# Patient Record
Sex: Female | Born: 1950 | Race: White | Hispanic: No | Marital: Single | Smoking: Never smoker
Health system: Southern US, Community
[De-identification: ages and names within clinical notes are randomized; demographics above are authoritative.]

## PROBLEM LIST (undated history)

## (undated) DIAGNOSIS — I10 Essential (primary) hypertension: Secondary | ICD-10-CM

## (undated) DIAGNOSIS — N80109 Endometriosis of ovary, unspecified side, unspecified depth: Secondary | ICD-10-CM

## (undated) DIAGNOSIS — T7840XA Allergy, unspecified, initial encounter: Secondary | ICD-10-CM

## (undated) DIAGNOSIS — E785 Hyperlipidemia, unspecified: Secondary | ICD-10-CM

## (undated) DIAGNOSIS — C50911 Malignant neoplasm of unspecified site of right female breast: Secondary | ICD-10-CM

## (undated) DIAGNOSIS — C801 Malignant (primary) neoplasm, unspecified: Secondary | ICD-10-CM

## (undated) DIAGNOSIS — N801 Endometriosis of ovary: Secondary | ICD-10-CM

## (undated) HISTORY — DX: Malignant (primary) neoplasm, unspecified: C80.1

## (undated) HISTORY — PX: CHOLECYSTECTOMY: SHX55

## (undated) HISTORY — DX: Endometriosis of ovary, unspecified side, unspecified depth: N80.109

## (undated) HISTORY — DX: Essential (primary) hypertension: I10

## (undated) HISTORY — DX: Malignant neoplasm of unspecified site of right female breast: C50.911

## (undated) HISTORY — PX: BREAST SURGERY: SHX581

## (undated) HISTORY — DX: Endometriosis of ovary: N80.1

## (undated) HISTORY — DX: Allergy, unspecified, initial encounter: T78.40XA

## (undated) HISTORY — PX: OOPHORECTOMY: SHX86

## (undated) HISTORY — DX: Hyperlipidemia, unspecified: E78.5

## (undated) HISTORY — PX: COLON SURGERY: SHX602

---

## 1999-11-18 ENCOUNTER — Other Ambulatory Visit: Admission: RE | Admit: 1999-11-18 | Discharge: 1999-11-18 | Payer: Self-pay | Admitting: Obstetrics and Gynecology

## 1999-11-25 ENCOUNTER — Other Ambulatory Visit: Admission: RE | Admit: 1999-11-25 | Discharge: 1999-11-25 | Payer: Self-pay | Admitting: General Surgery

## 1999-12-06 DIAGNOSIS — C50911 Malignant neoplasm of unspecified site of right female breast: Secondary | ICD-10-CM

## 1999-12-06 HISTORY — DX: Malignant neoplasm of unspecified site of right female breast: C50.911

## 1999-12-09 ENCOUNTER — Encounter (HOSPITAL_BASED_OUTPATIENT_CLINIC_OR_DEPARTMENT_OTHER): Payer: Self-pay | Admitting: General Surgery

## 1999-12-10 ENCOUNTER — Ambulatory Visit (HOSPITAL_COMMUNITY): Admission: RE | Admit: 1999-12-10 | Discharge: 1999-12-10 | Payer: Self-pay | Admitting: General Surgery

## 1999-12-10 ENCOUNTER — Encounter (INDEPENDENT_AMBULATORY_CARE_PROVIDER_SITE_OTHER): Payer: Self-pay | Admitting: *Deleted

## 1999-12-29 ENCOUNTER — Encounter (HOSPITAL_BASED_OUTPATIENT_CLINIC_OR_DEPARTMENT_OTHER): Payer: Self-pay | Admitting: General Surgery

## 1999-12-29 ENCOUNTER — Inpatient Hospital Stay (HOSPITAL_COMMUNITY): Admission: RE | Admit: 1999-12-29 | Discharge: 1999-12-30 | Payer: Self-pay | Admitting: General Surgery

## 1999-12-29 ENCOUNTER — Encounter (INDEPENDENT_AMBULATORY_CARE_PROVIDER_SITE_OTHER): Payer: Self-pay | Admitting: *Deleted

## 2000-01-14 ENCOUNTER — Encounter: Admission: RE | Admit: 2000-01-14 | Discharge: 2000-01-14 | Payer: Self-pay | Admitting: General Surgery

## 2000-02-01 ENCOUNTER — Ambulatory Visit (HOSPITAL_COMMUNITY): Admission: RE | Admit: 2000-02-01 | Discharge: 2000-02-01 | Payer: Self-pay | Admitting: Oncology

## 2000-02-01 ENCOUNTER — Encounter: Payer: Self-pay | Admitting: Oncology

## 2000-04-09 ENCOUNTER — Ambulatory Visit (HOSPITAL_COMMUNITY): Admission: RE | Admit: 2000-04-09 | Discharge: 2000-04-09 | Payer: Self-pay | Admitting: Oncology

## 2000-05-10 ENCOUNTER — Encounter: Admission: RE | Admit: 2000-05-10 | Discharge: 2000-08-08 | Payer: Self-pay | Admitting: Radiation Oncology

## 2000-06-09 ENCOUNTER — Ambulatory Visit (HOSPITAL_COMMUNITY): Admission: RE | Admit: 2000-06-09 | Discharge: 2000-06-09 | Payer: Self-pay | Admitting: General Surgery

## 2001-01-05 ENCOUNTER — Other Ambulatory Visit: Admission: RE | Admit: 2001-01-05 | Discharge: 2001-01-05 | Payer: Self-pay | Admitting: Obstetrics and Gynecology

## 2002-01-02 ENCOUNTER — Encounter: Payer: Self-pay | Admitting: Plastic Surgery

## 2002-01-04 ENCOUNTER — Inpatient Hospital Stay (HOSPITAL_COMMUNITY): Admission: RE | Admit: 2002-01-04 | Discharge: 2002-01-06 | Payer: Self-pay | Admitting: Plastic Surgery

## 2002-01-04 ENCOUNTER — Encounter (INDEPENDENT_AMBULATORY_CARE_PROVIDER_SITE_OTHER): Payer: Self-pay | Admitting: Specialist

## 2002-03-11 ENCOUNTER — Other Ambulatory Visit: Admission: RE | Admit: 2002-03-11 | Discharge: 2002-03-11 | Payer: Self-pay | Admitting: Obstetrics and Gynecology

## 2003-06-16 ENCOUNTER — Other Ambulatory Visit: Admission: RE | Admit: 2003-06-16 | Discharge: 2003-06-16 | Payer: Self-pay | Admitting: Obstetrics and Gynecology

## 2004-08-06 ENCOUNTER — Other Ambulatory Visit: Admission: RE | Admit: 2004-08-06 | Discharge: 2004-08-06 | Payer: Self-pay | Admitting: Obstetrics and Gynecology

## 2005-02-15 ENCOUNTER — Ambulatory Visit: Payer: Self-pay | Admitting: Oncology

## 2005-08-22 ENCOUNTER — Other Ambulatory Visit: Admission: RE | Admit: 2005-08-22 | Discharge: 2005-08-22 | Payer: Self-pay | Admitting: Obstetrics and Gynecology

## 2006-02-13 ENCOUNTER — Ambulatory Visit: Payer: Self-pay | Admitting: Oncology

## 2006-04-13 ENCOUNTER — Ambulatory Visit: Payer: Self-pay | Admitting: Oncology

## 2006-04-17 LAB — LIPID PANEL
HDL: 53 mg/dL (ref >39)
LDL Cholesterol: 130 mg/dL — ABNORMAL HIGH (ref 0–99)

## 2006-11-04 ENCOUNTER — Encounter: Payer: Self-pay | Admitting: Internal Medicine

## 2006-11-04 LAB — CONVERTED CEMR LAB

## 2007-02-07 ENCOUNTER — Ambulatory Visit: Payer: Self-pay | Admitting: Oncology

## 2007-02-12 LAB — CBC WITH DIFFERENTIAL/PLATELET
BASO%: 0.4 % (ref 0.0–2.0)
Eosinophils Absolute: 0.1 10*3/uL (ref 0.0–0.5)
HCT: 38 % (ref 34.8–46.6)
MCHC: 35.7 g/dL (ref 32.0–36.0)
MONO#: 0.4 10*3/uL (ref 0.1–0.9)
NEUT#: 2.7 10*3/uL (ref 1.5–6.5)
NEUT%: 55.8 % (ref 39.6–76.8)
RBC: 4.14 10*6/uL (ref 3.70–5.32)
WBC: 4.9 10*3/uL (ref 3.9–10.0)
lymph#: 1.6 10*3/uL (ref 0.9–3.3)

## 2007-02-12 LAB — COMPREHENSIVE METABOLIC PANEL
ALT: 24 U/L (ref 0–35)
CO2: 26 mEq/L (ref 19–32)
Calcium: 9.7 mg/dL (ref 8.4–10.5)
Chloride: 104 mEq/L (ref 96–112)
Sodium: 141 mEq/L (ref 135–145)
Total Protein: 7.4 g/dL (ref 6.0–8.3)

## 2007-02-12 LAB — LACTATE DEHYDROGENASE: LDH: 153 U/L (ref 94–250)

## 2007-04-17 ENCOUNTER — Ambulatory Visit: Payer: Self-pay | Admitting: Internal Medicine

## 2007-05-03 ENCOUNTER — Encounter: Payer: Self-pay | Admitting: Internal Medicine

## 2007-05-03 DIAGNOSIS — F4320 Adjustment disorder, unspecified: Secondary | ICD-10-CM | POA: Insufficient documentation

## 2007-05-03 DIAGNOSIS — I10 Essential (primary) hypertension: Secondary | ICD-10-CM

## 2007-05-03 DIAGNOSIS — E785 Hyperlipidemia, unspecified: Secondary | ICD-10-CM

## 2007-05-03 DIAGNOSIS — R739 Hyperglycemia, unspecified: Secondary | ICD-10-CM

## 2007-05-03 DIAGNOSIS — Z853 Personal history of malignant neoplasm of breast: Secondary | ICD-10-CM

## 2007-06-12 ENCOUNTER — Ambulatory Visit: Payer: Self-pay | Admitting: Internal Medicine

## 2007-06-12 LAB — CONVERTED CEMR LAB
ALT: 29 units/L (ref 0–35)
AST: 28 units/L (ref 0–37)
Albumin: 3.9 g/dL (ref 3.5–5.2)
Alkaline Phosphatase: 56 units/L (ref 39–117)
BUN: 14 mg/dL (ref 6–23)
Basophils Absolute: 0 10*3/uL (ref 0.0–0.1)
Basophils Relative: 0.8 % (ref 0.0–1.0)
Bilirubin, Direct: 0.1 mg/dL (ref 0.0–0.3)
CO2: 30 meq/L (ref 19–32)
Calcium: 9.4 mg/dL (ref 8.4–10.5)
Chloride: 106 meq/L (ref 96–112)
Cholesterol: 233 mg/dL (ref 0–200)
Creatinine, Ser: 0.8 mg/dL (ref 0.4–1.2)
Direct LDL: 157.3 mg/dL
Eosinophils Absolute: 0.3 10*3/uL (ref 0.0–0.6)
Eosinophils Relative: 5.6 % — ABNORMAL HIGH (ref 0.0–5.0)
GFR calc Af Amer: 95 mL/min
GFR calc non Af Amer: 79 mL/min
Glucose, Bld: 98 mg/dL (ref 70–99)
HCT: 36.8 % (ref 36.0–46.0)
HDL: 45.1 mg/dL (ref >39.0)
Hemoglobin: 13.2 g/dL (ref 12.0–15.0)
Hgb A1c MFr Bld: 5.6 % (ref 4.6–6.0)
Lymphocytes Relative: 29.2 % (ref 12.0–46.0)
MCHC: 35.9 g/dL (ref 30.0–36.0)
MCV: 93.6 fL (ref 78.0–100.0)
Monocytes Absolute: 0.4 10*3/uL (ref 0.2–0.7)
Monocytes Relative: 9.1 % (ref 3.0–11.0)
Neutro Abs: 2.7 10*3/uL (ref 1.4–7.7)
Neutrophils Relative %: 55.3 % (ref 43.0–77.0)
Platelets: 245 10*3/uL (ref 150–400)
Potassium: 3.7 meq/L (ref 3.5–5.1)
RBC: 3.93 M/uL (ref 3.87–5.11)
RDW: 12.9 % (ref 11.5–14.6)
Sodium: 141 meq/L (ref 135–145)
TSH: 1.9 microintl units/mL (ref 0.35–5.50)
Total Bilirubin: 0.9 mg/dL (ref 0.3–1.2)
Total CHOL/HDL Ratio: 5.2
Total Protein: 7.1 g/dL (ref 6.0–8.3)
Triglycerides: 159 mg/dL — ABNORMAL HIGH (ref 0–149)
VLDL: 32 mg/dL (ref 0–40)
Vit D, 1,25-Dihydroxy: 14 — ABNORMAL LOW (ref 20–57)
WBC: 4.8 10*3/uL (ref 4.5–10.5)

## 2007-06-25 ENCOUNTER — Ambulatory Visit: Payer: Self-pay | Admitting: Internal Medicine

## 2007-06-25 DIAGNOSIS — E559 Vitamin D deficiency, unspecified: Secondary | ICD-10-CM | POA: Insufficient documentation

## 2007-09-10 ENCOUNTER — Encounter: Payer: Self-pay | Admitting: Internal Medicine

## 2007-09-21 ENCOUNTER — Ambulatory Visit: Payer: Self-pay | Admitting: Internal Medicine

## 2007-09-21 LAB — CONVERTED CEMR LAB
Cholesterol: 235 mg/dL (ref 0–200)
Direct LDL: 167.6 mg/dL
HDL: 39 mg/dL (ref >39.0)
Total CHOL/HDL Ratio: 6
Triglycerides: 208 mg/dL (ref 0–149)
VLDL: 42 mg/dL — ABNORMAL HIGH (ref 0–40)

## 2007-09-27 ENCOUNTER — Ambulatory Visit: Payer: Self-pay | Admitting: Internal Medicine

## 2008-01-02 ENCOUNTER — Ambulatory Visit: Payer: Self-pay | Admitting: Internal Medicine

## 2008-01-06 LAB — CONVERTED CEMR LAB
ALT: 23 units/L (ref 0–35)
Alkaline Phosphatase: 58 units/L (ref 39–117)
BUN: 13 mg/dL (ref 6–23)
CO2: 30 meq/L (ref 19–32)
Calcium: 9.9 mg/dL (ref 8.4–10.5)
GFR calc Af Amer: 83 mL/min
GFR calc non Af Amer: 69 mL/min
HDL: 50 mg/dL (ref >39.0)
Potassium: 5.3 meq/L — ABNORMAL HIGH (ref 3.5–5.1)
Total CHOL/HDL Ratio: 5
Total Protein: 6.8 g/dL (ref 6.0–8.3)
Triglycerides: 127 mg/dL (ref 0–149)
VLDL: 25 mg/dL (ref 0–40)

## 2008-01-10 ENCOUNTER — Ambulatory Visit: Payer: Self-pay | Admitting: Internal Medicine

## 2008-01-10 LAB — CONVERTED CEMR LAB
Cholesterol, target level: 200 mg/dL
HDL goal, serum: 40 mg/dL
LDL Goal: 130 mg/dL

## 2008-02-15 ENCOUNTER — Ambulatory Visit: Payer: Self-pay | Admitting: Oncology

## 2008-02-19 ENCOUNTER — Encounter: Payer: Self-pay | Admitting: Internal Medicine

## 2008-02-19 LAB — COMPREHENSIVE METABOLIC PANEL
ALT: 20 U/L (ref 0–35)
AST: 20 U/L (ref 0–37)
CO2: 26 mEq/L (ref 19–32)
Calcium: 9.4 mg/dL (ref 8.4–10.5)
Chloride: 104 mEq/L (ref 96–112)
Creatinine, Ser: 0.85 mg/dL (ref 0.40–1.20)
Potassium: 4.1 mEq/L (ref 3.5–5.3)
Sodium: 137 mEq/L (ref 135–145)
Total Protein: 7.2 g/dL (ref 6.0–8.3)

## 2008-02-19 LAB — CBC WITH DIFFERENTIAL/PLATELET
BASO%: 0.1 % (ref 0.0–2.0)
EOS%: 3.1 % (ref 0.0–7.0)
MCH: 32.2 pg (ref 26.0–34.0)
MCHC: 34.6 g/dL (ref 32.0–36.0)
MONO#: 0.5 10*3/uL (ref 0.1–0.9)
RBC: 4.08 10*6/uL (ref 3.70–5.32)
RDW: 14.1 % (ref 11.3–14.5)
WBC: 4.9 10*3/uL (ref 3.9–10.0)
lymph#: 1.6 10*3/uL (ref 0.9–3.3)

## 2008-02-25 ENCOUNTER — Encounter: Payer: Self-pay | Admitting: Internal Medicine

## 2008-04-17 ENCOUNTER — Ambulatory Visit: Payer: Self-pay | Admitting: Internal Medicine

## 2008-04-17 LAB — CONVERTED CEMR LAB
BUN: 12 mg/dL (ref 6–23)
CO2: 30 meq/L (ref 19–32)
Calcium: 9.3 mg/dL (ref 8.4–10.5)
Direct LDL: 152.5 mg/dL
Glucose, Bld: 98 mg/dL (ref 70–99)
Potassium: 3.6 meq/L (ref 3.5–5.1)
Sodium: 141 meq/L (ref 135–145)
Triglycerides: 153 mg/dL — ABNORMAL HIGH (ref 0–149)

## 2008-04-21 LAB — CONVERTED CEMR LAB: Vit D, 1,25-Dihydroxy: 19 — ABNORMAL LOW (ref 30–89)

## 2008-04-29 ENCOUNTER — Ambulatory Visit: Payer: Self-pay | Admitting: Internal Medicine

## 2008-11-03 ENCOUNTER — Ambulatory Visit: Payer: Self-pay | Admitting: Internal Medicine

## 2008-11-03 LAB — CONVERTED CEMR LAB
ALT: 22 units/L (ref 0–35)
Direct LDL: 122.2 mg/dL
HDL: 65 mg/dL (ref >39.0)
Total Protein: 6.8 g/dL (ref 6.0–8.3)
Triglycerides: 78 mg/dL (ref 0–149)
VLDL: 16 mg/dL (ref 0–40)
Vit D, 1,25-Dihydroxy: 25 — ABNORMAL LOW (ref 30–89)

## 2008-11-10 ENCOUNTER — Ambulatory Visit: Payer: Self-pay | Admitting: Internal Medicine

## 2008-12-08 ENCOUNTER — Telehealth: Payer: Self-pay | Admitting: Internal Medicine

## 2008-12-08 ENCOUNTER — Ambulatory Visit: Payer: Self-pay | Admitting: Family Medicine

## 2008-12-08 DIAGNOSIS — J309 Allergic rhinitis, unspecified: Secondary | ICD-10-CM | POA: Insufficient documentation

## 2008-12-08 DIAGNOSIS — J45909 Unspecified asthma, uncomplicated: Secondary | ICD-10-CM | POA: Insufficient documentation

## 2008-12-16 ENCOUNTER — Ambulatory Visit: Payer: Self-pay | Admitting: Internal Medicine

## 2008-12-16 DIAGNOSIS — J019 Acute sinusitis, unspecified: Secondary | ICD-10-CM

## 2008-12-16 DIAGNOSIS — H66019 Acute suppurative otitis media with spontaneous rupture of ear drum, unspecified ear: Secondary | ICD-10-CM

## 2008-12-16 DIAGNOSIS — T700XXA Otitic barotrauma, initial encounter: Secondary | ICD-10-CM

## 2008-12-22 ENCOUNTER — Telehealth: Payer: Self-pay | Admitting: Internal Medicine

## 2009-01-19 ENCOUNTER — Encounter: Payer: Self-pay | Admitting: Internal Medicine

## 2009-02-12 ENCOUNTER — Ambulatory Visit: Payer: Self-pay | Admitting: Oncology

## 2009-02-16 ENCOUNTER — Encounter: Payer: Self-pay | Admitting: Internal Medicine

## 2009-02-16 ENCOUNTER — Encounter: Payer: Self-pay | Admitting: *Deleted

## 2009-02-16 LAB — CBC WITH DIFFERENTIAL/PLATELET
EOS%: 2.2 % (ref 0.0–7.0)
Eosinophils Absolute: 0.1 10*3/uL (ref 0.0–0.5)
LYMPH%: 38.4 % (ref 14.0–49.7)
MCH: 33.4 pg (ref 25.1–34.0)
MCHC: 34.7 g/dL (ref 31.5–36.0)
MCV: 96.4 fL (ref 79.5–101.0)
MONO%: 8 % (ref 0.0–14.0)
NEUT#: 2.5 10*3/uL (ref 1.5–6.5)
Platelets: 199 10*3/uL (ref 145–400)
RBC: 3.9 10*6/uL (ref 3.70–5.45)

## 2009-02-16 LAB — COMPREHENSIVE METABOLIC PANEL
AST: 24 U/L (ref 0–37)
Alkaline Phosphatase: 56 U/L (ref 39–117)
Glucose, Bld: 124 mg/dL — ABNORMAL HIGH (ref 70–99)
Sodium: 139 mEq/L (ref 135–145)
Total Bilirubin: 0.5 mg/dL (ref 0.3–1.2)
Total Protein: 6.7 g/dL (ref 6.0–8.3)

## 2009-02-16 LAB — CONVERTED CEMR LAB: HCT: 37.6 %

## 2009-02-23 ENCOUNTER — Encounter: Payer: Self-pay | Admitting: Internal Medicine

## 2009-03-13 ENCOUNTER — Encounter: Payer: Self-pay | Admitting: *Deleted

## 2009-05-27 ENCOUNTER — Telehealth: Payer: Self-pay | Admitting: Internal Medicine

## 2009-06-03 ENCOUNTER — Ambulatory Visit: Payer: Self-pay | Admitting: Internal Medicine

## 2009-06-03 LAB — CONVERTED CEMR LAB
AST: 32 units/L (ref 0–37)
Alkaline Phosphatase: 55 units/L (ref 39–117)
BUN: 13 mg/dL (ref 6–23)
Bilirubin, Direct: 0 mg/dL (ref 0.0–0.3)
CO2: 27 meq/L (ref 19–32)
Chloride: 107 meq/L (ref 96–112)
Creatinine, Ser: 0.9 mg/dL (ref 0.4–1.2)
Direct LDL: 143.3 mg/dL
Glucose, Bld: 118 mg/dL — ABNORMAL HIGH (ref 70–99)
TSH: 1.7 microintl units/mL (ref 0.35–5.50)
Total Bilirubin: 0.9 mg/dL (ref 0.3–1.2)
Total CHOL/HDL Ratio: 4
Triglycerides: 105 mg/dL (ref 0.0–149.0)

## 2009-06-25 ENCOUNTER — Ambulatory Visit: Payer: Self-pay | Admitting: Internal Medicine

## 2009-09-14 ENCOUNTER — Encounter: Payer: Self-pay | Admitting: Internal Medicine

## 2009-12-17 ENCOUNTER — Ambulatory Visit: Payer: Self-pay | Admitting: Internal Medicine

## 2009-12-17 LAB — CONVERTED CEMR LAB
Albumin: 4 g/dL (ref 3.5–5.2)
Basophils Relative: 0.5 % (ref 0.0–3.0)
Bilirubin Urine: NEGATIVE
CO2: 26 meq/L (ref 19–32)
CRP, High Sensitivity: 0.6 (ref 0.00–5.00)
Chloride: 103 meq/L (ref 96–112)
Cholesterol: 213 mg/dL — ABNORMAL HIGH (ref 0–200)
Creatinine, Ser: 0.9 mg/dL (ref 0.4–1.2)
Direct LDL: 132 mg/dL
Eosinophils Absolute: 0.2 10*3/uL (ref 0.0–0.7)
HCT: 37.3 % (ref 36.0–46.0)
Hemoglobin: 12.5 g/dL (ref 12.0–15.0)
Lymphs Abs: 1.9 10*3/uL (ref 0.7–4.0)
MCHC: 33.5 g/dL (ref 30.0–36.0)
MCV: 99.7 fL (ref 78.0–100.0)
Monocytes Absolute: 0.4 10*3/uL (ref 0.1–1.0)
Neutro Abs: 2.6 10*3/uL (ref 1.4–7.7)
RBC: 3.74 M/uL — ABNORMAL LOW (ref 3.87–5.11)
Sodium: 136 meq/L (ref 135–145)
Total CHOL/HDL Ratio: 3
Total Protein: 7 g/dL (ref 6.0–8.3)
Urobilinogen, UA: 0.2

## 2009-12-29 ENCOUNTER — Ambulatory Visit: Payer: Self-pay | Admitting: Internal Medicine

## 2009-12-29 ENCOUNTER — Other Ambulatory Visit: Admission: RE | Admit: 2009-12-29 | Discharge: 2009-12-29 | Payer: Self-pay | Admitting: Internal Medicine

## 2010-02-18 ENCOUNTER — Ambulatory Visit: Payer: Self-pay | Admitting: Oncology

## 2010-02-22 ENCOUNTER — Encounter: Payer: Self-pay | Admitting: Internal Medicine

## 2010-02-22 LAB — COMPREHENSIVE METABOLIC PANEL
ALT: 27 U/L (ref 0–35)
Albumin: 4.1 g/dL (ref 3.5–5.2)
Alkaline Phosphatase: 52 U/L (ref 39–117)
CO2: 25 mEq/L (ref 19–32)
Glucose, Bld: 101 mg/dL — ABNORMAL HIGH (ref 70–99)
Potassium: 4 mEq/L (ref 3.5–5.3)
Sodium: 137 mEq/L (ref 135–145)
Total Protein: 6.7 g/dL (ref 6.0–8.3)

## 2010-02-22 LAB — CBC WITH DIFFERENTIAL/PLATELET
BASO%: 0.4 % (ref 0.0–2.0)
Eosinophils Absolute: 0.1 10*3/uL (ref 0.0–0.5)
MCHC: 35.3 g/dL (ref 31.5–36.0)
MONO#: 0.4 10*3/uL (ref 0.1–0.9)
NEUT#: 2.3 10*3/uL (ref 1.5–6.5)
RBC: 3.71 10*6/uL (ref 3.70–5.45)
RDW: 13.7 % (ref 11.2–14.5)
WBC: 4.6 10*3/uL (ref 3.9–10.3)

## 2010-02-22 LAB — LACTATE DEHYDROGENASE: LDH: 169 U/L (ref 94–250)

## 2010-03-01 ENCOUNTER — Encounter: Payer: Self-pay | Admitting: Internal Medicine

## 2010-08-10 ENCOUNTER — Telehealth: Payer: Self-pay | Admitting: *Deleted

## 2010-09-15 ENCOUNTER — Encounter: Payer: Self-pay | Admitting: Internal Medicine

## 2010-11-09 ENCOUNTER — Telehealth: Payer: Self-pay | Admitting: *Deleted

## 2011-01-04 NOTE — Assessment & Plan Note (Signed)
Summary: cpx/mm   Vital Signs:  Patient profile:   60 year old female Menstrual status:  postmenopausal Height:      65.5 inches Weight:      145 pounds Pulse rate:   88 / minute BP sitting:   130 / 80  (left arm) Cuff size:   regular  Vitals Entered By: Romualdo Bolk, CMA (AAMA) (December 29, 2009 9:22 AM) CC: CPX- Discuss doing a pap   History of Present Illness: Ashley Hurley comes in today for   preventive visit .   Since last visit  here  there have been no major changes in health status    Mood Good  LIPIDS:  no se of niaspan. HT controlled without se of meds. Vit d  On high dose because low when not on this    no se .   Resp: no sig resp problem  Preventive Care Screening  Mammogram:    Date:  09/04/2009    Results:  normal    Preventive Screening-Counseling & Management  Alcohol-Tobacco     Alcohol drinks/day: <1     Alcohol type: wine     Smoking Status: never  Caffeine-Diet-Exercise     Caffeine use/day: 0     Does Patient Exercise: yes     Type of exercise: walk, wts  Hep-HIV-STD-Contraception     Sun Exposure-Excessive: no  Safety-Violence-Falls     Seat Belt Use: yes     Firearms in the Home: no firearms in the home     Smoke Detectors: yes  Contraindications/Deferment of Procedures/Staging:    Test/Procedure: Colonoscopy    Reason for deferment: patient declined   Current Medications (verified): 1)  Lexapro 20 Mg Tabs (Escitalopram Oxalate) .Marland Kitchen.. 1 By Mouth Once Daily Dr. Wallace Cullens 2)  Prinzide 10-12.5 Mg Tabs (Lisinopril-Hydrochlorothiazide) .Marland Kitchen.. 1 By Mouth Once Daily 3)  Co Q-10 120 Mg  Caps (Coenzyme Q10) .... Two Qam and One Hs 4)  Garlic 400 Mg  Tbec (Garlic) .... Once Daily 5)  Calcium 600 1500 Mg  Tabs (Calcium Carbonate) .... Once Daily 6)  Multivitamins   Tabs (Multiple Vitamin) .... Once Daily 7)  Niaspan 500 Mg  Tbcr (Niacin (Antihyperlipidemic)) .... 3  By Mouth Hs or As Directed 8)  Drisdol 09811 Unit  Caps (Ergocalciferol)  .Marland Kitchen.. 1 By Mouth Q Week  or As Directed  Allergies (verified): 1)  ! Asa  Past History:  Past medical, surgical, family and social histories (including risk factors) reviewed, and no changes noted (except as noted below).  Past Medical History: Reviewed history from 12/16/2008 and no changes required. Hyperlipidemia Hypertension Breast cancer, hx of r mastectomy radiation and chemo  IIBT3N0 Jan 2001 "Chocolate Cyst" g2p2  [facility] Allergic rhinitis  Consults Dr. Marlena Clipper   Past Surgical History: Reviewed history from 11/10/2008 and no changes required. Mastectomy r Oophorectomy Breast Biopsy   Past History:  Care Management: OB/Gyn: [facility]-Hasn't seen in a few years Hematology/Oncology: [facility]  Family History: Reviewed history from 12/16/2008 and no changes required. Family History Breast cancer 1st degree relative <50 sis Family History of CAD Female 1st degree relative <60 mom d 52 Family History Diabetes 1st degree relative mom Family History Hypertension Family History Other cancer pancreas father 82  father took a statin     Social History: Reviewed history from 12/16/2008 and no changes required. 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   Physical  Exam General Appearance: well developed, well nourished, no acute distress Eyes: conjunctiva and lids normal, PERRLA, EOMI,  WNL Ears, Nose, Mouth, Throat: TM clear, nares clear, oral exam WNL Neck: supple, no lymphadenopathy, no thyromegaly, no JVD Respiratory: clear to auscultation and percussion, respiratory effort normal Cardiovascular: regular rate and rhythm, S1-S2, no murmur, rub or gallop, no bruits, peripheral pulses normal and symmetric, no cyanosis, clubbing, edema or varicosities Chest: right breast  with old skin changes and reconstruction  no adenopathy , no masses tendderness nipple discharge   Gastrointestinal: soft, non-tender; no hepatosplenomegaly, masses; active bowel sounds all quadrants, well healead scars.  guaiac negative stool; no masses, tenderness.    tags  of  hemorrhoids  Genitourinary: no vaginal discharge, lesions; no masses or tenderness no masses  Lymphatic: no cervical, axillary or inguinal adenopathy Musculoskeletal: gait normal, muscle tone and strength WNL, no joint swelling, effusions, discoloration, crepitus  Skin: clear, good turgor, color WNL, no rashes, lesions, or ulcerations Neurologic: normal mental status, normal reflexes, normal strength, sensation, and motion Psychiatric: alert; oriented to person, place and time Other Exam:  EKG NSR     Impression & Recommendations:  Problem # 1:  HEALTH MAINTENANCE EXAM, ADULT (ICD-V70.0)  continue  healthy lifestyle   counseled about colonoscopy  declined for now  stool card given.  Orders: EKG w/ Interpretation (93000)  Problem # 2:  ROUTINE GYNECOLOGICAL EXAM (ICD-V72.31)  PAP done   Orders: Pap Smear, Thin Prep ( Collection of) (Z3086)  Problem # 3:  HYPERTENSION (ICD-401.9)  Her updated medication list for this problem includes:    Prinzide 10-12.5 Mg Tabs (Lisinopril-hydrochlorothiazide) .Marland Kitchen... 1 by mouth once daily  BP today: 130/80 Prior BP: 100/74 (06/25/2009)  Prior 10 Yr Risk Heart  Disease: Not enough information (01/10/2008)  Labs Reviewed: K+: 5.0 (12/17/2009) Creat: : 0.9 (12/17/2009)   Chol: 213 (12/17/2009)   HDL: 63.20 (12/17/2009)   LDL: DEL (11/03/2008)   TG: 83.0 (12/17/2009)  Orders: EKG w/ Interpretation (93000)  Problem # 4:  HYPERLIPIDEMIA (ICD-272.4)  Her updated medication list for this problem includes:    Niaspan 500 Mg Tbcr (Niacin (antihyperlipidemic)) .Marland KitchenMarland KitchenMarland KitchenMarland Kitchen 3  by mouth hs or as directed  Labs Reviewed: SGOT: 27 (12/17/2009)   SGPT: 29 (12/17/2009)  Lipid Goals: Chol Goal: 200 (01/10/2008)   HDL Goal: 40 (01/10/2008)   LDL Goal: 130 (01/10/2008)   TG Goal: 150 (01/10/2008)  Prior 10 Yr Risk Heart Disease: Not enough information (01/10/2008)   HDL:63.20 (12/17/2009), 56.50 (06/03/2009)  LDL:DEL (11/03/2008), DEL (04/17/2008)  Chol:213 (12/17/2009), 223 (06/03/2009)  Trig:83.0 (12/17/2009), 105.0 (06/03/2009)  Orders: EKG w/ Interpretation (93000)  Problem # 5:  BREAST CANCER, HX OF (ICD-V10.3) Assessment: Comment Only  Problem # 6:  borderline  bg  disc about niaspan  ok to continu.   Complete Medication List: 1)  Lexapro 20 Mg Tabs (Escitalopram oxalate) .Marland Kitchen.. 1 by mouth once daily dr. Wallace Cullens 2)  Prinzide 10-12.5 Mg Tabs (Lisinopril-hydrochlorothiazide) .Marland Kitchen.. 1 by mouth once daily 3)  Co Q-10 120 Mg  Caps (Coenzyme q10) .... Two qam and one hs 4)  Garlic 400 Mg Tbec (Garlic) .... Once daily 5)  Calcium 600 1500 Mg Tabs (Calcium carbonate) .... Once daily 6)  Multivitamins Tabs (Multiple vitamin) .... Once daily 7)  Niaspan 500 Mg Tbcr (Niacin (antihyperlipidemic)) .... 3  by mouth hs or as directed 8)  Drisdol 16109 Unit Caps (Ergocalciferol) .Marland Kitchen.. 1 by mouth q week  or as directed  Other Orders: Admin 1st Vaccine (60454) Flu Vaccine 20yrs + (09811) Tdap => 11yrs IM (91478) Admin of Any Addtl Vaccine (29562)  Patient Instructions: 1)  continue lifestyle intervention . 2)  EKG is normal  3)  Rec colonoscopy but at least do stool  cards for screeniing.  4)  rov in 1 year with labs or as needed.     Flu Vaccine Consent Questions     Do you have a history of severe allergic reactions to this vaccine? no    Any prior history of allergic reactions to egg and/or gelatin? no    Do you have a sensitivity to the preservative Thimersol? no    Do you have a past history of Guillan-Barre Syndrome? no    Do you currently have an acute febrile illness? no    Have you ever had a severe reaction to latex? no    Vaccine information given and explained to patient? yes    Are you currently pregnant? no    Lot Number:[id number]   Exp Date:06/03/2010   Site Given  Left Deltoid IMbflu Romualdo Bolk, CMA (AAMA)  December 29, 2009 9:27 AM   Immunizations Administered:  Tetanus Vaccine:    Vaccine Type: Tdap    Site: left deltoid    Mfr: GlaxoSmithKline    Dose: 0.5 ml    Route: IM    Given by: Romualdo Bolk, CMA (AAMA)    Exp. Date: 01/30/2012    Lot #: [id number]

## 2011-01-04 NOTE — Letter (Signed)
[facility]   Imported By: Maryln Gottron 03/18/2010 14:58:22  _____________________________________________________________________  External Attachment:    Type:   Image     Comment:   External Document

## 2011-01-04 NOTE — Progress Notes (Signed)
Summary: refill  Phone Note From Pharmacy   Caller: [facility]  [address] [phone]* Reason for Call: Needs renewal Details for Reason: lexapro Initial call taken by: Romualdo Bolk, CMA [facility]),  November 09, 2010 4:45 PM  Follow-up for Phone Call        rx sent to pharmacy Follow-up by: Romualdo Bolk, CMA [facility]),  November 09, 2010 4:45 PM    Prescriptions: LEXAPRO 20 MG TABS (ESCITALOPRAM OXALATE) 1 by mouth once daily Dr. Wallace Cullens  #30 x 0   Entered by:   Romualdo Bolk, CMA (AAMA)   Authorized by:   Madelin Headings MD   Signed by:   Romualdo Bolk, CMA (AAMA) on 11/09/2010   Method used:   [facility]ronically to        [facility]  [address] [phone]* (retail)       [address]       [place], [place]  [address]       Ph: [phone] or [phone]       Fax: [phone]   RxID:   [id number]

## 2011-01-04 NOTE — Progress Notes (Signed)
  Phone Note Call from Patient   Caller: Patient Call For: Madelin Headings MD Summary of Call: Pt is asking for Lexapro 20 mg [phone] Initial call taken by: Lynann Beaver CMA,  August 10, 2010 10:56 AM  Follow-up for Phone Call        ok x 6  Follow-up by: Madelin Headings MD,  August 10, 2010 11:03 AM  Additional Follow-up for Phone Call Additional follow up Details #1::        Rx sent to pharmacy Additional Follow-up by: Romualdo Bolk, CMA [facility]),  August 10, 2010 12:34 PM    Prescriptions: LEXAPRO 20 MG TABS (ESCITALOPRAM OXALATE) 1 by mouth once daily Dr. Wallace Cullens  #30 x 5   Entered and Authorized by:   Madelin Headings MD   Signed by:   Madelin Headings MD on 08/10/2010   Method used:   [facility]ically [facility]        [facility]  [address] [phone]* (retail)       [address]       [place], [place]  [address]       Ph: [phone] or [phone]       Fax: [phone]   RxID:   [id number]

## 2011-01-06 ENCOUNTER — Other Ambulatory Visit: Payer: Self-pay | Admitting: *Deleted

## 2011-01-06 DIAGNOSIS — E785 Hyperlipidemia, unspecified: Secondary | ICD-10-CM

## 2011-01-06 MED ORDER — NIACIN ER (ANTIHYPERLIPIDEMIC) 500 MG PO TBCR: EXTENDED_RELEASE_TABLET | ORAL | Status: DC

## 2011-01-06 NOTE — Telephone Encounter (Signed)
Rx called in 

## 2011-01-10 ENCOUNTER — Encounter: Payer: Self-pay | Admitting: Internal Medicine

## 2011-01-24 ENCOUNTER — Other Ambulatory Visit: Payer: Self-pay | Admitting: Internal Medicine

## 2011-01-24 ENCOUNTER — Encounter (INDEPENDENT_AMBULATORY_CARE_PROVIDER_SITE_OTHER): Payer: Self-pay | Admitting: *Deleted

## 2011-01-24 ENCOUNTER — Other Ambulatory Visit: Payer: BC Managed Care – PPO

## 2011-01-24 DIAGNOSIS — E785 Hyperlipidemia, unspecified: Secondary | ICD-10-CM

## 2011-01-24 DIAGNOSIS — Z0000 Encounter for general adult medical examination without abnormal findings: Secondary | ICD-10-CM

## 2011-01-24 LAB — BASIC METABOLIC PANEL
BUN: 14 mg/dL (ref 6–23)
Calcium: 9.6 mg/dL (ref 8.4–10.5)
Creatinine, Ser: 0.8 mg/dL (ref 0.4–1.2)
GFR: 78.71 mL/min (ref >60.00)
Glucose, Bld: 97 mg/dL (ref 70–99)
Potassium: 4.8 mEq/L (ref 3.5–5.1)

## 2011-01-24 LAB — URINALYSIS
Hgb urine dipstick: NEGATIVE
Ketones, ur: NEGATIVE
Urine Glucose: NEGATIVE
Urobilinogen, UA: 0.2 (ref 0.0–1.0)

## 2011-01-24 LAB — CBC WITH DIFFERENTIAL/PLATELET
Basophils Absolute: 0 10*3/uL (ref 0.0–0.1)
HCT: 37.9 % (ref 36.0–46.0)
Lymphs Abs: 2.1 10*3/uL (ref 0.7–4.0)
Monocytes Relative: 7.8 % (ref 3.0–12.0)
Neutrophils Relative %: 61.1 % (ref 43.0–77.0)
Platelets: 243 10*3/uL (ref 150.0–400.0)
RDW: 14.1 % (ref 11.5–14.6)

## 2011-01-24 LAB — LDL CHOLESTEROL, DIRECT: Direct LDL: 155.8 mg/dL

## 2011-01-24 LAB — LIPID PANEL
Cholesterol: 222 mg/dL — ABNORMAL HIGH (ref 0–200)
HDL: 52 mg/dL (ref >39.00)
VLDL: 20.8 mg/dL (ref 0.0–40.0)

## 2011-01-24 LAB — TSH: TSH: 2.25 u[IU]/mL (ref 0.35–5.50)

## 2011-01-24 LAB — HEPATIC FUNCTION PANEL
AST: 28 U/L (ref 0–37)
Total Bilirubin: 0.6 mg/dL (ref 0.3–1.2)

## 2011-02-02 ENCOUNTER — Encounter: Payer: Self-pay | Admitting: Internal Medicine

## 2011-02-02 ENCOUNTER — Ambulatory Visit (INDEPENDENT_AMBULATORY_CARE_PROVIDER_SITE_OTHER): Payer: BC Managed Care – PPO | Admitting: Internal Medicine

## 2011-02-02 VITALS — BP 120/80 | HR 60 | Ht 65.5 in | Wt 145.0 lb

## 2011-02-02 DIAGNOSIS — I10 Essential (primary) hypertension: Secondary | ICD-10-CM

## 2011-02-02 DIAGNOSIS — F4320 Adjustment disorder, unspecified: Secondary | ICD-10-CM

## 2011-02-02 DIAGNOSIS — Z853 Personal history of malignant neoplasm of breast: Secondary | ICD-10-CM

## 2011-02-02 DIAGNOSIS — Z0000 Encounter for general adult medical examination without abnormal findings: Secondary | ICD-10-CM | POA: Insufficient documentation

## 2011-02-02 DIAGNOSIS — R7309 Other abnormal glucose: Secondary | ICD-10-CM

## 2011-02-02 DIAGNOSIS — Z1211 Encounter for screening for malignant neoplasm of colon: Secondary | ICD-10-CM

## 2011-02-02 DIAGNOSIS — E785 Hyperlipidemia, unspecified: Secondary | ICD-10-CM

## 2011-02-02 MED ORDER — NIACIN ER (ANTIHYPERLIPIDEMIC) 500 MG PO TBCR: EXTENDED_RELEASE_TABLET | ORAL | Status: DC

## 2011-02-02 NOTE — Assessment & Plan Note (Signed)
Doing very well on meds.

## 2011-02-02 NOTE — Assessment & Plan Note (Signed)
2001 utd on mammo.

## 2011-02-02 NOTE — Progress Notes (Signed)
  Subjective:    Patient ID: Ashley Hurley, female    DOB: [birth year], 60 y.o.   MRN: [id number]  HPI Patient comes in today  For preventive visit  And med  Refill  No major change in health status since last visit .   WU:[id number] and no se of meds LIPIDS:No se of meds  MOOD: good on meds  No falls .  Has smoke detector and wears seat belts.  No firearms. No excess sun exposure. Sees dentist regularly . No depression Past Medical History  Diagnosis Date  . Cancer     breast  . Hypertension   . Hyperlipidemia   . Allergy   . Chocolate cyst of ovary   . Breast cancer, right breast Jan 2001    IIBT3N0 radiation and chemo [facility]   Past Surgical History  Procedure Date  . Breast surgery     rt mastectomy  . Oophorectomy     reports that she has never smoked. She does not have any smokeless tobacco history on file. She reports that she drinks alcohol. She reports that she does not use illicit drugs. family history includes Cancer in her sister; Cancer (age of onset:76) in her father; Coronary artery disease (age of onset:59) in her mother; Diabetes in her mother; Hypertension in her sister and unspecified family member; and Stroke in her sister.      Review of Systems 12 system review neg except as per hpi.      Objective:   Physical Exam Physical Exam: Vital signs reviewed NFA:OZHY is a well-developed well-nourished alert cooperative  white female who appears her stated age in no acute distress.  HEENT: normocephalic  traumatic , Eyes: PERRL EOM's full, conjunctiva clear, Nares: paten,t no deformity discharge or tenderness., Ears: no deformity EAC's clear TMs with normal landmarks. Mouth: clear OP, no lesions, edema.  Moist mucous membranes. Dentition in adequate repair. NECK: supple without masses, thyromegaly or bruits. CHEST/PULM:  Clear to auscultation and percussion breath sounds equal no wheeze , rales or rhonchi. No chest wall deformities or tenderness. Breast:   Right  Changes from  Breast cancer rx   Axilla clear no discharge and no nodules   CV: PMI is nondisplaced, S1 S2 no gallops, murmurs, rubs. Peripheral pulses are full without delay.No JVD .  ABDOMEN: Bowel sounds normal nontender  No guard or rebound, no hepato splenomegal no CVA tenderness.  No hernia.Well healed scar lower abdomen  . No bruits Extremtities:  No clubbing cyanosis or edema, no acute joint swelling or redness no focal atrophy NEURO:  Oriented x3, cranial nerves 3-12 appear to be intact, no obvious focal weakness,gait within normal limits no abnormal reflexes or asymmetrical SKIN: No acute rashes normal turgor, color, no bruising or petechiae. PSYCH: Oriented, good eye contact, no obvious depression anxiety, cognition and judgment appear normal. Labs reviewed  LN:   No axillary  cervical  inguinal ln.        Assessment & Plan:  Preventive Health  Care VIsit  Cue for colonoscopy    Disc zostavax    Can check yearly for now as doing well and call for refill of meds.  taking otc vit d    .   Continue lifestyle intervention healthy eating and exercise .  HT LIPIDS Reactive mood Hyperglycemia: Better Hx of breast cancer  Right 2001

## 2011-02-02 NOTE — Assessment & Plan Note (Signed)
Better and normal this year.

## 2011-02-02 NOTE — Assessment & Plan Note (Signed)
COnsider zostavax  Colon referral  Continue lifestyle intervention healthy eating and exercise .   No change in meds   Vit d  OTC

## 2011-03-17 ENCOUNTER — Other Ambulatory Visit: Payer: Self-pay | Admitting: Internal Medicine

## 2011-04-19 NOTE — Assessment & Plan Note (Signed)
[facility] OFFICE NOTE   SEPTEMBER, MORMILE                      MRN:          [id number]  DATE:04/17/2007                            DOB:          [birth year]    CHIEF COMPLAINT:  New patient to establish, a couple of medical  problems.   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  Her  regular care has been provided by Dr. Cyndie Chime, her oncologist, who  had treated her for breast cancer diagnosed 7 years ago, treated with  mastectomy, chemo, and radiation, and Dr. Lowell Guitar at [facility].  She has generally done well after her breast cancer diagnosis, however,  has had a diagnosis of hypertension treated with Prinizide 10/12.5 a day  for a number of years.  When she was tried on a generic, it was not as  affective, so she is on brand and doing well.  Blood pressures usually  run 120/80.  She is also on Lexapro for mood 20 mg and doing well.  She  has had a diagnosis of elevated cholesterol around 250 from what I can  tell, treated with red yeast rice and give Niaspan by Dr. Cyndie Chime at  one point without significant side effect; however, she ran out and  since January has basically been on the red yeast rice.  Would prefer to  treat any lipid abnormalities with supplements as opposed to  prescription medicines, but we will evaluate as needed.  Her last lipids  were probably a year ago.  She has also had a history of an elevated  fasting blood sugar in the 100-110 range but no diagnosis of diabetes.  She tries to eat healthy and exercise regularly 35-45 minutes 2 times a  week.  She denies symptomatology that is problematic at this time and  needs a primary care physician to manage these other issues.   PAST MEDICAL HISTORY:  1. See data base.  2. Breast biopsy in 2000.  3. Mastectomy right 2001 with  constructive surgery 2002.  4. Oophorectomy in 1981 for a chocolate cyst.  5. High blood pressure and elevated cholesterol as above in HPI.  6. She is gravid 2, para 2, last Pap smear December 2007.  LMP 2000.      Mammogram October 2007.  7. Her tetanus shot was more than 10 years ago, and she has never had      a colonoscopy.   MEDICATIONS:  1. Prinizide brand 10/12.5, 1 p.o. daily.  2. Lexapro 20 mg daily.   DRUG ALLERGIES:  ASPIRIN.   FAMILY HISTORY:  Father died of pancreatic cancer 42-35 years of age.  Mother died of an MI at age 84.  She did have type 2 diabetes.  A sister  had type 2 diabetes, had elevated blood pressure, died of a CVA at age  36.  A sister had breast cancer in her 82s.  Negative family history of  osteoporosis or thyroid disease or colon cancer.   SOCIAL  HISTORY:  Household of 2.  Two dogs and a cat.  Alcohol, 4-5  weekly at most.  Negative tobacco.  Exercises as above.  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   REVIEW OF SYSTEMS:  Negative for chest pain, shortness of breath,  significant GI or GU problems.  She does have some mild allergic  symptoms with postnasal drainage in the season but prefers not to take  medication at present.   OBJECTIVE:  VITAL SIGNS:  Height 5 feet 5.25, weight 137, pulse 80 and  regular, blood pressure 120/82.  GENERAL:  WD, WN, healthy-appearing, middle-aged lady in no acute  distress who feels well.  HEENT:  Normocephalic, TMs clear.  Eyes:  PERRLA.  Sclerae nonicteric.  OP clear.  Nares slight congestion.  NECK:  Supple without masses, thyromegaly, or bruit.  CHEST:  CTAB.  CARDIAC:  S1, S2, no murmurs.  Peripheral pulses present without delay,  negative CCE.  No bruits are noted.  ABDOMEN:  Soft without organomegaly, guarding, or rebound.  NEUROLOGIC:  Grossly intact.  SKIN:  Nonicteric.   IMPRESSION:  1. Hypertension appears to be  well-controlled.  2. Hyperlipidemia by history and using red yeast rice at present.      Need more information to advise her further.  3. Status post breast cancer without recurrence 7 years.  4. History of slightly elevated fasting blood sugar with a family      history of type 2 diabetes.  5. Allergic rhinitis, mild, symptomatic treatment if needed.  6. Family history of coronary artery disease, diabetes, and breast      cancer.   PLAN:  Discussed above and will maintain her medications, check for  fasting labs.  She will contact us about a screening colonoscopy when  she is ready.  In the future, we told her that she will need a TDAP,  perhaps at followup visit.  We will do fasting labs at followup visit  and plan as above.     Neta Mends. Panosh, MD  Electronically Signed    WKP/MedQ  DD: 04/17/2007  DT: 04/17/2007  Job #: [id number]

## 2011-04-22 NOTE — Op Note (Signed)
[facility]  Patient:    Ashley Hurley                       MRN: [id number] Proc. Date: 12/29/99 Adm. Date:  [id number] Attending:  Sonda Primes CC:         Mardene Celeste. Lurene Shadow, M.D. (2 copies)                           Operative Report  PREOPERATIVE DIAGNOSIS:  Poor venous access and right-sided breast cancer.  POSTOPERATIVE DIAGNOSIS:  Poor venous access and right-sided breast cancer.  PROCEDURE:  Implantation of Port-A-Cath.  SURGEON:  Mardene Celeste. Lurene Shadow, M.D.  ASSISTANT:  Damita Lack, P.A. student.  ANESTHESIA:  General.  INDICATIONS:  This patient is a 60 year old woman having just undergone a right-sided modified radical mastectomy for carcinoma of the right breast.  The  mastectomy operation was dictated in a separate note.  She will require placement of a Port-A-Cath for facilitation of chemotherapy.  DESCRIPTION OF PROCEDURE:  With the patient then positioned supinely and then placed in the Trendelenburg position, her head and neck were turned to the right. The left anterior chest and neck were prepped and draped to be included in a sterile operative field.  I made several attempts at a left-sided subclavian stick, but because of obstruction of what appeared to be a first rib, a clear subclavian stick could not be made into the left subclavian vein.  I then chose to make an  internal jugular stick on the left side, and the jugular vein was easily accessed. A guide wire was threaded in to the central venous system and positioned in the  right atrium.  This was confirmed under fluoroscopic guidance.  I then created  pocket on the left anterior chest wall, and from the pocket, I created a tunnel up to the neck incision.  A Silastic catheter was pulled through from the pocket up to the neck incision.  I then used a size #10 Cook introducer and dilator over the  guide wire and inserted it into the central venous  system, positioning it in the superior vena cava under fluoroscopic guidance.  The dilator was removed, and the Silastic catheter was threaded into the central venous system and positioned at the atrial vena caval border.  Inflow heparinized saline and blood return were noted to be excellent.  The exterior portion of the Silastic catheter was then trimmed and securely attached to the flush reservoir.  Again, injection of heparinized saline and blood return were noted to be good.  The reservoir was then positioned in the pocket and sewn in with interrupted 2-0 silk sutures.  Final evaluation under fluoroscopic guidance showed the reservoir to be well seated.  There were no unusual kinks, turns, or bends within the course of the Silastic catheter. Sponge, instrument, and sharp counts were then verified, and the wound was closed in layers as follows:  The pocket was closed in two layers with 3-0 Vicryl and 4-0 Monocryl. The neck wound was similarly closed in two layers with a 3-0 Vicryl and 4-0 Monocryl.  The Port-A-Cath was then injected with a concentrated heparinized saline solution.  Sterile dressings were then applied.  The anesthetic reversed, and the patient was removed from the operating room to the recovery room in stable condition having tolerated the procedure well. DD:  12/29/99 TD:  12/30/99 Job: [id number]

## 2011-04-22 NOTE — Op Note (Signed)
. [facility]  Patient:    Ashley Hurley                       MRN: [id number] Proc. Date: 12/29/99 Adm. Date:  [id number] Attending:  Sonda Primes CC:         Mardene Celeste. Lurene Shadow, M.D. (2)                           Operative Report  PREOPERATIVE DIAGNOSIS:  Carcinoma, right breast.  POSTOPERATIVE DIAGNOSIS:  Carcinoma, right breast.  OPERATION PERFORMED:  Right modified radical mastectomy.  SURGEON:  Mardene Celeste. Lurene Shadow, M.D.  ASSISTANT:  Marnee Spring. Wiliam Ke, M.D.  ANESTHESIA:  General.  INDICATIONS FOR PROCEDURE:   The patient is a 60 year old female having undergone recent biopsy for a large right-sided breast tumor.  She is returned to the operating room now for right-sided, modified radical mastectomy.  DESCRIPTION OF PROCEDURE:  Following the induction of satisfactory general anesthesia, the patient was positioned supinely.  The right breast and anterior  chest was prepped and draped to be included in the sterile operative field.  An  elliptical incision inclusive of the tumor and the nipple was carried down through the skin and subcutaneous tissues.  I raised a superomedial flap to the clavicle and sternal border and then an inferolateral flap to the rectus muscle at the anterior border of the rectus muscle.  This having been accomplished, the breast tissue was then dissected free from off of the pectoralis major muscle carrying the dissection from medial laterally over the edge of the pectoralis major muscle and over the edge of the pectoralis minor muscle.  There were no Rotters nodes noted. Dissection was carried down along the serratus anterior and then the dissection  carried up into the axilla.  Starting at [facility] ligament, the entire axillary lymphatic fat pad was dissected from medial to lateral maintaining hemostasis along the axillary vein with clips.  The lateral aspect the intercostal humeral nerve was taken.   The long thoracic and thoracodorsal nerves were both positively identified and spared.  The remaining attachments of the breast and axillary content to the lateral chest wall were then taken down, the entire specimen removed and forwarded for pathologic evaluation.  The wound was then inspected for hemostasis and sponge, instrument and sharp counts were verified.  All additional bleeding points were  treated with electrocautery.  Two 10 mm Jackson-Pratt drains were placed in the  wound for drainage.  The skin flaps were then reapproximated using interrupted -0 Vicryl sutures in the subcutaneous layer and skin staples to close the skin.  A  sterile dressing was then applied and attention was then turned to the placement of a Port-A-Cath which will be dictated in a separate note. DD:  12/29/99 TD:  12/29/99 Job: [id number]

## 2011-04-22 NOTE — Op Note (Signed)
[place]  Patient:    TANNA, LOEFFLER Visit Number: [id number] MRN: [id number]          Service Type: SUR Location: [id number] Attending Physician:  Chapman Moss Dictated by:   Teena Irani. Odis Luster, M.D. Proc. Date: 01/04/02 Admit Date:  01/04/2002                             Operative Report  PREOPERATIVE DIAGNOSES: 1. Right breast cancer with right mastectomy and radiation. 2. Left macromastia.  POSTOPERATIVE DIAGNOSES: 1. Right breast cancer with right mastectomy and radiation. 2. Left macromastia.  PROCEDURES: 1. Right breast reconstruction using a contralateral transverse rectus    abdominis myocutaneous flap. 2. Right breast reconstruction completed using a right breast implant. 3. Left breast reduction.  SURGEON:  Teena Irani. Odis Luster, M.D.  ASSISTANT:  Alfredia Ferguson, M.D.  ANESTHESIA:  General.  ESTIMATED BLOOD LOSS:  300 cc.  DRAINS:  Four Blakes; two for the abdomen, one for each side of the chest.  CLINICAL NOTE:  A 60 year old woman who had a right breast cancer.  She has undergone mastectomy as well as chemotherapy and radiation.  She is very interested in reconstruction.  She has a very large left breast and desired a reduction on the left with a right breast reconstruction.  This has been discussed in great detail.  Her panniculus is small, and she understood that she could not really have tissue expansion since she had had previous radiation.  Her choices were a latissimus flap with implant or a TRAM flap with implant and then a left reduction.  She preferred the TRAM flap with the implant and left reduction.  This was discussed with her in great detail, including the risks and the complications and the overall convalescence.  The separate consent forms were discussed in great detail for each of these procedures.  She understood and wished to proceed.  DESCRIPTION OF PROCEDURE:  The patient was taken to the  operating room after having been marked in a full upright position.  She was placed supine on the operating table and after successful administration of general anesthesia, she was prepped with Betadine and draped with sterile drapes.  The old mastectomy scar was incised and the pocket created, recreating the previous mastectomy defect.  Consideration was given to placing this implant submuscular, but on exploring a little bit beneath the pectoralis muscle it was felt to be too tight from the previous radiation to accommodate the implant.  Therefore, it was felt that this would be a placement of the implant below the TRAM flap on top of the pectoralis muscle.  The tunnel was started toward the abdomen as well using electrocautery.  Attention then directed to the abdomen.  The incision was made around the umbilicus and leaving a generous fatty stalk on the right-hand side, which would be the non-pedicle side of the TRAM flap.  The umbilicus was dissected free from surrounding tissue.  The upper limb of the planned elliptical incision was then made, beveling in a cephalad direction in order to capture the perforators on the left-hand side from the rectus muscle.  The dissection carried down to the overlying rectus fascia with great care being taken to avoid damage to the underlying rectus fascia.  Dissection continued cephalad, and the through-and-through tunnel was then connected to the right chest. Meticulous hemostasis with electrocautery.  The patient was placed squarely in the  sitting position in order to check for closure of the abdomen.  She was then returned to supine, and the lower limb of the elliptical incision was then made.  Again dissection carried down through the subcutaneous tissue using electrocautery.  The flap was then elevated from the right pedicle side over just across the midline and on the left side up to the lateral row of perforators.  Parallel incisions were then  made in the anterior rectus fascia, leaving a 2 cm wide strip of fascia intact on the anterior surface of the rectus muscle.  Great care was taken in dissecting the rectus muscle free from the surrounding fascial attachments using a bipolar as well as a scalpel, especially in the area of the tendinous inscriptions.  The dissection then carried distally and the rectus muscle was mobilized very carefully off the posterior sheath.  The rectus muscle was then divided as far distal as possible, again using the electrocautery.  Attention was directed back to the right chest, where a thorough irrigation with saline and a drain was positioned and brought out through a separate stab wound inferiorly and laterally and secured with a 3-0 Prolene suture.  Excellent hemostasis having been confirmed, attention was directed back to the abdomen, where the deep inferior epigastric vessels, which had already been identified, were now mobilized, triple-ligated proximally and double-ligated distally, and divided. The entire flap was now moved to the chest, passing it through the subcutaneous tunnel with ease, and out onto the right chest.  A significant portion of the zone 3 and all of zone 4 were amputated, and there was bright red bleeding consistent with viability.  This was just barely beyond the midline scar that was present.  A tiny bit of zone 2 was also trimmed away, and again there was nice bleeding there consistent with viability.  The flap was temporarily secured in place in the right chest and covered for warmth. Attention was directed back to the abdomen.  After irrigating with saline and meticulous hemostasis having been achieved using electrocautery, the rectus fascia was closed using 0 Prolene interrupted figure-of-eight sutures with great care being taken to avoid damage to the underlying intra-abdominal contents.  After this closure had been completed, she was again irrigated with saline.   Thorough irrigation and meticulous hemostasis again having been achieved, onlay mesh was then placed and secured  around the periphery using a 2-0 Prolene running simple suture.  The umbilicus was brought through the central aspect of this mesh.  Again thorough irrigation with saline, drains were positioned and brought out through a separate stab wound inferiorly, and secured with 3-0 Prolene sutures.  The abdominal closure was with 2-0 and 3-0 Vicryl interrupted inverted deep sutures, and the umbilicus was brought out through as close to the midline as possible as had been marked preoperatively and was secured with 3-0 Vicryl interrupted inverted deep sutures and 5-0 nylon simple interrupted suture. Abdominal skin and umbilicus all appeared viable with good color.  Attention then back to the right chest.  There was thorough irrigation with saline.  The lower inferior edge of the flap was then inset to the skin edge of the inferior mastectomy flap using 3-0 Vicryl interrupted inverted deep sutures and a running 4-0 Vicryl subcuticular suture.  After thoroughly cleaning gloves, the implant was then prepared.  This was a smooth, round saline implant from [employer], lot number [id number].  The prosthesis was covered with saline, the air was removed, and then it was placed  in position in the right chest and was filled using a closed system to a total of 225 cc of saline.  The remainder of the closure after de-epithelializing the superior border of the flap was then completed, again with 3-0 Vicryl interrupted inverted deep sutures and a running 3-0 Vicryl subcuticular stitch. Steri-Strips and dry sterile dressing were applied.  Attention then directed to the left breast for the reduction.  An 8 cm wide pedicle was designed.  The 45 mm nipple-areolar marker was used to mark the new nipple-areolar complex.  The inferior nipple-areolar pedicle was de-epithelialized.  All the incisions were then made  and the inferior nipple-areolar pedicle was then dissected free from the surrounding tissues, taking great care to bevel in an outward direction both medially and laterally in order to leave a broader attachment at the level of the chest wall that was present at the level of the skin.  Having completed this dissection, the nipple-areolar complex appeared very healthy with bright red bleeding around the periphery consistent with viability.  Resections were  performed from medial to central to lateral.  A total of 325 g of tissue was resected. Meticulous hemostasis with electrocautery and thorough irrigation with saline. Again meticulous hemostasis with electrocautery.  She was inspected, and it was felt that she was very close in size with the reconstructed breast.  The drain was positioned and brought through the lateral aspect of the wound.  The closure of 3-0 Vicryl interrupted inverted deep sutures and 2-0 Vicryl interrupted inverted deep sutures.  A measurement of the wound was then taken 5 cm up from the inframammary crease and a 38 mm nipple-areolar marker used to mark the new nipple-areolar site.  This was excised and meticulous hemostasis with electrocautery.  The nipple-areolar complex brought through this opening, and again it was found to be viable with bright red bleeding at the periphery and excellent color and capillary refill.  Nipple and areola were inset using 3-0 Vicryl interrupted inverted deep sutures and 4-0 Vicryl running subcuticular suture.  Steri-Strips and dry sterile dressings were applied. She was placed in a light circumferential Ace wrap in order to hold the implant in a good position.  She was then transported to the recovery room in stable condition, having tolerated the procedure well. Dictated by:   Teena Irani. Odis Luster, M.D. Attending Physician:  Chapman Moss DD:  01/04/02 TD:  01/05/02 Job: [id number]

## 2011-04-22 NOTE — Discharge Summary (Signed)
[place]  Patient:    Ashley Hurley, Ashley Hurley Visit Number: [id number] MRN: [id number]          Service Type: SUR Location: 5700 [id number] [id number] Attending Physician:  Chapman Moss Dictated by:   Teena Irani. Odis Luster, M.D. Admit Date:  01/04/2002 Discharge Date: 01/06/2002                             Discharge Summary  FINAL DIAGNOSES: 1. A right breast cancer with acquired absence of the breast due to    mastectomy. 2. A left mammary hyperplasia.  PROCEDURE PERFORMED:  A right breast reconstruction with a contralateral based transverse rectus abdominis myocutaneous flap, placement of implant for immediate breast reconstruction on the right and a left breast reduction.  SUMMARY OF HISTORY AND PHYSICAL:  A 60 year old woman who underwent a right mastectomy a year ago for breast cancer. She underwent chemotherapy as well as radiation treatments.  She was interested in reconstruction.  After extensive counseling, she elected to use the TRAM flap with a small implant placed underneath it. Due to the radiated chest wall, a tissue expansion was not an option.  She preferred the TRAM flap with the implant as opposed to latissimus flap with the implant.  In addition, she had a very large _________ left breast and desired a breast reduction for symmetry.  All of this was discussed with her in great detail.  Her only significant past medical history other than above was hypertension, well controlled medically.  For further details of history and physical, please see the old records.  HOSPITAL COURSE:  On admission, she was taken to surgery.  The TRAM flap with placement of implant was performed as well as the left breast reduction. She tolerated this very well.  Postoperatively, she did extremely well.  She had excellent pain control, minimal discomfort.  Hemoglobin was down to 9.4 on the first postoperative day, down from a preoperative of 13.4.  Hematocrit  26.6. Electrolytes stable.  She was tolerating a regular diet by the evening of the first postoperative day and was ambulating.  By the second postoperative day, flap continued to have excellent color with good color in the nipple complex on the left side and it was felt that she was ready to be discharged.  DISPOSITION: 1. She is discharged on a regular diet. 2. Discharge medications:    a. Keflex 250 mg p.o. q.i.d.    b. Mepergan Fortis one every three to four hours p.r.n. pain.    c. Colace 100 mg p.o. b.i.d. 3. No lifting, no exercise. 4. No shower. 5. Wear her bra for support which we put her in today and it is not tight    in the front where the muscle pedicle is located. 6. Empty drain three times a day and record the amounts. 7. Use spirometer at home six times a day. 8. She will call us Thursday morning of this week to discuss the drainage    with the nurse who will plan her follow-up based on drain removal. Dictated by:   Teena Irani. Odis Luster, M.D. Attending Physician:  Chapman Moss DD:  01/06/02 TD:  01/07/02 Job: [id number]

## 2011-04-26 ENCOUNTER — Telehealth: Payer: Self-pay | Admitting: *Deleted

## 2011-04-26 MED ORDER — ESCITALOPRAM OXALATE 20 MG PO TABS: 20.0000 mg | ORAL_TABLET | Freq: Every day | ORAL | Status: DC

## 2011-04-26 NOTE — Telephone Encounter (Signed)
Pt called saying that [facility] called yesterday wanting a refill on lexapro. I explained to pt that we never got a refill request for this. Pt thinks that they called Dr. Wallace Cullens for a refill but they told her that they called [place]. I told pt that I would go ahead and send it to [facility] for her and that I'm sorry about the mixup but to call me if she does have a problem with this.

## 2011-10-22 ENCOUNTER — Other Ambulatory Visit: Payer: Self-pay | Admitting: Internal Medicine

## 2011-10-24 ENCOUNTER — Telehealth: Payer: Self-pay | Admitting: Internal Medicine

## 2011-10-24 NOTE — Telephone Encounter (Signed)
Refill generic lexapro. Needs today. Going out of town tomorrow. She will run out. [facility]. Thanks.

## 2011-10-24 NOTE — Telephone Encounter (Signed)
rx sent in on 10/22/11

## 2011-10-28 ENCOUNTER — Encounter: Payer: Self-pay | Admitting: Internal Medicine

## 2012-03-04 ENCOUNTER — Other Ambulatory Visit: Payer: Self-pay | Admitting: Internal Medicine

## 2012-04-21 ENCOUNTER — Other Ambulatory Visit: Payer: Self-pay | Admitting: Internal Medicine

## 2012-04-26 NOTE — Telephone Encounter (Signed)
Pt last seen 02/02/11.  Rx last filled 10/22/11 #30x 5 rf. Pls advise.

## 2012-04-26 NOTE — Telephone Encounter (Signed)
She was due for a yearly visit in February and is not on the schedule for  An OV/  COntact her and arrange of yearly OV om in the next 30 days. Then   Refill med for 30 days.

## 2012-04-27 NOTE — Telephone Encounter (Signed)
Spoke with pt and pt is aware and states she will schedule an appt. Rx sent to pharmacy.

## 2012-05-08 ENCOUNTER — Telehealth: Payer: Self-pay | Admitting: Family Medicine

## 2012-05-08 ENCOUNTER — Other Ambulatory Visit: Payer: Self-pay | Admitting: Internal Medicine

## 2012-05-08 ENCOUNTER — Other Ambulatory Visit: Payer: BC Managed Care – PPO

## 2012-05-08 DIAGNOSIS — E785 Hyperlipidemia, unspecified: Secondary | ICD-10-CM

## 2012-05-08 DIAGNOSIS — R7309 Other abnormal glucose: Secondary | ICD-10-CM

## 2012-05-08 DIAGNOSIS — I10 Essential (primary) hypertension: Secondary | ICD-10-CM

## 2012-05-08 DIAGNOSIS — Z0000 Encounter for general adult medical examination without abnormal findings: Secondary | ICD-10-CM

## 2012-05-08 NOTE — Telephone Encounter (Signed)
Labs ordered.

## 2012-05-08 NOTE — Telephone Encounter (Signed)
This pt is going to [facility] lab at the end of this wk or beginning of next. Can you please put in orders for [facility] labs? Thanks.

## 2012-05-10 ENCOUNTER — Other Ambulatory Visit (INDEPENDENT_AMBULATORY_CARE_PROVIDER_SITE_OTHER): Payer: BC Managed Care – PPO

## 2012-05-10 DIAGNOSIS — Z0000 Encounter for general adult medical examination without abnormal findings: Secondary | ICD-10-CM

## 2012-05-10 LAB — CBC WITH DIFFERENTIAL/PLATELET
Basophils Relative: 0.5 % (ref 0.0–3.0)
Eosinophils Absolute: 0.1 10*3/uL (ref 0.0–0.7)
Eosinophils Relative: 1.5 % (ref 0.0–5.0)
Lymphocytes Relative: 40.5 % (ref 12.0–46.0)
MCV: 98.8 fl (ref 78.0–100.0)
Monocytes Absolute: 0.5 10*3/uL (ref 0.1–1.0)
Neutrophils Relative %: 49.2 % (ref 43.0–77.0)
Platelets: 220 10*3/uL (ref 150.0–400.0)
RBC: 3.8 Mil/uL — ABNORMAL LOW (ref 3.87–5.11)
WBC: 5.6 10*3/uL (ref 4.5–10.5)

## 2012-05-10 LAB — LIPID PANEL
HDL: 61.4 mg/dL (ref >39.00)
LDL Cholesterol: 109 mg/dL — ABNORMAL HIGH (ref 0–99)
Total CHOL/HDL Ratio: 3
VLDL: 15.4 mg/dL (ref 0.0–40.0)

## 2012-05-10 LAB — HEPATIC FUNCTION PANEL
AST: 35 U/L (ref 0–37)
Albumin: 3.6 g/dL (ref 3.5–5.2)
Alkaline Phosphatase: 50 U/L (ref 39–117)
Bilirubin, Direct: 0.1 mg/dL (ref 0.0–0.3)
Total Bilirubin: 0.6 mg/dL (ref 0.3–1.2)

## 2012-05-10 LAB — BASIC METABOLIC PANEL
Chloride: 105 mEq/L (ref 96–112)
GFR: 80.73 mL/min (ref >60.00)
Potassium: 4 mEq/L (ref 3.5–5.1)
Sodium: 139 mEq/L (ref 135–145)

## 2012-05-10 LAB — LDL CHOLESTEROL, DIRECT: Direct LDL: 112.7 mg/dL

## 2012-05-10 LAB — TSH: TSH: 1.63 u[IU]/mL (ref 0.35–5.50)

## 2012-05-23 ENCOUNTER — Ambulatory Visit (INDEPENDENT_AMBULATORY_CARE_PROVIDER_SITE_OTHER): Payer: BC Managed Care – PPO | Admitting: Internal Medicine

## 2012-05-23 ENCOUNTER — Encounter: Payer: Self-pay | Admitting: Internal Medicine

## 2012-05-23 ENCOUNTER — Other Ambulatory Visit (INDEPENDENT_AMBULATORY_CARE_PROVIDER_SITE_OTHER): Payer: BC Managed Care – PPO | Admitting: Family Medicine

## 2012-05-23 VITALS — BP 126/80 | HR 118 | Temp 98.2°F | Ht 65.0 in | Wt 145.0 lb

## 2012-05-23 DIAGNOSIS — Z853 Personal history of malignant neoplasm of breast: Secondary | ICD-10-CM

## 2012-05-23 DIAGNOSIS — Z0000 Encounter for general adult medical examination without abnormal findings: Secondary | ICD-10-CM | POA: Insufficient documentation

## 2012-05-23 DIAGNOSIS — I10 Essential (primary) hypertension: Secondary | ICD-10-CM

## 2012-05-23 DIAGNOSIS — E785 Hyperlipidemia, unspecified: Secondary | ICD-10-CM

## 2012-05-23 DIAGNOSIS — Z2911 Encounter for prophylactic immunotherapy for respiratory syncytial virus (RSV): Secondary | ICD-10-CM

## 2012-05-23 DIAGNOSIS — R7309 Other abnormal glucose: Secondary | ICD-10-CM

## 2012-05-23 DIAGNOSIS — Z1211 Encounter for screening for malignant neoplasm of colon: Secondary | ICD-10-CM | POA: Insufficient documentation

## 2012-05-23 MED ORDER — ESCITALOPRAM OXALATE 20 MG PO TABS: 20.0000 mg | ORAL_TABLET | Freq: Every day | ORAL | Status: DC

## 2012-05-23 NOTE — Progress Notes (Signed)
Subjective:    Patient ID: Ashley Hurley, female    DOB: 06/06/1951, 61 y.o.   MRN: [id number]  HPI Patient comes in today for preventive visit and follow-up of medical issues. Update  history since  last visit: Since last visit doing well. To have  Oral surgery tomorrow. Wears glasses .  LIPIDS  Exercising taking niaspan. Bp good . Mood  Ok wants to stay on lexapro  Review of Systems ROS:  GEN/ HEENT: No fever, significant weight changes sweats headaches vision problems hearing changes, CV/ PULM; No chest pain shortness of breath cough, syncope,edema  change in exercise tolerance. GI /GU: No adominal pain, vomiting, change in bowel habits. No blood in the stool. No significant GU symptoms. SKIN/HEME: ,no acute skin rashes suspicious lesions or bleeding. No lymphadenopathy, nodules, masses.  NEURO/ PSYCH:  No neurologic signs such as weakness numbness. No depression anxiety. IMM/ Allergy: No unusual infections.  Allergy .   REST of 12 system review negative except as per HPI  Past history family history social history reviewed in the electronic medical record. Outpatient Encounter Prescriptions as of 05/23/2012  Medication Sig Dispense Refill  . Calcium Carbonate (CALCIUM 600) 1500 MG TABS Take 1,500 mg by mouth daily.        . Coenzyme Q10 10 MG capsule Take 10 mg by mouth daily.        Marland Kitchen escitalopram (LEXAPRO) 20 MG tablet Take 1 tablet (20 mg total) by mouth daily.  30 tablet  11  . Garlic 400 MG TABS Take 400 mg by mouth daily.        Marland Kitchen lisinopril-hydrochlorothiazide (PRINZIDE,ZESTORETIC) 10-12.5 MG per tablet TAKE 1 TABLET BY MOUTH EVERY DAY  90 tablet  3  . Multiple Vitamin (MULTIVITAMIN) tablet Take 1 tablet by mouth daily.        Marland Kitchen NIASPAN 500 MG CR tablet TAKE 3 TABLETS BY MOUTH AT BEDTIME OR AS DIRECTED  90 tablet  12  . VITAMIN D, CHOLECALCIFEROL, PO Take by mouth.        . DISCONTD: escitalopram (LEXAPRO) 20 MG tablet TAKE 1 TABLET EVERY DAY  30 tablet  0         Objective:   Physical Exam BP 126/80  Pulse 118  Temp 98.2 F (36.8 C) (Oral)  Ht 5\' 5"  (1.651 m)  Wt 145 lb (65.772 kg)  BMI 24.13 kg/m2  SpO2 97% Physical Exam: Vital signs reviewed WUJ:WJXB is a well-developed well-nourished alert cooperative  white female who appears her stated age in no acute distress.  HEENT: normocephalic atraumatic , Eyes: PERRL EOM's full, conjunctiva clear,glasses  Nares: paten,t no deformity discharge or tenderness., Ears: no deformity EAC's clear TMs with normal landmarks. Mouth: clear OP, no lesions, edema.  Moist mucous membranes. Dentition in adequate repair. NECK: supple without masses, thyromegaly or bruits. CHEST/PULM:  Clear to auscultation and percussion breath sounds equal no wheeze , rales or rhonchi. No chest wall deformities or tenderness. CV: PMI is nondisplaced, S1 S2 no gallops, murmurs, rubs. Peripheral pulses are full without delay.No JVD .  ABDOMEN: Bowel sounds normal nontender  No guard or rebound, no hepato splenomegal no CVA tenderness.  No hernia. Breast: reconstructed right no masses.  Left no masses  Or adenopathy Extremtities:  No clubbing cyanosis or edema, no acute joint swelling or redness no focal atrophy NEURO:  Oriented x3, cranial nerves 3-12 appear to be intact, no obvious focal weakness,gait within normal limits no abnormal reflexes or asymmetrical SKIN: No acute rashes  normal turgor, color, no bruising or petechiae. PSYCH: Oriented, good eye contact, no obvious depression anxiety, cognition and judgment appear normal. LN: no cervical axillary inguinal adenopathy Lab Results  Component Value Date   WBC 5.6 05/10/2012   HGB 12.5 05/10/2012   HCT 37.5 05/10/2012   PLT 220.0 05/10/2012   GLUCOSE 108* 05/10/2012   CHOL 186 05/10/2012   TRIG 77.0 05/10/2012   HDL 61.40 05/10/2012   LDLDIRECT 112.7 05/10/2012   LDLCALC 109* 05/10/2012   ALT 30 05/10/2012   AST 35 05/10/2012   NA 139 05/10/2012   K 4.0 05/10/2012   CL 105 05/10/2012   CREATININE  0.8 05/10/2012   BUN 12 05/10/2012   CO2 28 05/10/2012   TSH 1.63 05/10/2012   HGBA1C 5.8 04/17/2008      Assessment & Plan:   Preventive Health Care Counseled regarding healthy nutrition, exercise, sleep, injury prevention, calcium vit d and healthy weight .zostavax  Today last dexa reported as normal 4-5 years ago. No hx of fracture. Last pap 2011   Still hasn't  Had colonoscopy screen   Disc importance to do re refer. LIPIDS  Better continue weight training. Mood stable continue same med lexapro. HT stable   Hx of breast cancer  Fasting bg  elevated mildly  Counseled.

## 2012-05-23 NOTE — Patient Instructions (Signed)
Will refer for colonoscopy. Colon cancer screening  is very important at your age.  Continue lifestyle intervention healthy eating and exercise . Watch the simple carbs and sugars to limit for sugar control.  zostavax today. No change in med at this time.

## 2012-05-24 ENCOUNTER — Other Ambulatory Visit: Payer: Self-pay | Admitting: Endodontics

## 2012-07-17 ENCOUNTER — Ambulatory Visit (AMBULATORY_SURGERY_CENTER): Payer: BC Managed Care – PPO

## 2012-07-17 ENCOUNTER — Encounter: Payer: Self-pay | Admitting: Gastroenterology

## 2012-07-17 VITALS — Ht 66.0 in | Wt 145.1 lb

## 2012-07-17 DIAGNOSIS — Z1211 Encounter for screening for malignant neoplasm of colon: Secondary | ICD-10-CM

## 2012-07-17 MED ORDER — MOVIPREP 100 G PO SOLR: 1.0000 | Freq: Once | ORAL | Status: DC

## 2012-07-31 ENCOUNTER — Ambulatory Visit (AMBULATORY_SURGERY_CENTER): Payer: BC Managed Care – PPO | Admitting: Gastroenterology

## 2012-07-31 ENCOUNTER — Encounter: Payer: Self-pay | Admitting: Gastroenterology

## 2012-07-31 VITALS — BP 129/54 | HR 99 | Temp 98.8°F | Resp 14 | Ht 66.0 in | Wt 145.0 lb

## 2012-07-31 DIAGNOSIS — Z1211 Encounter for screening for malignant neoplasm of colon: Secondary | ICD-10-CM

## 2012-07-31 MED ORDER — SODIUM CHLORIDE 0.9 % IV SOLN: 500.0000 mL | INTRAVENOUS | Status: DC

## 2012-07-31 NOTE — Patient Instructions (Addendum)
YOU HAD AN ENDOSCOPIC PROCEDURE TODAY AT THE [facility]: Refer to the procedure report that was given to you for any specific questions about what was found during the examination.  If the procedure report does not answer your questions, please call your gastroenterologist to clarify.  If you requested that your care partner not be given the details of your procedure findings, then the procedure report has been included in a sealed envelope for you to review at your convenience later.  YOU SHOULD EXPECT: Some feelings of bloating in the abdomen. Passage of more gas than usual.  Walking can help get rid of the air that was put into your GI tract during the procedure and reduce the bloating. If you had a lower endoscopy (such as a colonoscopy or flexible sigmoidoscopy) you may notice spotting of blood in your stool or on the toilet paper. If you underwent a bowel prep for your procedure, then you may not have a normal bowel movement for a few days.  DIET: Your first meal following the procedure should be a light meal and then it is ok to progress to your normal diet.  A half-sandwich or bowl of soup is an example of a good first meal.  Heavy or fried foods are harder to digest and may make you feel nauseous or bloated.  Likewise meals heavy in dairy and vegetables can cause extra gas to form and this can also increase the bloating.  Drink plenty of fluids but you should avoid alcoholic beverages for 24 hours.  ACTIVITY: Your care partner should take you home directly after the procedure.  You should plan to take it easy, moving slowly for the rest of the day.  You can resume normal activity the day after the procedure however you should NOT DRIVE or use heavy machinery for 24 hours (because of the sedation medicines used during the test).    SYMPTOMS TO REPORT IMMEDIATELY: A gastroenterologist can be reached at any hour.  During normal business hours, 8:30 AM to 5:00 PM Monday through Friday,  call [phone].  After hours and on weekends, please call the GI answering service at [phone] who will take a message and have the physician on call contact you.   Following lower endoscopy (colonoscopy or flexible sigmoidoscopy):  Excessive amounts of blood in the stool  Significant tenderness or worsening of abdominal pains  Swelling of the abdomen that is new, acute  Fever of 100F or higher  Following upper endoscopy (EGD)  Vomiting of blood or coffee ground material  New chest pain or pain under the shoulder blades  Painful or persistently difficult swallowing  New shortness of breath  Fever of 100F or higher  Black, tarry-looking stools  FOLLOW UP: If any biopsies were taken you will be contacted by phone or by letter within the next 1-3 weeks.  Call your gastroenterologist if you have not heard about the biopsies in 3 weeks.  Our staff will call the home number listed on your records the next business day following your procedure to check on you and address any questions or concerns that you may have at that time regarding the information given to you following your procedure. This is a courtesy call and so if there is no answer at the home number and we have not heard from you through the emergency physician on call, we will assume that you have returned to your regular daily activities without incident.  SIGNATURES/CONFIDENTIALITY: You and/or your care   partner have signed paperwork which will be entered into your electronic medical record.  These signatures attest to the fact that that the information above on your After Visit Summary has been reviewed and is understood.  Full responsibility of the confidentiality of this discharge information lies with you and/or your care-partner.  

## 2012-07-31 NOTE — Op Note (Addendum)
[facility] [address] [place], [address]   COLONOSCOPY PROCEDURE REPORT  PATIENT: [name], [name]  MR#: [id number] BIRTHDATE: 1951/11/04 , 61  yrs. old GENDER: Female ENDOSCOPIST: Meryl Dare, MD, [facility] REFERRED XB:[id number] Lonie Peak, M.D. PROCEDURE DATE:  07/31/2012 PROCEDURE:   Colonoscopy, screening ASA CLASS:   Class II INDICATIONS: average risk patient for colorectal cancer. MEDICATIONS: MAC sedation, administered by CRNA and propofol (Diprivan) 260mg  IV  DESCRIPTION OF PROCEDURE:   After the risks benefits and alternatives of the procedure were thoroughly explained, informed consent was obtained.  A digital rectal exam revealed no abnormalities of the rectum.   The LB [id number]  endoscope was introduced through the anus and advanced to the cecum, which was identified by both the appendix and ileocecal valve. No adverse events experienced.  The colon was tortuous. The quality of the prep was excellent, using MoviPrep  The instrument was then slowly withdrawn as the colon was fully examined.   COLON FINDINGS: A normal appearing cecum, ileocecal valve, and appendiceal orifice were identified.  The ascending, hepatic flexure, transverse, splenic flexure, descending, sigmoid colon and rectum appeared unremarkable.  No polyps or cancers were seen. Retroflexed views revealed internal hemorrhoids. The time to cecum=3 minutes 40 seconds  Withdrawal time=9 minutes 150 seconds. The scope was withdrawn and the procedure completed.  COMPLICATIONS: There were no complications.  ENDOSCOPIC IMPRESSION: 1. Normal colon 2.  Small internal hemorrhiods  RECOMMENDATIONS: Continue current colorectal screening recommendations for "routine risk" patients with a repeat colonoscopy in 10 years.  eSigned:  Meryl Dare, MD, [facility] 07/31/2012 9:29 AM Revised: 07/31/2012 9:29 AM

## 2012-07-31 NOTE — Progress Notes (Signed)
Patient did not experience any of the following events: a burn prior to discharge; a fall within the facility; wrong site/side/patient/procedure/implant event; or a hospital transfer or hospital admission upon discharge from the facility. (G8907) Patient did not have preoperative order for IV antibiotic SSI prophylaxis. (G8918)  

## 2012-08-01 ENCOUNTER — Telehealth: Payer: Self-pay | Admitting: *Deleted

## 2012-08-01 NOTE — Telephone Encounter (Signed)
  Follow up Call-  Call back number 07/31/2012  Post procedure Call Back phone  # [phone]     Patient questions:  Do you have a fever, pain , or abdominal swelling? no Pain Score  0 *  Have you tolerated food without any problems? yes  Have you been able to return to your normal activities? yes  Do you have any questions about your discharge instructions: Diet   no Medications  no Follow up visit  no  Do you have questions or concerns about your Care? no  Actions: * If pain score is 4 or above: No action needed, pain <4.

## 2012-12-25 LAB — HM MAMMOGRAPHY: HM Mammogram: NEGATIVE

## 2013-01-03 ENCOUNTER — Encounter: Payer: Self-pay | Admitting: Internal Medicine

## 2013-03-03 ENCOUNTER — Other Ambulatory Visit: Payer: Self-pay | Admitting: Internal Medicine

## 2013-03-30 ENCOUNTER — Other Ambulatory Visit: Payer: Self-pay | Admitting: Internal Medicine

## 2013-05-15 ENCOUNTER — Other Ambulatory Visit: Payer: Self-pay | Admitting: Family Medicine

## 2013-05-15 MED ORDER — ESCITALOPRAM OXALATE 20 MG PO TABS: 20.0000 mg | ORAL_TABLET | Freq: Every day | ORAL | Status: DC

## 2013-05-28 ENCOUNTER — Other Ambulatory Visit: Payer: Self-pay | Admitting: Internal Medicine

## 2013-06-02 ENCOUNTER — Other Ambulatory Visit: Payer: Self-pay | Admitting: Internal Medicine

## 2013-06-16 ENCOUNTER — Other Ambulatory Visit: Payer: Self-pay | Admitting: Internal Medicine

## 2013-07-01 ENCOUNTER — Other Ambulatory Visit: Payer: Self-pay | Admitting: Internal Medicine

## 2013-08-07 ENCOUNTER — Other Ambulatory Visit (INDEPENDENT_AMBULATORY_CARE_PROVIDER_SITE_OTHER): Payer: BC Managed Care – PPO

## 2013-08-07 DIAGNOSIS — Z0000 Encounter for general adult medical examination without abnormal findings: Secondary | ICD-10-CM

## 2013-08-07 LAB — CBC WITH DIFFERENTIAL/PLATELET
Basophils Relative: 0.5 % (ref 0.0–3.0)
Eosinophils Relative: 1.6 % (ref 0.0–5.0)
Hemoglobin: 13.5 g/dL (ref 12.0–15.0)
Lymphocytes Relative: 36.9 % (ref 12.0–46.0)
Monocytes Relative: 5.1 % (ref 3.0–12.0)
Neutro Abs: 3.5 10*3/uL (ref 1.4–7.7)
RBC: 4.05 Mil/uL (ref 3.87–5.11)

## 2013-08-07 LAB — HEPATIC FUNCTION PANEL
Alkaline Phosphatase: 71 U/L (ref 39–117)
Bilirubin, Direct: 0.1 mg/dL (ref 0.0–0.3)
Total Bilirubin: 0.8 mg/dL (ref 0.3–1.2)
Total Protein: 6.6 g/dL (ref 6.0–8.3)

## 2013-08-07 LAB — BASIC METABOLIC PANEL
BUN: 12 mg/dL (ref 6–23)
CO2: 25 mEq/L (ref 19–32)
Chloride: 104 mEq/L (ref 96–112)
Creatinine, Ser: 0.7 mg/dL (ref 0.4–1.2)

## 2013-08-07 LAB — LIPID PANEL
Cholesterol: 155 mg/dL (ref 0–200)
LDL Cholesterol: 76 mg/dL (ref 0–99)
Total CHOL/HDL Ratio: 2

## 2013-08-14 ENCOUNTER — Ambulatory Visit (INDEPENDENT_AMBULATORY_CARE_PROVIDER_SITE_OTHER): Payer: BC Managed Care – PPO | Admitting: Internal Medicine

## 2013-08-14 ENCOUNTER — Encounter: Payer: Self-pay | Admitting: Internal Medicine

## 2013-08-14 VITALS — BP 118/76 | HR 104 | Temp 98.0°F | Ht 65.0 in | Wt 147.0 lb

## 2013-08-14 DIAGNOSIS — I10 Essential (primary) hypertension: Secondary | ICD-10-CM

## 2013-08-14 DIAGNOSIS — R7309 Other abnormal glucose: Secondary | ICD-10-CM

## 2013-08-14 DIAGNOSIS — R7989 Other specified abnormal findings of blood chemistry: Secondary | ICD-10-CM

## 2013-08-14 DIAGNOSIS — R945 Abnormal results of liver function studies: Secondary | ICD-10-CM | POA: Insufficient documentation

## 2013-08-14 DIAGNOSIS — E785 Hyperlipidemia, unspecified: Secondary | ICD-10-CM

## 2013-08-14 DIAGNOSIS — Z853 Personal history of malignant neoplasm of breast: Secondary | ICD-10-CM

## 2013-08-14 DIAGNOSIS — Z23 Encounter for immunization: Secondary | ICD-10-CM

## 2013-08-14 DIAGNOSIS — Z0000 Encounter for general adult medical examination without abnormal findings: Secondary | ICD-10-CM

## 2013-08-14 NOTE — Progress Notes (Signed)
Chief Complaint  Patient presents with  . Annual Exam    HPI: Patient comes in today for Preventive Health Care visit   lexapro very helpful not depressed Niacin has been on medicine for a while no side effects bp med: Continuing blood pressure control extremem hiking in [place]   end of the [place] in August before laboratory tests were done was very tired and dehydrated at that time. No cardiovascular pulmonary symptoms or acute injury no excess alcohol regular Advil Aleve or Tylenol. ROS:  GEN/ HEENT: No fever, significant weight changes sweats headaches vision problems hearing changes, CV/ PULM; No chest pain shortness of breath cough, syncope,edema  change in exercise tolerance. GI /GU: No adominal pain, vomiting, change in bowel habits. No blood in the stool. No significant GU symptoms. SKIN/HEME: ,no acute skin rashes suspicious lesions or bleeding. No lymphadenopathy, nodules, masses.  NEURO/ PSYCH:  No neurologic signs such as weakness numbness. No depression anxiety. IMM/ Allergy: No unusual infections.  Allergy .   REST of 12 system review negative except as per HPI   Past Medical History  Diagnosis Date  . Cancer     breast  . Hypertension   . Hyperlipidemia   . Allergy   . Chocolate cyst of ovary   . Breast cancer, right breast Jan 2001    [id number] radiation and chemo granfortuna    Family History  Problem Relation Age of Onset  . Coronary artery disease Mother 64  . Diabetes Mother   . Cancer Father 17    pancreatic  . Cancer Sister     breast  . Hypertension      family hx  . Hypertension Sister   . Stroke Sister   . Colon cancer Neg Hx   . Rectal cancer Neg Hx   . Stomach cancer Neg Hx     History   Social History  . Marital Status: Single    Spouse Name: N/A    Number of Children: N/A  . 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 Smoking status: Never Smoker   .  Smokeless tobacco: None  . Alcohol Use: 0.0 oz/week    3-4 Glasses of wine per week  . Drug Use: No  . Sexual Activity: None   Other Topics Concern  . 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    Non smoker    Exercises some   G2P2   Tobacco   Etoh: ocass   caffiene coffee in am     Outpatient Encounter Prescriptions as of 08/14/2013  Medication Sig Dispense Refill  . Coenzyme Q10 10 MG capsule Take 10 mg by mouth daily.        Marland Kitchen escitalopram (LEXAPRO) 20 MG tablet TAKE 1 TABLET EVERY DAY  30 tablet  1  . Garlic 400 MG TABS Take 400 mg by mouth daily.        Marland Kitchen lisinopril-hydrochlorothiazide (PRINZIDE,ZESTORETIC) 10-12.5 MG per tablet TAKE 1 TABLET BY MOUTH EVERY DAY  90 tablet  0  . Multiple Vitamin (MULTIVITAMIN) tablet Take 1 tablet by mouth daily.        . niacin (NIASPAN) 750 MG CR tablet TAKE 3 TABLETS BY MOUTH AT BEDTIME OR AS DIRECTED  90 tablet  1  . VITAMIN  D, CHOLECALCIFEROL, PO Take by mouth.        . [DISCONTINUED] Calcium Carbonate (CALCIUM 600) 1500 MG TABS Take 1,500 mg by mouth daily.         No facility-administered encounter medications on file as of 08/14/2013.    EXAM:  BP 118/76  Pulse 104  Temp(Src) 98 F (36.7 C) (Oral)  Ht 5\' 5"  (1.651 m)  Wt 147 lb (66.679 kg)  BMI 24.46 kg/m2  SpO2 98%  Body mass index is 24.46 kg/(m^2).  Physical Exam: Vital signs reviewed ZOX:WRUE is a well-developed well-nourished alert cooperative   female who appears her stated age in no acute distress.  HEENT: normocephalic atraumatic , Eyes: PERRL EOM's full, conjunctiva clear, Nares: paten,t no deformity discharge or tenderness., Ears: no deformity EAC's clear TMs with normal landmarks. Mouth: clear OP, no lesions, edema.  Moist mucous membranes. Dentition in adequate repair. NECK: supple without masses, thyromegaly or bruits. CHEST/PULM:  Clear to  auscultation and percussion breath sounds equal no wheeze , rales or rhonchi. No chest wall deformities or tenderness. Breast:  Left normal by inspection no discharge dimpling masses axilla is clear right reconstructed no obvious mass axilla with no nodules. No edema of right arm CV: PMI is nondisplaced, S1 S2 no gallops, murmurs, rubs. Peripheral pulses are full without delay.No JVD .  ABDOMEN: Bowel sounds normal nontender  No guard or rebound, no hepato splenomegal no CVA tenderness.  No hernia. Extremtities:  No clubbing cyanosis or edema, no acute joint swelling or redness no focal atrophy NEURO:  Oriented x3, cranial nerves 3-12 appear to be intact, no obvious focal weakness,gait within normal limits no abnormal reflexes or asymmetrical SKIN: No acute rashes normal turgor, color, no bruising or petechiae. PSYCH: Oriented, good eye contact, no obvious depression anxiety, cognition and judgment appear normal. LN: no cervical axillary inguinal adenopathy  Lab Results  Component Value Date   WBC 6.3 08/07/2013   HGB 13.5 08/07/2013   HCT 40.0 08/07/2013   PLT 190.0 08/07/2013   GLUCOSE 115* 08/07/2013   CHOL 155 08/07/2013   TRIG 82.0 08/07/2013   HDL 62.70 08/07/2013   LDLDIRECT 112.7 05/10/2012   LDLCALC 76 08/07/2013   ALT 89* 08/07/2013   AST 83* 08/07/2013   NA 137 08/07/2013   K 3.9 08/07/2013   CL 104 08/07/2013   CREATININE 0.7 08/07/2013   BUN 12 08/07/2013   CO2 25 08/07/2013   TSH 1.52 08/07/2013   HGBA1C 5.8 04/17/2008    ASSESSMENT AND PLAN:  Discussed the following assessment and plan:  Visit for preventive health examination - pt wants to wait till next year for pap and acceptabl risk. hx nl dexa repeat at age 42   HYPERTENSION - controlled  BREAST CANCER, HX OF  Abnormal LFTs - New possibly related to severe exercise and hydration issue in August recheck in 3-4 weeks with serology ultrasound if not resolved.  Need for prophylactic vaccination and inoculation against influenza - Plan: Flu  Vaccine QUAD 36+ mos PF IM (Fluarix)  HYPERLIPIDEMIA - on niacin for years.   ABNORMAL GLUCOSE NEC  Patient Care Team: Madelin Headings, MD as PCP - General Levert Feinstein, MD (Hematology and Oncology) Patient Instructions  Your exam is normal continue healthy lifestyle. Get pap next year . Repeat  lfts with  Serology that we discussed and hg a1c  In about a month Avoid advil, aleve, tylenol. Alcohol  .     If the abnormality continues we will  do more blood work and an ultrasound test of the liver to make sure it looks okay. Followup as appropriate for results.      Neta Mends. Panosh M.D.  Health Maintenance  Topic Date Due  . Pap Smear  08/14/2014  . Influenza Vaccine  07/05/2014  . Mammogram  12/25/2014  . Tetanus/tdap  12/30/2019  . Colonoscopy  07/31/2022  . Zostavax  Completed   Health Maintenance Review

## 2013-08-14 NOTE — Patient Instructions (Addendum)
Your exam is normal continue healthy lifestyle. Get pap next year . Repeat  lfts with  Serology that we discussed and hg a1c  In about a month Avoid advil, aleve, tylenol. Alcohol  .     If the abnormality continues we will do more blood work and an ultrasound test of the liver to make sure it looks okay. Followup as appropriate for results.

## 2013-08-16 ENCOUNTER — Other Ambulatory Visit: Payer: Self-pay | Admitting: Internal Medicine

## 2013-08-28 ENCOUNTER — Other Ambulatory Visit: Payer: Self-pay | Admitting: Internal Medicine

## 2013-09-17 ENCOUNTER — Other Ambulatory Visit: Payer: BC Managed Care – PPO

## 2014-01-01 ENCOUNTER — Other Ambulatory Visit: Payer: Self-pay | Admitting: Internal Medicine

## 2014-02-18 ENCOUNTER — Other Ambulatory Visit: Payer: Self-pay | Admitting: Internal Medicine

## 2014-04-04 ENCOUNTER — Other Ambulatory Visit: Payer: Self-pay | Admitting: Internal Medicine

## 2014-07-29 ENCOUNTER — Other Ambulatory Visit: Payer: Self-pay | Admitting: Internal Medicine

## 2014-07-31 NOTE — Telephone Encounter (Signed)
Pt is over due for laboratory monitoring   Reveiwed record and noted that her fu labs that were to be done in October 14 were canceled and never done .  Please arrange for this now asap  lipids liver bmp hga1c hep b ag and aby hep c   ,  Dx abnormal lfts and  Elevated bg and hyperlipidemia     Would like her to decrease the niaspan   to no more than 1500 mg per day( 2 po qd)  in the interim  Can disp 60 until she gets labs and ov.   Lab Results  Component Value Date   WBC 6.3 08/07/2013   HGB 13.5 08/07/2013   HCT 40.0 08/07/2013   PLT 190.0 08/07/2013   GLUCOSE 115* 08/07/2013   CHOL 155 08/07/2013   TRIG 82.0 08/07/2013   HDL 62.70 08/07/2013   LDLDIRECT 112.7 05/10/2012   LDLCALC 76 08/07/2013   ALT 89* 08/07/2013   AST 83* 08/07/2013   NA 137 08/07/2013   K 3.9 08/07/2013   CL 104 08/07/2013   CREATININE 0.7 08/07/2013   BUN 12 08/07/2013   CO2 25 08/07/2013   TSH 1.52 08/07/2013   HGBA1C 5.8 04/17/2008

## 2014-08-01 ENCOUNTER — Other Ambulatory Visit: Payer: Self-pay | Admitting: Family Medicine

## 2014-08-01 ENCOUNTER — Telehealth: Payer: Self-pay | Admitting: Internal Medicine

## 2014-08-01 DIAGNOSIS — E785 Hyperlipidemia, unspecified: Secondary | ICD-10-CM

## 2014-08-01 DIAGNOSIS — R945 Abnormal results of liver function studies: Secondary | ICD-10-CM

## 2014-08-01 DIAGNOSIS — R7989 Other specified abnormal findings of blood chemistry: Secondary | ICD-10-CM

## 2014-08-01 DIAGNOSIS — R739 Hyperglycemia, unspecified: Secondary | ICD-10-CM

## 2014-08-01 NOTE — Telephone Encounter (Signed)
This medication has been sent to the pharmacy.  Pt needs lab work and a follow up appt.  I have scheduled the lab work for 09/03/14.  Please make a follow up with the pt and mail her a reminder for both appointments.  Thanks!

## 2014-08-01 NOTE — Telephone Encounter (Signed)
Pt is following up on her refill for niacin (NIASPAN) 750 MG CR tablet, pt states pharmacy has not heard anything, pt states she is leaving to go out of the country on Monday and needs the medication prior to leaving. [facility] [address]

## 2014-08-04 NOTE — Telephone Encounter (Signed)
Noted.  Please make sure and follow through.

## 2014-08-04 NOTE — Telephone Encounter (Signed)
Left message for pt to call ane schedule follow-up appt

## 2014-08-05 NOTE — Telephone Encounter (Signed)
appt scheduled

## 2014-08-22 ENCOUNTER — Other Ambulatory Visit: Payer: Self-pay | Admitting: Internal Medicine

## 2014-09-03 ENCOUNTER — Other Ambulatory Visit (INDEPENDENT_AMBULATORY_CARE_PROVIDER_SITE_OTHER): Payer: BC Managed Care – PPO

## 2014-09-03 DIAGNOSIS — E785 Hyperlipidemia, unspecified: Secondary | ICD-10-CM

## 2014-09-03 DIAGNOSIS — R739 Hyperglycemia, unspecified: Secondary | ICD-10-CM

## 2014-09-03 DIAGNOSIS — R945 Abnormal results of liver function studies: Secondary | ICD-10-CM

## 2014-09-03 DIAGNOSIS — R7309 Other abnormal glucose: Secondary | ICD-10-CM

## 2014-09-03 DIAGNOSIS — R7989 Other specified abnormal findings of blood chemistry: Secondary | ICD-10-CM

## 2014-09-03 LAB — LIPID PANEL
CHOLESTEROL: 197 mg/dL (ref 0–200)
HDL: 59.4 mg/dL (ref >39.00)
LDL CALC: 117 mg/dL — AB (ref 0–99)
NonHDL: 137.6
TRIGLYCERIDES: 102 mg/dL (ref 0.0–149.0)
Total CHOL/HDL Ratio: 3
VLDL: 20.4 mg/dL (ref 0.0–40.0)

## 2014-09-03 LAB — HEPATIC FUNCTION PANEL
ALT: 50 U/L — AB (ref 0–35)
AST: 53 U/L — ABNORMAL HIGH (ref 0–37)
Albumin: 3.7 g/dL (ref 3.5–5.2)
Alkaline Phosphatase: 66 U/L (ref 39–117)
BILIRUBIN DIRECT: 0.1 mg/dL (ref 0.0–0.3)
Total Bilirubin: 0.7 mg/dL (ref 0.2–1.2)
Total Protein: 7.2 g/dL (ref 6.0–8.3)

## 2014-09-03 LAB — BASIC METABOLIC PANEL
BUN: 11 mg/dL (ref 6–23)
CHLORIDE: 106 meq/L (ref 96–112)
CO2: 28 mEq/L (ref 19–32)
Calcium: 9.6 mg/dL (ref 8.4–10.5)
Creatinine, Ser: 0.8 mg/dL (ref 0.4–1.2)
GFR: 72.47 mL/min (ref >60.00)
Glucose, Bld: 130 mg/dL — ABNORMAL HIGH (ref 70–99)
POTASSIUM: 4.1 meq/L (ref 3.5–5.1)
Sodium: 141 mEq/L (ref 135–145)

## 2014-09-03 LAB — HEMOGLOBIN A1C: HEMOGLOBIN A1C: 6.4 % (ref 4.6–6.5)

## 2014-09-04 LAB — HEPATITIS C ANTIBODY: HCV AB: NEGATIVE

## 2014-09-04 LAB — HEPATITIS B SURFACE ANTIBODY,QUALITATIVE: Hep B S Ab: NEGATIVE

## 2014-09-04 LAB — HEPATITIS B SURFACE ANTIGEN: HEP B S AG: NEGATIVE

## 2014-09-09 ENCOUNTER — Encounter: Payer: Self-pay | Admitting: Internal Medicine

## 2014-09-09 ENCOUNTER — Ambulatory Visit (INDEPENDENT_AMBULATORY_CARE_PROVIDER_SITE_OTHER): Payer: BC Managed Care – PPO | Admitting: Internal Medicine

## 2014-09-09 VITALS — BP 130/64 | Temp 98.3°F | Ht 65.25 in | Wt 147.0 lb

## 2014-09-09 DIAGNOSIS — Z79899 Other long term (current) drug therapy: Secondary | ICD-10-CM

## 2014-09-09 DIAGNOSIS — I10 Essential (primary) hypertension: Secondary | ICD-10-CM

## 2014-09-09 DIAGNOSIS — R7989 Other specified abnormal findings of blood chemistry: Secondary | ICD-10-CM

## 2014-09-09 DIAGNOSIS — R945 Abnormal results of liver function studies: Secondary | ICD-10-CM

## 2014-09-09 DIAGNOSIS — R739 Hyperglycemia, unspecified: Secondary | ICD-10-CM

## 2014-09-09 DIAGNOSIS — E785 Hyperlipidemia, unspecified: Secondary | ICD-10-CM

## 2014-09-09 DIAGNOSIS — Z23 Encounter for immunization: Secondary | ICD-10-CM

## 2014-09-09 DIAGNOSIS — Z853 Personal history of malignant neoplasm of breast: Secondary | ICD-10-CM

## 2014-09-09 MED ORDER — NIACIN ER (ANTIHYPERLIPIDEMIC) 750 MG PO TBCR: 1500.0000 mg | EXTENDED_RELEASE_TABLET | Freq: Every day | ORAL | Status: DC

## 2014-09-09 MED ORDER — ESCITALOPRAM OXALATE 20 MG PO TABS: ORAL_TABLET | ORAL | Status: AC

## 2014-09-09 MED ORDER — LISINOPRIL-HYDROCHLOROTHIAZIDE 10-12.5 MG PO TABS: ORAL_TABLET | ORAL | Status: DC

## 2014-09-09 NOTE — Patient Instructions (Addendum)
Blood tests consistent with prediabetes. Continue blood pressure medication  Controlled. Niaspan 1500 mg per day we'll send in refill to pharmacy. Avoid excess Tylenol alcohol because of the liver panel abnormalities Intensify lifestyle changes diet cutting out sweets desserts simple carbs as we discussed.  Consider adding metformin for blood sugar control. Lab and followup visit in 3 months or as needed. Abnormal liver tests are probably from fatty liver but  abdominal ultrasound should be considered to rule out other causes. It is alos possible that niacin products can cause abnormal liver test.

## 2014-09-09 NOTE — Progress Notes (Signed)
Pre visit review using our clinic review tool, if applicable. No additional management support is needed unless otherwise documented below in the visit note.  Chief Complaint  Patient presents with  . Follow-up    yearly lipid  ht check    HPI: Ashley Hurley  comes in today for yearly check LIPIDS: now taking 2 750 per day niaspan  Denies se  Trying to avoid astatin BP: on med no se  Seems to be in contol Mood lexapro helpful would like to continue interim hx  No major injuries illness  takin g fishoil   ROS: See pertinent positives and negatives per HPI.no cp sob syncope gi su new past hx of breast cancer  On prn fu.  LIFESTYLE:  Exercise:  Yes  Tobacco/ETS:no  Alcohol: per day 1 Sugar beverages: no but likes sweets  [place] at end of day  Drug use: no  fam hx of  Dm   Past Medical History  Diagnosis Date  . Cancer     breast  . Hypertension   . Hyperlipidemia   . Allergy   . Chocolate cyst of ovary   . Breast cancer, right breast Jan 2001    IIBT3N0 radiation and chemo granfortuna    Family History  Problem Relation Age of Onset  . Coronary artery disease Mother 68  . Diabetes Mother   . Cancer Father 17    pancreatic  . Cancer Sister     breast  . Hypertension      family hx  . Hypertension Sister   . Stroke Sister   . Colon cancer Neg Hx   . Rectal cancer Neg Hx   . Stomach cancer Neg Hx     History   Social History  . Marital Status: Single    Spouse Name: N/A    Number of Children: N/A  . 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 Smoking status: Never Smoker   . Smokeless tobacco: None  . Alcohol Use: 0.0 oz/week    3-4 Glasses of wine per week  . Drug Use: No  . Sexual Activity: None   Other Topics Concern  . 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    Non smoker    Exercises some   G2P2   Tobacco   Etoh: ocass   caffiene coffee in am    [facility] of 2    Outpatient Encounter Prescriptions as of 09/09/2014  Medication Sig  . ASTRAGALUS PO Take by mouth.  Marland Kitchen CINNAMON PO Take by mouth.  . escitalopram (LEXAPRO) 20 MG tablet TAKE 1 TABLET EVERY DAY  . Garlic 315 MG TABS Take 400 mg by mouth daily.    Marland Kitchen lisinopril-hydrochlorothiazide (PRINZIDE,ZESTORETIC) 10-12.5 MG per tablet TAKE 1 TABLET BY MOUTH EVERY DAY  . Melatonin 5 MG TABS Take by mouth.  . Multiple Vitamin (MULTIVITAMIN) tablet Take 1 tablet by mouth daily.    . niacin (NIASPAN) 750 MG CR tablet Take 2 tablets (1,500 mg total) by mouth at bedtime.  . Omega-3 Fatty Acids (FISH OIL PO) Take 2 tablets by mouth daily.  Marland Kitchen VITAMIN D, CHOLECALCIFEROL, PO Take by mouth.    . [DISCONTINUED] escitalopram (LEXAPRO) 20 MG tablet TAKE 1 TABLET EVERY DAY  . [DISCONTINUED] lisinopril-hydrochlorothiazide (PRINZIDE,ZESTORETIC) 10-12.5 MG per tablet TAKE  1 TABLET BY MOUTH EVERY DAY  . [DISCONTINUED] niacin (NIASPAN) 750 MG CR tablet Take 2 tablets (1,500 mg total) by mouth at bedtime.  . [DISCONTINUED] Coenzyme Q10 10 MG capsule Take 10 mg by mouth daily.      EXAM:  BP 130/64  Temp(Src) 98.3 F (36.8 C) (Oral)  Ht 5' 5.25" (1.657 m)  Wt 147 lb (66.679 kg)  BMI 24.29 kg/m2  Body mass index is 24.29 kg/(m^2).  GENERAL: vitals reviewed and listed above, alert, oriented, appears well hydrated and in no acute distress HEENT: atraumatic, conjunctiva  clear, no obvious abnormalities on inspection of external nose and ears OP : no lesion edema or exudate  NECK: no obvious masses on inspection palpation  LUNGS: clear to auscultation bilaterally, no wheezes, rales or rhonchi, good air movement CV: HRRR, no clubbing cyanosis or  peripheral edema nl cap refill  Abdomen:  Sof,t normal bowel sounds without hepatosplenomegaly, no guarding rebound or masses no CVA tenderness MS: moves all extremities without noticeable  focal  abnormality PSYCH: pleasant and cooperative, no obvious depression or anxiety Lab Results  Component Value Date   WBC 6.3 08/07/2013   HGB 13.5 08/07/2013   HCT 40.0 08/07/2013   PLT 190.0 08/07/2013   GLUCOSE 130* 09/03/2014   CHOL 197 09/03/2014   TRIG 102.0 09/03/2014   HDL 59.40 09/03/2014   LDLDIRECT 112.7 05/10/2012   LDLCALC 117* 09/03/2014   ALT 50* 09/03/2014   AST 53* 09/03/2014   NA 141 09/03/2014   K 4.1 09/03/2014   CL 106 09/03/2014   CREATININE 0.8 09/03/2014   BUN 11 09/03/2014   CO2 28 09/03/2014   TSH 1.52 08/07/2013   HGBA1C 6.4 09/03/2014    ASSESSMENT AND PLAN:  Discussed the following assessment and plan:  Essential hypertension - continue medication  BREAST CANCER, HX OF  Abnormal LFTs - hx of neg serologies  declines [place] at this time  will folow ;suspect nash;avoid lvier toxic medsalbeit niaspan  is in the mix.   Hyperglycemia - pre diabetes  high risk range  disc optinos; wnats to wait on  metformin lsi [place] etc .  Hyperlipidemia  Need for prophylactic vaccination and inoculation against influenza - Plan: Flu Vaccine QUAD 36+ mos PF IM (Fluarix Quad PF)  Medication management - cont lexapro doing well .benefit more than risk  -Patient advised to return or notify health care team  if symptoms worsen ,persist or new concerns arise.  Patient Instructions  Blood tests consistent with prediabetes. Continue blood pressure medication  Controlled. Niaspan 1500 mg per day we'll send in refill to pharmacy. Avoid excess Tylenol alcohol because of the liver panel abnormalities Intensify lifestyle changes diet cutting out sweets desserts simple carbs as we discussed.  Consider adding metformin for blood sugar control. Lab and followup visit in 3 months or as needed. Abnormal liver tests are probably from fatty liver but  abdominal ultrasound should be considered to rule out other causes. It is alos possible that niacin products can cause abnormal liver test.     Standley Brooking. Panosh M.D. Total visit 75mins > 50% spent counseling and coordinating care    Regarding poptions rx for blood sugar elevation close fu is warranted

## 2014-09-10 ENCOUNTER — Telehealth: Payer: Self-pay | Admitting: Internal Medicine

## 2014-09-10 NOTE — Telephone Encounter (Signed)
emmi emailed °

## 2014-09-11 ENCOUNTER — Encounter: Payer: Self-pay | Admitting: Internal Medicine

## 2014-09-11 DIAGNOSIS — Z79899 Other long term (current) drug therapy: Secondary | ICD-10-CM | POA: Insufficient documentation

## 2014-12-16 ENCOUNTER — Other Ambulatory Visit: Payer: Self-pay

## 2014-12-16 ENCOUNTER — Telehealth: Payer: Self-pay | Admitting: Internal Medicine

## 2014-12-16 DIAGNOSIS — R945 Abnormal results of liver function studies: Secondary | ICD-10-CM

## 2014-12-16 DIAGNOSIS — R739 Hyperglycemia, unspecified: Secondary | ICD-10-CM

## 2014-12-16 DIAGNOSIS — I10 Essential (primary) hypertension: Secondary | ICD-10-CM

## 2014-12-16 DIAGNOSIS — R7989 Other specified abnormal findings of blood chemistry: Secondary | ICD-10-CM

## 2014-12-16 NOTE — Telephone Encounter (Signed)
Pt called to say that Dr Regis Bill wanted her to have labs. Please verify and if so please put labs in .Marland Kitchen

## 2014-12-16 NOTE — Telephone Encounter (Signed)
Ok to schedule appt; labs ordered.  Per last visit notes pt should follow up:  Return in about 3 months (around 12/10/2014) for bmp hga1c lfts previsit.

## 2014-12-17 NOTE — Telephone Encounter (Signed)
PT HAS BEEN SCHEDULED

## 2014-12-23 ENCOUNTER — Other Ambulatory Visit (INDEPENDENT_AMBULATORY_CARE_PROVIDER_SITE_OTHER): Payer: BC Managed Care – PPO

## 2014-12-23 DIAGNOSIS — R739 Hyperglycemia, unspecified: Secondary | ICD-10-CM

## 2014-12-23 DIAGNOSIS — I10 Essential (primary) hypertension: Secondary | ICD-10-CM

## 2014-12-23 DIAGNOSIS — R7989 Other specified abnormal findings of blood chemistry: Secondary | ICD-10-CM

## 2014-12-23 DIAGNOSIS — R945 Abnormal results of liver function studies: Secondary | ICD-10-CM

## 2014-12-23 LAB — HEPATIC FUNCTION PANEL
ALBUMIN: 3.8 g/dL (ref 3.5–5.2)
ALT: 24 U/L (ref 0–35)
AST: 23 U/L (ref 0–37)
Alkaline Phosphatase: 61 U/L (ref 39–117)
BILIRUBIN DIRECT: 0.1 mg/dL (ref 0.0–0.3)
TOTAL PROTEIN: 6.8 g/dL (ref 6.0–8.3)
Total Bilirubin: 0.5 mg/dL (ref 0.2–1.2)

## 2014-12-23 LAB — BASIC METABOLIC PANEL
BUN: 13 mg/dL (ref 6–23)
CO2: 28 mEq/L (ref 19–32)
Calcium: 9.5 mg/dL (ref 8.4–10.5)
Chloride: 102 mEq/L (ref 96–112)
Creatinine, Ser: 0.76 mg/dL (ref 0.40–1.20)
GFR: 81.27 mL/min (ref >60.00)
GLUCOSE: 165 mg/dL — AB (ref 70–99)
POTASSIUM: 3.9 meq/L (ref 3.5–5.1)
Sodium: 136 mEq/L (ref 135–145)

## 2014-12-23 LAB — HEMOGLOBIN A1C: Hgb A1c MFr Bld: 6.4 % (ref 4.6–6.5)

## 2015-01-04 ENCOUNTER — Encounter: Payer: Self-pay | Admitting: Internal Medicine

## 2015-05-01 ENCOUNTER — Ambulatory Visit (INDEPENDENT_AMBULATORY_CARE_PROVIDER_SITE_OTHER): Payer: BC Managed Care – PPO | Admitting: Family Medicine

## 2015-05-01 ENCOUNTER — Other Ambulatory Visit: Payer: Self-pay | Admitting: Internal Medicine

## 2015-05-01 ENCOUNTER — Encounter: Payer: Self-pay | Admitting: Family Medicine

## 2015-05-01 VITALS — BP 114/85 | HR 142 | Temp 98.5°F | Ht 65.25 in | Wt 139.0 lb

## 2015-05-01 DIAGNOSIS — K219 Gastro-esophageal reflux disease without esophagitis: Secondary | ICD-10-CM

## 2015-05-01 MED ORDER — OMEPRAZOLE 40 MG PO CPDR: 40.0000 mg | DELAYED_RELEASE_CAPSULE | Freq: Every day | ORAL | Status: DC

## 2015-05-01 NOTE — Telephone Encounter (Signed)
Pt states dr fry was to call her in rx for acid reflux. But  I do not see in AVS Pt would like [facility]

## 2015-05-01 NOTE — Telephone Encounter (Signed)
Call in Omeprazole 40 mg daily, #30 with 2 rf

## 2015-05-01 NOTE — Progress Notes (Signed)
Pre visit review using our clinic review tool, if applicable. No additional management support is needed unless otherwise documented below in the visit note. 

## 2015-05-01 NOTE — Progress Notes (Signed)
   Subjective:    Patient ID: Jullisa Grigoryan, female    DOB: 04-Dec-1951, 64 y.o.   MRN: [id number]  HPI Here for one month of intermittent nausea and occasional vomiting. This can be any time of day, and it is not related to eating. No abdominal pain. She does have frequent heartburn however, and she does not take anything for this. Her BMs are regular.    Review of Systems  Constitutional: Negative.   Respiratory: Negative.   Cardiovascular: Negative.   Gastrointestinal: Positive for nausea and vomiting. Negative for abdominal pain, diarrhea, constipation, blood in stool, abdominal distention, anal bleeding and rectal pain.  Genitourinary: Negative.        Objective:   Physical Exam  Constitutional: She appears well-developed and well-nourished.  Neck: No thyromegaly present.  Cardiovascular: Normal rate, regular rhythm, normal heart sounds and intact distal pulses.   Pulmonary/Chest: Effort normal and breath sounds normal.  Abdominal: Soft. Bowel sounds are normal. She exhibits no distension and no mass. There is no tenderness. There is no rebound and no guarding.  Lymphadenopathy:    She has no cervical adenopathy.          Assessment & Plan:  Her nausea may be an effect of GERD. Try Omeprazole 40 mg daily. Recheck prn

## 2015-05-01 NOTE — Telephone Encounter (Signed)
Rx sent to pharmacy. Called and spoke with pt and pt is aware.

## 2015-05-14 ENCOUNTER — Ambulatory Visit (INDEPENDENT_AMBULATORY_CARE_PROVIDER_SITE_OTHER): Payer: BC Managed Care – PPO | Admitting: Internal Medicine

## 2015-05-14 ENCOUNTER — Encounter: Payer: Self-pay | Admitting: Internal Medicine

## 2015-05-14 VITALS — BP 90/60 | HR 100 | Temp 98.0°F | Ht 66.25 in | Wt 132.2 lb

## 2015-05-14 DIAGNOSIS — R945 Abnormal results of liver function studies: Secondary | ICD-10-CM

## 2015-05-14 DIAGNOSIS — N289 Disorder of kidney and ureter, unspecified: Secondary | ICD-10-CM

## 2015-05-14 DIAGNOSIS — R031 Nonspecific low blood-pressure reading: Secondary | ICD-10-CM | POA: Diagnosis not present

## 2015-05-14 DIAGNOSIS — R112 Nausea with vomiting, unspecified: Secondary | ICD-10-CM | POA: Diagnosis not present

## 2015-05-14 DIAGNOSIS — R634 Abnormal weight loss: Secondary | ICD-10-CM | POA: Diagnosis not present

## 2015-05-14 DIAGNOSIS — R7989 Other specified abnormal findings of blood chemistry: Secondary | ICD-10-CM

## 2015-05-14 LAB — CBC WITH DIFFERENTIAL/PLATELET
BASOS ABS: 0 10*3/uL (ref 0.0–0.1)
BASOS PCT: 0.2 % (ref 0.0–3.0)
EOS ABS: 0 10*3/uL (ref 0.0–0.7)
Eosinophils Relative: 0.4 % (ref 0.0–5.0)
HCT: 39 % (ref 36.0–46.0)
Hemoglobin: 13.2 g/dL (ref 12.0–15.0)
Lymphocytes Relative: 16.9 % (ref 12.0–46.0)
Lymphs Abs: 1.9 10*3/uL (ref 0.7–4.0)
MCHC: 33.9 g/dL (ref 30.0–36.0)
MCV: 95.3 fl (ref 78.0–100.0)
MONOS PCT: 5.4 % (ref 3.0–12.0)
Monocytes Absolute: 0.6 10*3/uL (ref 0.1–1.0)
NEUTROS ABS: 8.5 10*3/uL — AB (ref 1.4–7.7)
Neutrophils Relative %: 77.1 % — ABNORMAL HIGH (ref 43.0–77.0)
Platelets: 350 10*3/uL (ref 150.0–400.0)
RBC: 4.1 Mil/uL (ref 3.87–5.11)
RDW: 16 % — ABNORMAL HIGH (ref 11.5–15.5)
WBC: 11 10*3/uL — AB (ref 4.0–10.5)

## 2015-05-14 LAB — TSH: TSH: 3.46 u[IU]/mL (ref 0.35–4.50)

## 2015-05-14 LAB — HEPATIC FUNCTION PANEL
ALBUMIN: 3.5 g/dL (ref 3.5–5.2)
ALT: 74 U/L — AB (ref 0–35)
AST: 108 U/L — ABNORMAL HIGH (ref 0–37)
Alkaline Phosphatase: 68 U/L (ref 39–117)
BILIRUBIN DIRECT: 0.2 mg/dL (ref 0.0–0.3)
TOTAL PROTEIN: 6.2 g/dL (ref 6.0–8.3)
Total Bilirubin: 0.9 mg/dL (ref 0.2–1.2)

## 2015-05-14 LAB — BASIC METABOLIC PANEL
BUN: 30 mg/dL — ABNORMAL HIGH (ref 6–23)
CALCIUM: 9.2 mg/dL (ref 8.4–10.5)
CO2: 23 mEq/L (ref 19–32)
Chloride: 95 mEq/L — ABNORMAL LOW (ref 96–112)
Creatinine, Ser: 1.71 mg/dL — ABNORMAL HIGH (ref 0.40–1.20)
GFR: 31.84 mL/min — ABNORMAL LOW (ref >60.00)
Glucose, Bld: 206 mg/dL — ABNORMAL HIGH (ref 70–99)
Potassium: 3.3 mEq/L — ABNORMAL LOW (ref 3.5–5.1)
SODIUM: 129 meq/L — AB (ref 135–145)

## 2015-05-14 LAB — HEMOGLOBIN A1C: Hgb A1c MFr Bld: 6.4 % (ref 4.6–6.5)

## 2015-05-14 LAB — T4, FREE: FREE T4: 1.32 ng/dL (ref 0.60–1.60)

## 2015-05-14 LAB — SEDIMENTATION RATE: Sed Rate: 14 mm/hr (ref 0–22)

## 2015-05-14 NOTE — Progress Notes (Signed)
Pre visit review using our clinic review tool, if applicable. No additional management support is needed unless otherwise documented below in the visit note.  Chief Complaint  Patient presents with  . Nausea  . Emesis  . Weight Loss  . Loss of Appetite    HPI: Patient Ashley Hurley  comes in today for   problem evaluation. Here with partner. Saw dr fry almost 2 weeks ago and thought could be gerd andPut on prilosec generic  omeprezole     Vomiting.  In opm  And losing weight.  Insidious onset   Food  Particular  previously  Months ago and then  then last last month  Worsening.  "Fine in am " has appetite but doesn't each much as feels full   Vomiting in pm . Evening ? Early satiety .  Not nocturnal sx.  ocass acid reflux  Helped by PPI. Every day or oevery other day .vomitng now  Feels tired  And dizzy but no  Syncope cp or  exercise  No polyuria  Drinking reg liquids  No dyshpagia  Still taking all meds and list of supplements  No real pain uri sx fever .   ROS: See pertinent positives and negatives per HPI.  Past Medical History  Diagnosis Date  . Cancer     breast  . Hypertension   . Hyperlipidemia   . Allergy   . Chocolate cyst of ovary   . Breast cancer, right breast Jan 2001    IIBT3N0 radiation and chemo granfortuna    Family History  Problem Relation Age of Onset  . Coronary artery disease Mother 69  . Diabetes Mother   . Cancer Father 68    pancreatic  . Cancer Sister     breast  . Hypertension      family hx  . Hypertension Sister   . Stroke Sister   . Colon cancer Neg Hx   . Rectal cancer Neg Hx   . Stomach cancer Neg Hx     History   Social History  . Marital Status: Single    Spouse Name: N/A  . Number of Children: N/A  . 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 Smoking status: Never Smoker   . Smokeless tobacco: Not on file  . Alcohol Use: 0.0 oz/week    3-4 Glasses of wine  per week  . Drug Use: No  . Sexual Activity: Not on file   Other Topics Concern  . 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    Non smoker    Exercises some   G2P2   Tobacco   Etoh: ocass   caffiene coffee in am    [facility] [employer] [facility]    Outpatient Prescriptions Prior to Visit  Medication Sig Dispense Refill  . ASTRAGALUS PO Take by mouth.    Marland Kitchen CINNAMON PO Take by mouth.    . escitalopram (LEXAPRO) 20 MG tablet TAKE 1 TABLET EVERY DAY 30 tablet 12  . Garlic 419 MG TABS Take 400 mg by mouth daily.      Marland Kitchen lisinopril-hydrochlorothiazide (PRINZIDE,ZESTORETIC) 10-12.5 MG per tablet TAKE 1 TABLET BY MOUTH EVERY DAY 90 tablet 3  . Melatonin 5 MG TABS Take by mouth.    . Multiple Vitamin (MULTIVITAMIN) tablet Take 1 tablet  by mouth daily.      . niacin (NIASPAN) 750 MG CR tablet Take 2 tablets (1,500 mg total) by mouth at bedtime. 60 tablet 11  . Omega-3 Fatty Acids (FISH OIL PO) Take 2 tablets by mouth daily.    Marland Kitchen omeprazole (PRILOSEC) 40 MG capsule Take 1 capsule (40 mg total) by mouth daily. 30 capsule 2  . VITAMIN D, CHOLECALCIFEROL, PO Take by mouth.       No facility-administered medications prior to visit.     EXAM:  BP 90/60 mmHg  Pulse 100  Temp(Src) 98 F (36.7 C) (Oral)  Ht 5' 6.25" (1.683 m)  Wt 132 lb 3.2 oz (59.966 kg)  BMI 21.17 kg/m2  Body mass index is 21.17 kg/(m^2). Very hard to ehar her bp  Done by palpation  88-90 range  GENERAL: vitals reviewed and listed above, alert, oriented, appears well hydrated and in no acute distress looks mildly ill no nontoxic alert cognition intact  HEENT: atraumatic, conjunctiva  clear, no obvious abnormalities on inspection of external nose and ears OP : no lesion edema or exudate  Moist membranes NECK: no obvious masses on inspection palpation  LUNGS: clear to auscultation bilaterally, no wheezes, rales or rhonchi, good air  movement CV: HRRR, no clubbing cyanosis or  peripheral edema nl cap refill  Pulses present  Abdomen:  Sof,t normal bowel sounds , no guarding rebound or masses no CVA tenderness liver at rcm nt ? Spleen  Not felt ?  MS: moves all extremities without noticeable focal  abnormality PSYCH: pleasant and cooperative, no obvious depression or anxiety  Wt Readings from Last 3 Encounters:  05/14/15 132 lb 3.2 oz (59.966 kg)  05/01/15 139 lb (63.05 kg)  09/09/14 147 lb (66.679 kg)   BP Readings from Last 3 Encounters:  05/14/15 90/60  05/01/15 114/85  09/09/14 130/64     ASSESSMENT AND PLAN:  Discussed the following assessment and plan:  Loss of weight - Plan: Basic metabolic panel, CBC with Differential/Platelet, Hemoglobin A1c, Hepatic function panel, TSH, T4, free, POCT urinalysis dipstick, Sedimentation rate  Nausea and vomiting, vomiting of unspecified type - Plan: Basic metabolic panel, CBC with Differential/Platelet, Hemoglobin A1c, Hepatic function panel, TSH, T4, free, POCT urinalysis dipstick, Sedimentation rate  Low blood pressure reading - with tachy / hydraion certainly stop  meds ed for alarm sx close fu advised  Very concerning sx  Check metabolic anemia thyroid  And if not explain  then gi evaluation asap. Stop all meds supplements except ppi and lexapro  -Patient advised to return or notify health care team  if symptoms worsen ,persist or new concerns arise.  Patient Instructions  Stop the blood pressure medicine all the supplements and the Niaspan can remain on the Lexapro and the omeprazole at this time.  Your blood pressure is low like dehydration increase fluids as much as possible we are getting lab tests to check for anemia diabetes other metabolic difficulties. We'll plan to get GI involved unless we come up with a diagnosis not related. sometimes we do an abdominal ultrasound or other kind of imaging study depending on what your labs show.  If you or getting worse  contact the on-call team.   Standley Brooking. Mehtaab Mayeda M.D.

## 2015-05-14 NOTE — Patient Instructions (Signed)
Stop the blood pressure medicine all the supplements and the Niaspan can remain on the Lexapro and the omeprazole at this time.  Your blood pressure is low like dehydration increase fluids as much as possible we are getting lab tests to check for anemia diabetes other metabolic difficulties. We'll plan to get GI involved unless we come up with a diagnosis not related. sometimes we do an abdominal ultrasound or other kind of imaging study depending on what your labs show.  If you or getting worse contact the on-call team.

## 2015-05-15 ENCOUNTER — Telehealth: Payer: Self-pay | Admitting: Family Medicine

## 2015-05-15 ENCOUNTER — Other Ambulatory Visit: Payer: Self-pay | Admitting: Family Medicine

## 2015-05-15 DIAGNOSIS — R112 Nausea with vomiting, unspecified: Secondary | ICD-10-CM

## 2015-05-15 DIAGNOSIS — E871 Hypo-osmolality and hyponatremia: Secondary | ICD-10-CM

## 2015-05-15 DIAGNOSIS — N289 Disorder of kidney and ureter, unspecified: Secondary | ICD-10-CM

## 2015-05-15 DIAGNOSIS — R7989 Other specified abnormal findings of blood chemistry: Secondary | ICD-10-CM

## 2015-05-15 LAB — POCT URINALYSIS DIPSTICK
Blood, UA: NEGATIVE
Glucose, UA: NEGATIVE
Nitrite, UA: NEGATIVE
Spec Grav, UA: 1.025
UROBILINOGEN UA: 0.2
pH, UA: 5.5

## 2015-05-15 NOTE — Telephone Encounter (Signed)
lmom for pt to call office asap

## 2015-05-15 NOTE — Telephone Encounter (Signed)
Pt needs a lab appt for STAT BMP on Monday.  Follow Up with Dr. Regis Bill on Tuesday.  I have placed the lab order.  Please help her to schedule these appointments ASAP.  Thanks!

## 2015-05-15 NOTE — Telephone Encounter (Signed)
Pt has been sch

## 2015-05-18 ENCOUNTER — Ambulatory Visit
Admission: RE | Admit: 2015-05-18 | Discharge: 2015-05-18 | Disposition: A | Discharge status: Home / Self Care | Payer: BC Managed Care – PPO | Source: Ambulatory Visit | Attending: Internal Medicine | Admitting: Internal Medicine

## 2015-05-18 ENCOUNTER — Other Ambulatory Visit (INDEPENDENT_AMBULATORY_CARE_PROVIDER_SITE_OTHER): Payer: BC Managed Care – PPO

## 2015-05-18 DIAGNOSIS — N289 Disorder of kidney and ureter, unspecified: Secondary | ICD-10-CM | POA: Diagnosis not present

## 2015-05-18 DIAGNOSIS — R031 Nonspecific low blood-pressure reading: Secondary | ICD-10-CM

## 2015-05-18 DIAGNOSIS — R748 Abnormal levels of other serum enzymes: Secondary | ICD-10-CM | POA: Diagnosis not present

## 2015-05-18 DIAGNOSIS — E871 Hypo-osmolality and hyponatremia: Secondary | ICD-10-CM | POA: Diagnosis not present

## 2015-05-18 DIAGNOSIS — R634 Abnormal weight loss: Secondary | ICD-10-CM

## 2015-05-18 DIAGNOSIS — R7989 Other specified abnormal findings of blood chemistry: Secondary | ICD-10-CM

## 2015-05-18 DIAGNOSIS — R112 Nausea with vomiting, unspecified: Secondary | ICD-10-CM

## 2015-05-18 LAB — POCT URINALYSIS DIPSTICK
Blood, UA: NEGATIVE
Glucose, UA: NEGATIVE
KETONES UA: NEGATIVE
LEUKOCYTES UA: NEGATIVE
NITRITE UA: NEGATIVE
PH UA: 5.5
Spec Grav, UA: 1.025
UROBILINOGEN UA: 0.2

## 2015-05-18 LAB — BASIC METABOLIC PANEL
BUN: 16 mg/dL (ref 6–23)
CO2: 25 mEq/L (ref 19–32)
CREATININE: 1.13 mg/dL (ref 0.40–1.20)
Calcium: 8.7 mg/dL (ref 8.4–10.5)
Chloride: 98 mEq/L (ref 96–112)
GFR: 51.36 mL/min — AB (ref >60.00)
Glucose, Bld: 150 mg/dL — ABNORMAL HIGH (ref 70–99)
Potassium: 4.3 mEq/L (ref 3.5–5.1)
Sodium: 131 mEq/L — ABNORMAL LOW (ref 135–145)

## 2015-05-19 ENCOUNTER — Ambulatory Visit (INDEPENDENT_AMBULATORY_CARE_PROVIDER_SITE_OTHER): Payer: BC Managed Care – PPO | Admitting: Internal Medicine

## 2015-05-19 ENCOUNTER — Ambulatory Visit (INDEPENDENT_AMBULATORY_CARE_PROVIDER_SITE_OTHER)
Admission: RE | Admit: 2015-05-19 | Discharge: 2015-05-19 | Disposition: A | Discharge status: Home / Self Care | Payer: BC Managed Care – PPO | Source: Ambulatory Visit | Attending: Internal Medicine | Admitting: Internal Medicine

## 2015-05-19 ENCOUNTER — Telehealth: Payer: Self-pay | Admitting: Internal Medicine

## 2015-05-19 ENCOUNTER — Encounter: Payer: Self-pay | Admitting: Internal Medicine

## 2015-05-19 VITALS — BP 98/60 | Temp 98.0°F | Ht 66.25 in | Wt 131.8 lb

## 2015-05-19 DIAGNOSIS — R1906 Epigastric swelling, mass or lump: Secondary | ICD-10-CM

## 2015-05-19 DIAGNOSIS — R748 Abnormal levels of other serum enzymes: Secondary | ICD-10-CM | POA: Diagnosis not present

## 2015-05-19 DIAGNOSIS — N1339 Other hydronephrosis: Secondary | ICD-10-CM | POA: Diagnosis not present

## 2015-05-19 DIAGNOSIS — K869 Disease of pancreas, unspecified: Secondary | ICD-10-CM

## 2015-05-19 DIAGNOSIS — N9489 Other specified conditions associated with female genital organs and menstrual cycle: Secondary | ICD-10-CM

## 2015-05-19 DIAGNOSIS — R112 Nausea with vomiting, unspecified: Secondary | ICD-10-CM

## 2015-05-19 DIAGNOSIS — R7989 Other specified abnormal findings of blood chemistry: Secondary | ICD-10-CM

## 2015-05-19 DIAGNOSIS — K7689 Other specified diseases of liver: Secondary | ICD-10-CM

## 2015-05-19 DIAGNOSIS — K769 Liver disease, unspecified: Secondary | ICD-10-CM

## 2015-05-19 DIAGNOSIS — N949 Unspecified condition associated with female genital organs and menstrual cycle: Secondary | ICD-10-CM

## 2015-05-19 DIAGNOSIS — R634 Abnormal weight loss: Secondary | ICD-10-CM

## 2015-05-19 DIAGNOSIS — K8689 Other specified diseases of pancreas: Secondary | ICD-10-CM

## 2015-05-19 MED ORDER — IOHEXOL 300 MG/ML  SOLN
100.0000 mL | Freq: Once | INTRAMUSCULAR | Status: AC | PRN
  Administered 2015-05-19: 80 mL via INTRAVENOUS

## 2015-05-19 MED ORDER — ONDANSETRON 4 MG PO TBDP: 4.0000 mg | ORAL_TABLET | Freq: Three times a day (TID) | ORAL | Status: DC | PRN

## 2015-05-19 NOTE — Progress Notes (Signed)
Pre visit review using our clinic review tool, if applicable. No additional management support is needed unless otherwise documented below in the visit note.  Chief Complaint  Patient presents with  . Follow-up    HPI: Fu of weight loss vomiting and elevated creatinine since last visit feels slightly better not as dizzy still hard to keep food down didn't vomit for a number of days until last night. Had a smoothie this morning. Had lab and ultrasound done. No fever no significant pain although some discomfort states no change in bowel habits.  ROS: See pertinent positives and negatives per HPI.  Past Medical History  Diagnosis Date  . Cancer     breast  . Hypertension   . Hyperlipidemia   . Allergy   . Chocolate cyst of ovary   . Breast cancer, right breast Jan 2001    IIBT3N0 radiation and chemo granfortuna    Family History  Problem Relation Age of Onset  . Coronary artery disease Mother 43  . Diabetes Mother   . Cancer Father 56    pancreatic  . Cancer Sister     breast  . Hypertension      family hx  . Hypertension Sister   . Stroke Sister   . Colon cancer Neg Hx   . Rectal cancer Neg Hx   . Stomach cancer Neg Hx     History   Social History  . Marital Status: Single    Spouse Name: N/A  . Number of Children: N/A  . 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 Smoking status: Never Smoker   . Smokeless tobacco: Not on file  . Alcohol Use: 0.0 oz/week    3-4 Glasses of wine per week  . Drug Use: No  . Sexual Activity: Not on file   Other Topics Concern  . 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    Non smoker    Exercises some   G2P2   Tobacco   Etoh: ocass   caffiene coffee in am    [facility]    Outpatient Prescriptions Prior to Visit  Medication Sig Dispense Refill  . omeprazole  (PRILOSEC) 40 MG capsule Take 1 capsule (40 mg total) by mouth daily. 30 capsule 2  . ASTRAGALUS PO Take by mouth.    Marland Kitchen CINNAMON PO Take by mouth.    . escitalopram (LEXAPRO) 20 MG tablet TAKE 1 TABLET EVERY DAY (Patient not taking: Reported on 05/19/2015) 30 tablet 12  . Garlic 761 MG TABS Take 400 mg by mouth daily.      Marland Kitchen lisinopril-hydrochlorothiazide (PRINZIDE,ZESTORETIC) 10-12.5 MG per tablet TAKE 1 TABLET BY MOUTH EVERY DAY (Patient not taking: Reported on 05/19/2015) 90 tablet 3  . Melatonin 5 MG TABS Take by mouth.    . Multiple Vitamin (MULTIVITAMIN) tablet Take 1 tablet by mouth daily.      . niacin (NIASPAN) 750 MG CR tablet Take 2 tablets (1,500 mg total) by mouth at bedtime. (Patient not taking: Reported on 05/19/2015) 60 tablet 11  . Omega-3 Fatty Acids (FISH OIL PO) Take 2 tablets by mouth daily.    Marland Kitchen VITAMIN D, CHOLECALCIFEROL, PO Take by mouth.       No facility-administered medications prior to visit.     EXAM:  BP  98/60 mmHg  Temp(Src) 98 F (36.7 C) (Oral)  Ht 5' 6.25" (1.683 m)  Wt 131 lb 12.8 oz (59.784 kg)  BMI 21.11 kg/m2  Body mass index is 21.11 kg/(m^2).  GENERAL: vitals reviewed and listed above, alert, oriented, appears well hydrated and in no acute distress HEENT: atraumatic, conjunctiva  clear, no obvious abnormalities on inspection of external nose and ears  Abdomen soft bowel sounds equal prominent left lobe of liver no epigastric pain or rebound. MS: moves all extremities without noticeable focal  abnormality cardiac S1-S2 no gallops or murmurs heart rate is 90 PSYCH: pleasant and cooperative skin turgor is normal nonicteric Lab Results  Component Value Date   WBC 11.0* 05/14/2015   HGB 13.2 05/14/2015   HCT 39.0 05/14/2015   PLT 350.0 05/14/2015   GLUCOSE 150* 05/18/2015   CHOL 197 09/03/2014   TRIG 102.0 09/03/2014   HDL 59.40 09/03/2014   LDLDIRECT 112.7 05/10/2012   LDLCALC 117* 09/03/2014   ALT 74* 05/14/2015   AST 108* 05/14/2015   NA  131* 05/18/2015   K 4.3 05/18/2015   CL 98 05/18/2015   CREATININE 1.13 05/18/2015   BUN 16 05/18/2015   CO2 25 05/18/2015   TSH 3.46 05/14/2015   HGBA1C 6.4 05/14/2015    ASSESSMENT AND PLAN:  Discussed the following assessment and plan:  Nodule on liver - complex on [place]  need imaging - Plan: CT Abdomen Pelvis W Contrast, Ambulatory referral to Gastroenterology, Ambulatory referral to Oncology  Liver lesion, left lobe - Plan: Ambulatory referral to Oncology, Ambulatory referral to Interventional Radiology  Epigastric mass poss on [place] - Plan: CT Abdomen Pelvis W Contrast, Ambulatory referral to Gastroenterology  Elevated serum creatinine - better than last week .  need hydration .   Loss of weight - Plan: Ambulatory referral to Gastroenterology  Nausea and vomiting, vomiting of unspecified type - Plan: Ambulatory referral to Gastroenterology  Other hydronephrosis - no obv instrinsic renal disease - Plan: CT Abdomen Pelvis W Contrast  Pancreatic mass - Plan: Ambulatory referral to Oncology, Ambulatory referral to Interventional Radiology  Adnexal mass - Plan: Ambulatory referral to Oncology, Ambulatory referral to Interventional Radiology All meds are being held except the omeprazole and half dose of Lexapro. We need to get scan as soon as possible with renal protection to delineate. Appointment with GI stat also. If getting worse may need to go to ED for hydration although looks better than she did last week.  Can try zofran    -Patient advised to return or notify health care team  if symptoms worsen ,persist or new concerns arise.  Patient Instructions  Plan imaging study  Asap.  And then plan fu   Standley Brooking. Panosh M.D.  CT scan report shows pancreatic mass lesion probable cancer and alesion in the liver that could be a met and  fatty infiltration versus a neoplasm and a right adnexal mass is pushing on the ureter.   Called patient directly and discuss results with her.  I  contacted GI and Dr. Ardis Hughs reviewed the scan and he  felt best first step would be to try interventional radiology for liver biopsy of the lesion before doing EUS.  We'll plan on getting oncology involved as soon as possible also.  SHe should be contacted in the morning about appointments and plans.   Total visit 28mns > 50% spent counseling and coordinating care as indicated in above note and in instructions to patient .

## 2015-05-19 NOTE — Patient Instructions (Addendum)
Plan imaging study  Asap.  And then plan fu

## 2015-05-19 NOTE — Telephone Encounter (Signed)
Pt is aware waiting for md to review

## 2015-05-19 NOTE — Telephone Encounter (Signed)
Ashley Hurley;  I looked at the CT images.  There is a clear pancreatic mass and also a lesion in liver that is probably a metastatic site.  I recommend she undergo interventional radiology biopsy of the liver mass.  If this is positive for adenocarcinoma than it would 'upstage' her most appropriately.  If IR does not think they can sample the liver lesion, then EUS would be appropriate.  She should also be referred to medical oncology as well.    thanks

## 2015-05-19 NOTE — Telephone Encounter (Signed)
Ashley Hurley from [place] CT wanted to inform nurse that pt results were up.

## 2015-05-19 NOTE — Telephone Encounter (Signed)
Patient of Dr. Fuller Plan (colonoscopy 2013). Contacted by Dr. Regis Bill (as Dr. of the day) regarding the patient's CT scan from earlier today. 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 EUS recommended. Dr. Regis Bill to contact the patient later today with results. I will forward this to Dr. Fuller Plan and Dr. Ardis Hughs regarding a next best step in this patient's evaluation. I told Dr. Regis Bill that our office will contact the patient sometime tomorrow (Wednesday, June 15) regarding recommendations.

## 2015-05-20 ENCOUNTER — Other Ambulatory Visit: Payer: Self-pay | Admitting: Internal Medicine

## 2015-05-20 DIAGNOSIS — K769 Liver disease, unspecified: Secondary | ICD-10-CM

## 2015-05-20 DIAGNOSIS — N9489 Other specified conditions associated with female genital organs and menstrual cycle: Secondary | ICD-10-CM

## 2015-05-20 DIAGNOSIS — K8689 Other specified diseases of pancreas: Secondary | ICD-10-CM

## 2015-05-20 LAB — URINE CULTURE

## 2015-05-20 NOTE — Telephone Encounter (Signed)
I spoke with Dr. Velora Mediate office, she has already ordered the IR consult and the referral to Medical Oncology.

## 2015-05-21 ENCOUNTER — Other Ambulatory Visit: Payer: Self-pay | Admitting: Interventional Radiology

## 2015-05-21 ENCOUNTER — Ambulatory Visit
Admission: RE | Admit: 2015-05-21 | Discharge: 2015-05-21 | Disposition: A | Discharge status: Home / Self Care | Payer: BC Managed Care – PPO | Source: Ambulatory Visit | Attending: Internal Medicine | Admitting: Internal Medicine

## 2015-05-21 DIAGNOSIS — K769 Liver disease, unspecified: Secondary | ICD-10-CM | POA: Insufficient documentation

## 2015-05-21 DIAGNOSIS — R160 Hepatomegaly, not elsewhere classified: Secondary | ICD-10-CM | POA: Insufficient documentation

## 2015-05-21 DIAGNOSIS — N9489 Other specified conditions associated with female genital organs and menstrual cycle: Secondary | ICD-10-CM

## 2015-05-21 DIAGNOSIS — K8689 Other specified diseases of pancreas: Secondary | ICD-10-CM | POA: Insufficient documentation

## 2015-05-21 NOTE — Consult Note (Signed)
Chief Complaint: Chief Complaint  Patient presents with  . Advice Only    Consult for Left lobe liver lesion/pancreatic mass     Referring Physician(s): Dr. Burnis Medin  History of Present Illness: Ashley Hurley is a 64 y.o. female kindly referred by Dr. Regis Bill for evaluation of potential image-guided biopsy of a left liver lesion identified on a recent US and CT.  The CT also shows a pancreatic mass.    Ashley Hurley presents today for evaluation with her partner and her daughter, and states that she has been in her usual state of health until the last few months when she started experiencing chronic nausea, occasional vomiting, and unintentional weight loss. She denies any significant anorexia, and she feels that her appetite has been relatively maintained though she can only eat a small amount of food before satiety.  Mostly she has been using meal-replacements such as smoothies recently.   She denies any other associated symptoms at this time, and reports normal bowel habits and normal urination. She has not had any recent episodes of vomiting. No significant abdominal pain or bloating symptoms. No history of chest pain or shortness of breath.  Ashley Hurley's oncologic history is significant for a prior right-sided breast carcinoma for which she was successfully treated with Dr. Beryle Beams.   I had a discussion with her and her family regarding the risk benefit analysis for a percutaneous biopsy of the liver mass. I do feel it is important to get a tissue diagnosis, as at this time it is uncertain of the pathology type. Specifically I talked about the risks of the biopsy including infection, bleeding, abdominal organ injury. I think that the benefit of determining the tissue type primarily lies with her plan of care and staging.    Past Medical History  Diagnosis Date  . Cancer     breast  . Hypertension   . Hyperlipidemia   . Allergy   . Chocolate cyst of ovary   . Breast  cancer, right breast Jan 2001    [id number] radiation and chemo [place]a    Past Surgical History  Procedure Laterality Date  . Breast surgery      rt mastectomy  . Oophorectomy      Allergies: Aspirin  Medications: Prior to Admission medications   Medication Sig Start Date End Date Taking? Authorizing Provider  escitalopram (LEXAPRO) 20 MG tablet TAKE 1 TABLET EVERY DAY 09/09/14  Yes Burnis Medin, MD  omeprazole (PRILOSEC) 40 MG capsule Take 1 capsule (40 mg total) by mouth daily. 05/01/15  Yes Laurey Morale, MD  ASTRAGALUS PO Take by mouth.    Historical Provider, MD  CINNAMON PO Take by mouth.    Historical Provider, MD  Garlic 081 MG TABS Take 400 mg by mouth daily.      Historical Provider, MD  lisinopril-hydrochlorothiazide (PRINZIDE,ZESTORETIC) 10-12.5 MG per tablet TAKE 1 TABLET BY MOUTH EVERY DAY Patient not taking: Reported on 05/21/2015 09/09/14   Burnis Medin, MD  Melatonin 5 MG TABS Take by mouth.    Historical Provider, MD  Multiple Vitamin (MULTIVITAMIN) tablet Take 1 tablet by mouth daily.      Historical Provider, MD  niacin (NIASPAN) 750 MG CR tablet Take 2 tablets (1,500 mg total) by mouth at bedtime. Patient not taking: Reported on 05/19/2015 09/09/14   Burnis Medin, MD  Omega-3 Fatty Acids (FISH OIL PO) Take 2 tablets by mouth daily.    Historical Provider, MD  ondansetron (  ZOFRAN-ODT) 4 MG disintegrating tablet Take 1 tablet (4 mg total) by mouth every 8 (eight) hours as needed for nausea or vomiting. Patient not taking: Reported on 05/21/2015 05/19/15   Burnis Medin, MD  VITAMIN D, CHOLECALCIFEROL, PO Take by mouth.      Historical Provider, MD     Family History  Problem Relation Age of Onset  . Coronary artery disease Mother 66  . Diabetes Mother   . Cancer Father 6    pancreatic  . Cancer Sister     breast  . Hypertension      family hx  . Hypertension Sister   . Stroke Sister   . Colon cancer Neg Hx   . Rectal cancer Neg Hx   . Stomach  cancer Neg Hx     History   Social History  . Marital Status: Single    Spouse Name: N/A  . Number of Children: N/A  . 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 Smoking status: Never Smoker   . Smokeless tobacco: Not on file  . Alcohol Use: 0.0 oz/week    3-4 Glasses of wine per week  . Drug Use: No  . Sexual Activity: Not on file   Other Topics Concern  . 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    Non smoker    Exercises some   G2P2   Tobacco   Etoh: ocass   caffiene coffee in am    HH of 2    ECOG Status: 1 - Symptomatic but completely ambulatory  Review of Systems: A 12 point ROS discussed and pertinent positives are indicated in the HPI above.  All other systems are negative.  Review of Systems  Vital Signs: BP 100/61 mmHg  Pulse 126  Temp(Src) 98.3 F (36.8 C) (Oral)  Resp 14  Ht 5\' 6"  (1.676 m)  Wt 132 lb (59.875 kg)  BMI 21.32 kg/m2  SpO2 99%  Physical Exam  Atraumatic, normocephalic, mucous membranes moist and pink. Wearing glasses. No icterus or scleral injection. Neck with full range of motion. No adenopathy or rigidity. Lung excursion symmetric on inspiration and expiration. Well kempt, appearing healthy. Appropriate insight and affect, with appropriate questions. Moving all 4 extremities equally with no gross motor deficits or sensory deficits. Genitourinary deferred.  Skin intact without open wounds. No jaundice.   Mallampati Score:  2  Imaging: US Abdomen Complete  05/18/2015   CLINICAL DATA:  Nausea and vomiting, weight loss. Acute renal insufficiency.  EXAM: ULTRASOUND ABDOMEN COMPLETE  COMPARISON:  None.  FINDINGS: Gallbladder: Possible sludge and small gallstones are noted. Mild gallbladder wall thickening is noted measuring 6 mm. No pericholecystic fluid is  noted. No sonographic Murphy's sign is noted.  Common bile duct: Diameter: 9 mm which is mildly dilated.  Liver: 2.7 cm complex abnormality seen in left hepatic lobe concerning for possible neoplasm.  IVC: Visualized portion appears normal.  Pancreas: Visualized portion unremarkable.  Spleen: Size and appearance within normal limits.  Right Kidney: Length: 9.7 cm. Echogenicity within normal limits. Moderate hydronephrosis is noted.  Left Kidney: Length: 11.1 cm. Echogenicity within normal limits. Mild left hydronephrosis is noted.  Abdominal aorta: No aneurysm visualized.  Other findings: Minimal ascites is noted. Probable epigastric mass measuring 4.1 x 3.6 x 2.9  cm is noted.  IMPRESSION: Possible sludge in minimal cholelithiasis is noted with gallbladder wall thickening, but no pericholecystic fluid. Acute cholecystitis cannot be excluded, and HIDA scan may be performed for further evaluation.  2.7 cm complex abnormality seen in left hepatic lobe which may represent mass or neoplasm. Also noted is possible epigastric mass measuring 4.1 x 3.6 x 2.9 cm. CT scan of the abdomen and pelvis with intravenous contrast administration is recommended for further evaluation.  Minimal left hydronephrosis is noted with moderate right hydronephrosis. Further evaluation with CT scan is recommended.   Electronically Signed   By: Marijo Conception, M.D.   On: 05/18/2015 16:59   Ct Abdomen Pelvis W Contrast  05/19/2015   CLINICAL DATA:  Nausea and vomiting for 3 weeks. Abdominal mass. Acute renal failure. Liver mass and hydronephrosis seen on recent ultrasound.  EXAM: CT ABDOMEN AND PELVIS WITH CONTRAST  TECHNIQUE: Multidetector CT imaging of the abdomen and pelvis was performed using the standard protocol following bolus administration of intravenous contrast.  CONTRAST:  42mL OMNIPAQUE IOHEXOL 300 MG/ML  SOLN  COMPARISON:  None.  FINDINGS: Lower Chest: No acute findings.  Hepatobiliary: Poorly defined hypovascular mass is seen in  the lateral segment left hepatic lobe measuring 3.2 x 3.5 cm on image 15 of series 2.  Another low-attenuation lesion is seen in the left hepatic lobe at the falciform ligament measuring approximately 1.8 x 2.6 cm on image 20. This could be due to focal fatty infiltration although hepatic neoplasm cannot be excluded.  A 1 cm cyst is seen in the dome of the left hepatic lobe. Diffuse biliary ductal dilatation is demonstrated. No definite signs of acute cholecystitis.  Pancreas: An ill-defined hypovascular mass is seen which is centered in the body of the pancreas which measures 3.4 x 6.1 cm on image 22/series 2. Distal pancreatic atrophy noted. 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 There is no evidence of portal vein thrombus. There is no direct involvement of the celiac axis, superior mesenteric artery, or superior mesenteric vein.  Spleen: Within normal limits in size and appearance. Upper abdominal venous collaterals are seen likely due to splenic vein thrombosis.  Adrenals:  No masses identified.  Kidneys/Urinary Tract: No renal masses are identified. Moderate right hydroureteronephrosis is seen . There is a irregular soft tissue mass obstructing the pelvic portion of the right ureter, as described below. This mass is located in the right adnexal region adjacent to the right iliac vessels and measures approximately 3.4 x 2.8 cm on image 64/series 2. There is adjacent lymphadenopathy in the right lower quadrant mesentery.  Stomach/Bowel/Peritoneum: Distal small bowel obstruction is seen with transition point at site of mass in the right pelvis/adnexal region described below. Mild ascites seen in the perihepatic space and inferior pelvis.a  Vascular/Lymphatic: Mild lymphadenopathy in the porta hepatis portacaval space, measuring up to 15 mm. Right lower quadrant mesenteric lymphadenopathy is seen adjacent to the right adnexal mass,  with largest lymph node measuring 10 mm on image 62/series 2.  Reproductive: Uterus is unremarkable. There is an irregular soft tissue mass in the right adnexa adjacent to the right iliac vessels measuring approximately 4 x 5 cm in maximum diameter on image 49/series 602. This mass obstructs the right ureter and distal small bowel loops in this region.  Other:  None.  Musculoskeletal:  No suspicious bone lesions identified.  IMPRESSION: 6 cm hypovascular mass centered in  the pancreatic body, suspicious for primary pancreatic carcinoma. This mass shows encasement of the splenic artery and vein, with splenic vein thrombosis. Consider EUS for tissue diagnosis.  Diffuse biliary ductal dilatation, likely secondary to mild porta hepatis lymphadenopathy/metastatic disease.  3.5 cm hypovascular mass in the lateral segment of the left hepatic lobe, suspicious for liver metastasis. Indeterminate left hepatic lobe lesion adjacent to the falciform ligament could represent focal fatty infiltration, although hepatic metastasis cannot be excluded.  Right hydroureteronephrosis and distal small bowel obstruction due to irregular soft tissue mass in the right adnexa approximately 4 x 5 cm. Adjacent mesenteric lymphadenopathy also seen. Differential diagnosis includes metastatic disease, or primary malignancies such as ovarian carcinoma, appendiceal carcinoma, or carcinoid tumor.  Mild ascites.   Electronically Signed   By: Earle Gell M.D.   On: 05/19/2015 14:22   US Renal  05/18/2015   CLINICAL DATA:  Acute renal insufficiency. Nausea vomiting. Weight loss.  EXAM: RENAL / URINARY TRACT ULTRASOUND COMPLETE  COMPARISON:  None.  FINDINGS: Right Kidney:  Length: 9.7 cm. Moderate right hydronephrosis. Right kidney is slightly echogenic suggesting associated medical renal disease. No focal renal mass.  Left Kidney:  Length: 11.1 cm. Minimal caliectasis noted over the lower pole calices. Left kidney is slightly echogenic suggesting the  possibility associated chronic medical renal disease. No focal renal mass.  Bladder:  No bladder distention.  Mild ascites.  IMPRESSION: 1. Moderate right hydronephrosis. Mild caliectasis left lower renal pole. No bladder distention.  2. Kidneys are slightly echogenic suggesting a component of chronic medical renal disease.  3. Mild ascites. Reference is made to abdominal ultrasound report for discussion of abdominal masses.   Electronically Signed   By: Marcello Moores  Register   On: 05/18/2015 16:56    Labs:  CBC:  Recent Labs  05/14/15 1119  WBC 11.0*  HGB 13.2  HCT 39.0  PLT 350.0    COAGS: No results for input(s): INR, APTT in the last 8760 hours.  BMP:  Recent Labs  09/03/14 0815 12/23/14 1001 05/14/15 1119 05/18/15 0838  NA 141 136 129* 131*  K 4.1 3.9 3.3* 4.3  CL 106 102 95* 98  CO2 28 28 23 25   GLUCOSE 130* 165* 206* 150*  BUN 11 13 30* 16  CALCIUM 9.6 9.5 9.2 8.7  CREATININE 0.8 0.76 1.71* 1.13    LIVER FUNCTION TESTS:  Recent Labs  09/03/14 0815 12/23/14 1001 05/14/15 1119  BILITOT 0.7 0.5 0.9  AST 53* 23 108*  ALT 50* 24 74*  ALKPHOS 66 61 68  PROT 7.2 6.8 6.2  ALBUMIN 3.7 3.8 3.5    TUMOR MARKERS: No results for input(s): AFPTM, CEA, CA199, CHROMGRNA in the last 8760 hours.  Assessment and Plan:  Ashley. Hurley is a very pleasant 64 year old female presenting today for evaluation of a percutaneous biopsy of a newly discovered left liver lesion, with associated pancreatic lesion on CT.  Biopsy will be planned for Monday, June 20 at [facility] as an outpatient procedure.   I am planning on presenting her case at the forthcoming multidisciplinary gastrointestinal tumor Board at [facility], to facilitate oncologic and potentially surgical oncology opinions.  I had a full informed consent with her and her family, and answered all their questions to the best of my ability. She understands and agrees with her plan of care.  Thank you  for this interesting consult.  I greatly enjoyed meeting Ashley Hurley and look forward to participating in her care.  SignedCorrie Mckusick 05/21/2015, 11:44 AM   I spent a total of  45 Minutes   in face to face in clinical consultation, greater than 50% of which was counseling/coordinating care for new diagnosis of liver mass, pancreatic mass, percutaneous image guided biopsy.

## 2015-05-22 ENCOUNTER — Telehealth: Payer: Self-pay | Admitting: Internal Medicine

## 2015-05-22 ENCOUNTER — Inpatient Hospital Stay (HOSPITAL_COMMUNITY): Payer: BC Managed Care – PPO

## 2015-05-22 ENCOUNTER — Other Ambulatory Visit: Payer: Self-pay | Admitting: Radiology

## 2015-05-22 ENCOUNTER — Inpatient Hospital Stay (HOSPITAL_COMMUNITY)
Admission: EM | Admit: 2015-05-22 | Discharge: 2015-06-08 | DRG: 981 | Disposition: A | Discharge status: Home Health | Payer: BC Managed Care – PPO | Attending: Internal Medicine | Admitting: Internal Medicine

## 2015-05-22 ENCOUNTER — Encounter (HOSPITAL_COMMUNITY): Payer: Self-pay | Admitting: Family Medicine

## 2015-05-22 DIAGNOSIS — L7621 Postprocedural hemorrhage and hematoma of skin and subcutaneous tissue following a dermatologic procedure: Secondary | ICD-10-CM | POA: Diagnosis not present

## 2015-05-22 DIAGNOSIS — N1330 Unspecified hydronephrosis: Secondary | ICD-10-CM | POA: Diagnosis present

## 2015-05-22 DIAGNOSIS — Z853 Personal history of malignant neoplasm of breast: Secondary | ICD-10-CM | POA: Diagnosis not present

## 2015-05-22 DIAGNOSIS — R112 Nausea with vomiting, unspecified: Secondary | ICD-10-CM | POA: Diagnosis not present

## 2015-05-22 DIAGNOSIS — E8809 Other disorders of plasma-protein metabolism, not elsewhere classified: Secondary | ICD-10-CM | POA: Diagnosis not present

## 2015-05-22 DIAGNOSIS — J9811 Atelectasis: Secondary | ICD-10-CM | POA: Diagnosis not present

## 2015-05-22 DIAGNOSIS — F419 Anxiety disorder, unspecified: Secondary | ICD-10-CM | POA: Diagnosis present

## 2015-05-22 DIAGNOSIS — J90 Pleural effusion, not elsewhere classified: Secondary | ICD-10-CM | POA: Diagnosis not present

## 2015-05-22 DIAGNOSIS — E8770 Fluid overload, unspecified: Secondary | ICD-10-CM | POA: Diagnosis not present

## 2015-05-22 DIAGNOSIS — L03311 Cellulitis of abdominal wall: Secondary | ICD-10-CM | POA: Diagnosis not present

## 2015-05-22 DIAGNOSIS — Z6820 Body mass index (BMI) 20.0-20.9, adult: Secondary | ICD-10-CM | POA: Diagnosis not present

## 2015-05-22 DIAGNOSIS — C251 Malignant neoplasm of body of pancreas: Secondary | ICD-10-CM | POA: Diagnosis present

## 2015-05-22 DIAGNOSIS — J9601 Acute respiratory failure with hypoxia: Secondary | ICD-10-CM | POA: Diagnosis not present

## 2015-05-22 DIAGNOSIS — Z923 Personal history of irradiation: Secondary | ICD-10-CM

## 2015-05-22 DIAGNOSIS — Z79899 Other long term (current) drug therapy: Secondary | ICD-10-CM

## 2015-05-22 DIAGNOSIS — J811 Chronic pulmonary edema: Secondary | ICD-10-CM | POA: Insufficient documentation

## 2015-05-22 DIAGNOSIS — R1110 Vomiting, unspecified: Secondary | ICD-10-CM | POA: Diagnosis present

## 2015-05-22 DIAGNOSIS — E861 Hypovolemia: Secondary | ICD-10-CM | POA: Diagnosis not present

## 2015-05-22 DIAGNOSIS — I10 Essential (primary) hypertension: Secondary | ICD-10-CM | POA: Diagnosis present

## 2015-05-22 DIAGNOSIS — D630 Anemia in neoplastic disease: Secondary | ICD-10-CM | POA: Diagnosis not present

## 2015-05-22 DIAGNOSIS — Y833 Surgical operation with formation of external stoma as the cause of abnormal reaction of the patient, or of later complication, without mention of misadventure at the time of the procedure: Secondary | ICD-10-CM | POA: Diagnosis not present

## 2015-05-22 DIAGNOSIS — E860 Dehydration: Secondary | ICD-10-CM | POA: Diagnosis not present

## 2015-05-22 DIAGNOSIS — R109 Unspecified abdominal pain: Secondary | ICD-10-CM | POA: Diagnosis not present

## 2015-05-22 DIAGNOSIS — D649 Anemia, unspecified: Secondary | ICD-10-CM | POA: Diagnosis not present

## 2015-05-22 DIAGNOSIS — I959 Hypotension, unspecified: Secondary | ICD-10-CM | POA: Insufficient documentation

## 2015-05-22 DIAGNOSIS — T8131XD Disruption of external operation (surgical) wound, not elsewhere classified, subsequent encounter: Secondary | ICD-10-CM | POA: Diagnosis not present

## 2015-05-22 DIAGNOSIS — T792XXD Traumatic secondary and recurrent hemorrhage and seroma, subsequent encounter: Secondary | ICD-10-CM | POA: Diagnosis not present

## 2015-05-22 DIAGNOSIS — M7989 Other specified soft tissue disorders: Secondary | ICD-10-CM | POA: Diagnosis not present

## 2015-05-22 DIAGNOSIS — K219 Gastro-esophageal reflux disease without esophagitis: Secondary | ICD-10-CM | POA: Diagnosis not present

## 2015-05-22 DIAGNOSIS — E785 Hyperlipidemia, unspecified: Secondary | ICD-10-CM | POA: Diagnosis present

## 2015-05-22 DIAGNOSIS — E870 Hyperosmolality and hypernatremia: Secondary | ICD-10-CM | POA: Diagnosis not present

## 2015-05-22 DIAGNOSIS — N134 Hydroureter: Secondary | ICD-10-CM | POA: Diagnosis not present

## 2015-05-22 DIAGNOSIS — E43 Unspecified severe protein-calorie malnutrition: Secondary | ICD-10-CM | POA: Diagnosis present

## 2015-05-22 DIAGNOSIS — K9423 Gastrostomy malfunction: Secondary | ICD-10-CM | POA: Diagnosis not present

## 2015-05-22 DIAGNOSIS — R1013 Epigastric pain: Secondary | ICD-10-CM | POA: Diagnosis not present

## 2015-05-22 DIAGNOSIS — Z9221 Personal history of antineoplastic chemotherapy: Secondary | ICD-10-CM | POA: Diagnosis not present

## 2015-05-22 DIAGNOSIS — R160 Hepatomegaly, not elsewhere classified: Secondary | ICD-10-CM | POA: Diagnosis not present

## 2015-05-22 DIAGNOSIS — K869 Disease of pancreas, unspecified: Secondary | ICD-10-CM | POA: Diagnosis not present

## 2015-05-22 DIAGNOSIS — I471 Supraventricular tachycardia: Secondary | ICD-10-CM | POA: Diagnosis present

## 2015-05-22 DIAGNOSIS — E876 Hypokalemia: Secondary | ICD-10-CM | POA: Diagnosis not present

## 2015-05-22 DIAGNOSIS — K5669 Other intestinal obstruction: Secondary | ICD-10-CM | POA: Diagnosis not present

## 2015-05-22 DIAGNOSIS — E2740 Unspecified adrenocortical insufficiency: Secondary | ICD-10-CM | POA: Diagnosis not present

## 2015-05-22 DIAGNOSIS — E871 Hypo-osmolality and hyponatremia: Secondary | ICD-10-CM | POA: Diagnosis present

## 2015-05-22 DIAGNOSIS — K5660 Unspecified intestinal obstruction: Secondary | ICD-10-CM | POA: Diagnosis present

## 2015-05-22 DIAGNOSIS — K8689 Other specified diseases of pancreas: Secondary | ICD-10-CM | POA: Diagnosis present

## 2015-05-22 DIAGNOSIS — B9689 Other specified bacterial agents as the cause of diseases classified elsewhere: Secondary | ICD-10-CM | POA: Diagnosis not present

## 2015-05-22 DIAGNOSIS — R739 Hyperglycemia, unspecified: Secondary | ICD-10-CM | POA: Diagnosis present

## 2015-05-22 DIAGNOSIS — T814XXA Infection following a procedure, initial encounter: Secondary | ICD-10-CM | POA: Diagnosis not present

## 2015-05-22 DIAGNOSIS — N179 Acute kidney failure, unspecified: Secondary | ICD-10-CM | POA: Diagnosis present

## 2015-05-22 DIAGNOSIS — C259 Malignant neoplasm of pancreas, unspecified: Secondary | ICD-10-CM | POA: Diagnosis not present

## 2015-05-22 DIAGNOSIS — I9581 Postprocedural hypotension: Secondary | ICD-10-CM | POA: Diagnosis not present

## 2015-05-22 DIAGNOSIS — Z9011 Acquired absence of right breast and nipple: Secondary | ICD-10-CM | POA: Diagnosis present

## 2015-05-22 DIAGNOSIS — R1084 Generalized abdominal pain: Secondary | ICD-10-CM | POA: Diagnosis not present

## 2015-05-22 DIAGNOSIS — R52 Pain, unspecified: Secondary | ICD-10-CM

## 2015-05-22 DIAGNOSIS — K567 Ileus, unspecified: Secondary | ICD-10-CM | POA: Diagnosis not present

## 2015-05-22 DIAGNOSIS — K56609 Unspecified intestinal obstruction, unspecified as to partial versus complete obstruction: Secondary | ICD-10-CM | POA: Diagnosis present

## 2015-05-22 DIAGNOSIS — C787 Secondary malignant neoplasm of liver and intrahepatic bile duct: Secondary | ICD-10-CM | POA: Diagnosis present

## 2015-05-22 DIAGNOSIS — T814XXD Infection following a procedure, subsequent encounter: Secondary | ICD-10-CM | POA: Diagnosis not present

## 2015-05-22 DIAGNOSIS — T8130XA Disruption of wound, unspecified, initial encounter: Secondary | ICD-10-CM | POA: Diagnosis not present

## 2015-05-22 LAB — COMPREHENSIVE METABOLIC PANEL
ALBUMIN: 3 g/dL — AB (ref 3.5–5.0)
ALK PHOS: 92 U/L (ref 38–126)
ALT: 27 U/L (ref 14–54)
AST: 22 U/L (ref 15–41)
Anion gap: 17 — ABNORMAL HIGH (ref 5–15)
BILIRUBIN TOTAL: 0.9 mg/dL (ref 0.3–1.2)
BUN: 18 mg/dL (ref 6–20)
CO2: 20 mmol/L — AB (ref 22–32)
CREATININE: 1.52 mg/dL — AB (ref 0.44–1.00)
Calcium: 8.6 mg/dL — ABNORMAL LOW (ref 8.9–10.3)
Chloride: 95 mmol/L — ABNORMAL LOW (ref 101–111)
GFR, EST AFRICAN AMERICAN: 40 mL/min — AB (ref >60)
GFR, EST NON AFRICAN AMERICAN: 35 mL/min — AB (ref >60)
Glucose, Bld: 168 mg/dL — ABNORMAL HIGH (ref 65–99)
POTASSIUM: 3.5 mmol/L (ref 3.5–5.1)
Sodium: 132 mmol/L — ABNORMAL LOW (ref 135–145)
Total Protein: 6.5 g/dL (ref 6.5–8.1)

## 2015-05-22 LAB — PREALBUMIN: PREALBUMIN: 9.2 mg/dL — AB (ref 18–38)

## 2015-05-22 LAB — URINALYSIS, ROUTINE W REFLEX MICROSCOPIC
Glucose, UA: NEGATIVE mg/dL
KETONES UR: 15 mg/dL — AB
Leukocytes, UA: NEGATIVE
Nitrite: NEGATIVE
PROTEIN: 30 mg/dL — AB
Specific Gravity, Urine: 1.015 (ref 1.005–1.030)
UROBILINOGEN UA: 0.2 mg/dL (ref 0.0–1.0)
pH: 6 (ref 5.0–8.0)

## 2015-05-22 LAB — CBC WITH DIFFERENTIAL/PLATELET
BASOS ABS: 0 10*3/uL (ref 0.0–0.1)
Basophils Relative: 0 % (ref 0–1)
Eosinophils Absolute: 0 10*3/uL (ref 0.0–0.7)
Eosinophils Relative: 0 % (ref 0–5)
HEMATOCRIT: 38 % (ref 36.0–46.0)
Hemoglobin: 13.2 g/dL (ref 12.0–15.0)
LYMPHS PCT: 13 % (ref 12–46)
Lymphs Abs: 1.1 10*3/uL (ref 0.7–4.0)
MCH: 32 pg (ref 26.0–34.0)
MCHC: 34.7 g/dL (ref 30.0–36.0)
MCV: 92.2 fL (ref 78.0–100.0)
MONO ABS: 1.4 10*3/uL — AB (ref 0.1–1.0)
Monocytes Relative: 17 % — ABNORMAL HIGH (ref 3–12)
NEUTROS ABS: 5.9 10*3/uL (ref 1.7–7.7)
Neutrophils Relative %: 70 % (ref 43–77)
PLATELETS: 254 10*3/uL (ref 150–400)
RBC: 4.12 MIL/uL (ref 3.87–5.11)
RDW: 14.7 % (ref 11.5–15.5)
WBC: 8.4 10*3/uL (ref 4.0–10.5)

## 2015-05-22 LAB — I-STAT CG4 LACTIC ACID, ED: Lactic Acid, Venous: 2.48 mmol/L (ref 0.5–2.0)

## 2015-05-22 LAB — LIPASE, BLOOD: Lipase: 57 U/L — ABNORMAL HIGH (ref 22–51)

## 2015-05-22 LAB — URINE MICROSCOPIC-ADD ON

## 2015-05-22 MED ORDER — ONDANSETRON HCL 4 MG/2ML IJ SOLN
4.0000 mg | Freq: Four times a day (QID) | INTRAMUSCULAR | Status: DC | PRN
  Administered 2015-05-22 – 2015-05-27 (×7): 4 mg via INTRAVENOUS
  Filled 2015-05-22 (×7): qty 2

## 2015-05-22 MED ORDER — ENOXAPARIN SODIUM 40 MG/0.4ML ~~LOC~~ SOLN
40.0000 mg | SUBCUTANEOUS | Status: AC
  Administered 2015-05-22 – 2015-05-23 (×2): 40 mg via SUBCUTANEOUS
  Filled 2015-05-22 (×2): qty 0.4

## 2015-05-22 MED ORDER — FENTANYL CITRATE (PF) 100 MCG/2ML IJ SOLN
25.0000 ug | Freq: Once | INTRAMUSCULAR | Status: AC
  Administered 2015-05-22: 25 ug via INTRAVENOUS
  Filled 2015-05-22: qty 2

## 2015-05-22 MED ORDER — HYDROMORPHONE HCL 1 MG/ML IJ SOLN
1.0000 mg | INTRAMUSCULAR | Status: DC | PRN
  Administered 2015-05-22 – 2015-06-03 (×42): 1 mg via INTRAVENOUS
  Filled 2015-05-22 (×46): qty 1

## 2015-05-22 MED ORDER — PROMETHAZINE HCL 25 MG/ML IJ SOLN
12.5000 mg | Freq: Four times a day (QID) | INTRAMUSCULAR | Status: DC | PRN
  Administered 2015-05-22 – 2015-05-27 (×4): 12.5 mg via INTRAVENOUS
  Filled 2015-05-22 (×4): qty 1

## 2015-05-22 MED ORDER — LIDOCAINE VISCOUS 2 % MT SOLN
OROMUCOSAL | Status: AC
  Filled 2015-05-22: qty 15

## 2015-05-22 MED ORDER — SODIUM CHLORIDE 0.9 % IV BOLUS (SEPSIS)
2000.0000 mL | Freq: Once | INTRAVENOUS | Status: AC
  Administered 2015-05-22: 2000 mL via INTRAVENOUS

## 2015-05-22 MED ORDER — PROMETHAZINE HCL 25 MG/ML IJ SOLN
12.5000 mg | Freq: Once | INTRAMUSCULAR | Status: AC
  Administered 2015-05-22: 12.5 mg via INTRAVENOUS
  Filled 2015-05-22 (×2): qty 1

## 2015-05-22 MED ORDER — ONDANSETRON HCL 4 MG/2ML IJ SOLN
4.0000 mg | Freq: Once | INTRAMUSCULAR | Status: AC
  Administered 2015-05-22: 4 mg via INTRAVENOUS
  Filled 2015-05-22: qty 2

## 2015-05-22 MED ORDER — PROMETHAZINE HCL 25 MG/ML IJ SOLN: 25.0000 mg | Freq: Once | INTRAMUSCULAR | Status: DC

## 2015-05-22 MED ORDER — ACETAMINOPHEN 325 MG PO TABS
650.0000 mg | ORAL_TABLET | Freq: Four times a day (QID) | ORAL | Status: DC | PRN
  Administered 2015-05-30 – 2015-06-07 (×3): 650 mg via ORAL
  Filled 2015-05-22 (×3): qty 2

## 2015-05-22 MED ORDER — SODIUM CHLORIDE 0.9 % IV SOLN
1000.0000 mL | Freq: Once | INTRAVENOUS | Status: AC
  Administered 2015-05-22: 1000 mL via INTRAVENOUS

## 2015-05-22 MED ORDER — ACETAMINOPHEN 650 MG RE SUPP: 650.0000 mg | Freq: Four times a day (QID) | RECTAL | Status: DC | PRN

## 2015-05-22 MED ORDER — PANTOPRAZOLE SODIUM 40 MG IV SOLR
40.0000 mg | INTRAVENOUS | Status: DC
  Administered 2015-05-22 – 2015-06-01 (×10): 40 mg via INTRAVENOUS
  Filled 2015-05-22 (×13): qty 40

## 2015-05-22 MED ORDER — SODIUM CHLORIDE 0.9 % IV SOLN
INTRAVENOUS | Status: DC
  Administered 2015-05-22 – 2015-05-26 (×6): via INTRAVENOUS

## 2015-05-22 NOTE — ED Notes (Signed)
Pt placed on bedpan to urinate; urine specimen collected and being sent to lab for testing; pt resting now; visitors at bedside

## 2015-05-22 NOTE — Telephone Encounter (Signed)
[facility] [facility] - Client [facility]  Patient Name: Ashley Hurley  Gender: Female  DOB: [birth year]   Age: 64 Y 20 D  Return Phone Number: [phone] (Primary)  Address:   City/State/Zip: [place]    Client [place] [facility] - Client  Client Site [place] - Day  Physician Pan[place], [place] Type Call  Call Type Triage / Clinical  Caller Name Ledora Bottcher  Relationship To Patient Partner  Appointment Disposition EMR Appointment Scheduled  Info pasted into Epic Yes  Return Phone Number [phone] (Primary)  Chief Complaint Abdominal Pain  Initial Comment caller states patient has cramping abdominal pain  [place] Not Listed [place]   PreDisposition Call Doctor   Nurse Assessment  Nurse: Buck Mam, RN, Trish Date/Time Eilene Ghazi Time): 05/22/2015 9:10:53 AM  Confirm and document reason for call. If symptomatic, describe symptoms. ---caller states patient has cramping abdominal pain last week was dx with pancreatic cancer. Was seen last week and was not having pain. Patient started to have cramping and now is vomiting and having pain uncontrolled by medication. When she vomits feels better but then comes back. NO blood in vomit. Has not been eating she has been drinking smoothies. Dr. Zack Seal is coordinating all of this now. BX scheduled on Monday.  Has the patient traveled out of the country within the last 30 days? ---No  Does the patient require triage? ---Yes  Related visit to physician within the last 2 weeks? ---Yes   Was seen Monday  Does the PT have any chronic conditions? (i.e. diabetes, asthma, etc.) ---Yes  List chronic conditions. ---Pancreatic cancer hypertension.     Guidelines      Guideline Title Affirmed Question Affirmed Notes Nurse Date/Time Eilene Ghazi Time)  Abdominal Pain - Upper [1] MILD-MODERATE constant pain AND [2] present > 2 hours AND [3]  not relieved by antacids  Buck Mam, RN, Trish 05/22/2015 9:16:48 AM   Disp. Time Eilene Ghazi Time) Disposition Final User   05/22/2015 8:58:48 AM Send To Clinical Follow Up Queue  [place] [facility]    05/22/2015 9:18:20 AM See Physician within 4 Hours (or PCP triage) Yes Buck Mam, RN, Particia Lather Understands: Yes  Disagree/Comply: Comply     Care Advice Given Per Guideline      CALL BACK IF: * You become worse. SEE PHYSICIAN WITHIN 4 HOURS (or PCP triage): CARE ADVICE given per Abdominal Pain, Upper (Adult) guideline.   After Care Instructions Given     Call Event Type User Date / Time Description        Referrals

## 2015-05-22 NOTE — Progress Notes (Signed)
Utilization review completed.  

## 2015-05-22 NOTE — ED Provider Notes (Signed)
Patient is a 64 year old female, she has a breast cancer survivor, she has not had any treatment in many many years, she last saw her oncologist 5 years ago, she presents after having approximately one to one and a half weeks of persistent vomiting. This varies in amount date today, today it has been perfectly severe with multiple episodes of vomiting, she has not had a stool in several days, she has not had anything to eat today. She feels generally weak and on exam has tenderness in the bilateral lower abdomen with a tachycardia 120 and hypotension at 85 systolic. There is no peripheral edema, lung sounds are normal, mucous members are dry, she appears awake alert and is following commands without any difficulties. She will need a metabolic workup as well as infectious workup, anticipate a CT scan of the lower abdomen to sure there is no kind of metastatic disease or other abdominal process that could be driving her nausea and vomiting and abdominal discomfort. The patient is in agreement with the plan. She currently is hypotensive and will need fluid resuscitation.  CT scan shows pancreatic CA, she had this done several days ago - no indication for repeat scan, pt receiving rehydration, will need admission.  Medical screening examination/treatment/procedure(s) were conducted as a shared visit with non-physician practitioner(s) and myself.  I personally evaluated the patient during the encounter.  Clinical Impression:   Final diagnoses:  Pain  Abdominal pain  SBO (small bowel obstruction)  Pancreatic mass  BREAST CANCER, HX OF  Hydroureter, right         Noemi Chapel, MD 05/23/15 0730

## 2015-05-22 NOTE — Telephone Encounter (Signed)
[place] [facility] - Client [place] [facility]  Patient Name: Ashley Hurley  Gender: Female  DOB: [birth year]   Age: 64 Y 20 D  Return Phone Number: [phone] (Primary)  Address:   City/State/Zip: [place]    Client [facility] - Client  Client Site [place] - Day  Physician Panosh, [place] Type Call  Call Type Triage / Clinical  Caller Name Ledora Bottcher  Relationship To Patient Partner  Appointment Disposition EMR Appointment Attempted - Not Scheduled  Info pasted into Epic Yes  Return Phone Number [phone] (Primary)  Chief Complaint Abdominal Pain  Initial Comment caller states patient has cramping abdominal pain  [place] Not Listed [place]   PreDisposition Call Doctor   Nurse Assessment  Nurse: Buck Mam, RN, Trish Date/Time Eilene Ghazi Time): 05/22/2015 9:10:53 AM  Confirm and document reason for call. If symptomatic, describe symptoms. ---caller states patient has cramping abdominal pain last week was dx with pancreatic cancer. Was seen last week and was not having pain. Patient started to have cramping and now is vomiting and having pain uncontrolled by medication. When she vomits feels better but then comes back. NO blood in vomit. Has not been eating she has been drinking smoothies. Dr. Zack Seal is coordinating all of this now. BX scheduled on Monday.  Has the patient traveled out of the country within the last 30 days? ---No  Does the patient require triage? ---Yes  Related visit to physician within the last 2 weeks? ---Yes   Was seen Monday  Does the PT have any chronic conditions? (i.e. diabetes, asthma, etc.) ---Yes  List chronic conditions. ---Pancreatic cancer hypertension.     Guidelines      Guideline Title Affirmed Question Affirmed Notes Nurse Date/Time Eilene Ghazi Time)  Abdominal Pain - Upper [1] MILD-MODERATE constant pain AND [2] present > 2  hours AND [3] not relieved by antacids  Buck Mam, RN, Trish 05/22/2015 9:16:48 AM   Disp. Time Eilene Ghazi Time) Disposition Final User   05/22/2015 8:58:48 AM Send To Clinical Follow Up Queue  [facility] [facility]    05/22/2015 9:18:20 AM See Physician within 4 Hours (or PCP triage) Yes Buck Mam, RN, Particia Lather Understands: Yes  Disagree/Comply: Comply     Care Advice Given Per Guideline      CALL BACK IF: * You become worse. SEE PHYSICIAN WITHIN 4 HOURS (or PCP triage): CARE ADVICE given per Abdominal Pain, Upper (Adult) guideline.   After Care Instructions Given     Call Event Type User Date / Time Description        Comments  User: Rubie Maid, RN Date/Time Eilene Ghazi Time): 05/22/2015 11:56:21 AM  Spoke to office and updated on patients condition. Patient is now c/o uncontrolled left side pain that is spreading all across her abdomen. The pain is not constant but is very painful and is not controlled by narcotic pain medication. Patient is also having frequent vomiting episodes. Patient advised to go to ER to be seen and states will go to [facility].   Referrals  REFERRED TO PCP OFFICE  [facility] - ED  Will be seen in ER at [facility]

## 2015-05-22 NOTE — Progress Notes (Signed)
NURSING PROGRESS NOTE  Ashley Hurley [id number] Admission Data: 05/22/2015 7:57 PM Attending Provider: Domenic Polite, MD MEB:RAXENM,MHWKG KOTVAN, MD Code Status: Full  Ashley Hurley is a 64 y.o. female patient admitted from ED:  -No acute distress noted.  -No complaints of shortness of breath.  -No complaints of chest pain.   Cardiac Monitoring: Box # 09 in place. Cardiac monitor yields:normal sinus rhythm.  Blood pressure 109/65, pulse 88, temperature 98 F (36.7 C), temperature source Oral, resp. rate 16, height 5\' 6"  (1.676 m), weight 58.968 kg (130 lb), SpO2 99 %.   IV Fluids:  IV in place, occlusive dsg intact without redness, IV cath forearm right, condition patent and no redness normal saline.   Allergies:  Aspirin  Past Medical History:   has a past medical history of Cancer; Hypertension; Hyperlipidemia; Allergy; Chocolate cyst of ovary; and Breast cancer, right breast (Jan 2001).  Past Surgical History:   has past surgical history that includes Breast surgery and Oophorectomy.  Social History:   reports that she has never smoked. She does not have any smokeless tobacco history on file. She reports that she drinks alcohol. She reports that she does not use illicit drugs.  Skin: Intact  Patient/Family orientated to room. Information packet given to patient/family. Admission inpatient armband information verified with patient/family to include name and date of birth and placed on patient arm. Side rails up x 2, fall assessment and education completed with patient/family. Patient/family able to verbalize understanding of risk associated with falls and verbalized understanding to call for assistance before getting out of bed. Call light within reach. Patient/family able to voice and demonstrate understanding of unit orientation instructions.    Will continue to evaluate and treat per MD orders.

## 2015-05-22 NOTE — Progress Notes (Signed)
Attempted to insert NG tube at least three times.  Pt did not tolerate well.  Paged Dr. Broadus John.  Awaiting new orders.  Will continue to monitor.

## 2015-05-22 NOTE — Telephone Encounter (Signed)
Pt's condition has changed, she is in a lot of pain and vomiting often. They would like to know if you can give medicine or what they should be doing. [name] would like Ashley Hurley or Dr Regis Bill to call. Also advised/asked rose marie to speak w/ triage. transferred

## 2015-05-22 NOTE — Telephone Encounter (Signed)
Printed note and given to [facility]

## 2015-05-22 NOTE — ED Provider Notes (Signed)
CSN: [id number]     Arrival date & time 05/22/15  1056 History   First MD Initiated Contact with Patient 05/22/15 1134     Chief Complaint  Patient presents with  . Emesis  . Abdominal Pain     (Consider location/radiation/quality/duration/timing/severity/associated sxs/prior Treatment) HPI    Blood pressure 104/64, pulse 82, temperature 97.9 F (36.6 C), temperature source Oral, resp. rate 16, height 5\' 6"  (1.676 m), weight 130 lb (58.968 kg), SpO2 99 %.  Ashley Hurley is a 64 y.o. female complaining of episodes of nausea and vomiting and generalized fatigue which she's had for 2 weeks worsening over the course of last 48 hours. Patient was seen by her primary care, sent for CT abdomen pelvis which showed a pancreatic head mass and lesions in the liver concerning for metastatic disease, they also noted a right adnexal mass which was causing a small bowel obstruction and hydroureter. Patient has not seen an oncologist, she has a biopsy scheduled. She reports she has not had a bowel movement in 2 days, is not really passing gas today. She describes the emesis as bilious. Denies fever, chills, syncope, chest pain, shortness of breath.  Past Medical History  Diagnosis Date  . Cancer     breast  . Hypertension   . Hyperlipidemia   . Allergy   . Chocolate cyst of ovary   . Breast cancer, right breast Jan 2001    IIBT3N0 radiation and chemo [facility]   Past Surgical History  Procedure Laterality Date  . Breast surgery      rt mastectomy  . Oophorectomy     Family History  Problem Relation Age of Onset  . Coronary artery disease Mother 41  . Diabetes Mother   . Cancer Father 39    pancreatic  . Cancer Sister     breast  . Hypertension      family hx  . Hypertension Sister   . Stroke Sister   . Colon cancer Neg Hx   . Rectal cancer Neg Hx   . Stomach cancer Neg Hx    History  Substance Use Topics  . Smoking status: Never Smoker   . Smokeless tobacco: Not on file  .  Alcohol Use: 0.0 oz/week    3-4 Glasses of wine per week   OB History    No data available     Review of Systems  10 systems reviewed and found to be negative, except as noted in the HPI.   Allergies  Aspirin  Home Medications   Prior to Admission medications   Medication Sig Start Date End Date Taking? Authorizing Provider  escitalopram (LEXAPRO) 20 MG tablet TAKE 1 TABLET EVERY DAY 09/09/14  Yes Burnis Medin, MD  omeprazole (PRILOSEC) 40 MG capsule Take 1 capsule (40 mg total) by mouth daily. 05/01/15  Yes Laurey Morale, MD  oxyCODONE-acetaminophen (PERCOCET/ROXICET) 5-325 MG per tablet Take 1 tablet by mouth 2 (two) times daily.   Yes Historical Provider, MD  lisinopril-hydrochlorothiazide (PRINZIDE,ZESTORETIC) 10-12.5 MG per tablet TAKE 1 TABLET BY MOUTH EVERY DAY Patient not taking: Reported on 05/21/2015 09/09/14   Burnis Medin, MD  niacin (NIASPAN) 750 MG CR tablet Take 2 tablets (1,500 mg total) by mouth at bedtime. Patient not taking: Reported on 05/19/2015 09/09/14   Burnis Medin, MD   BP 90/76 mmHg  Pulse 73  Temp(Src) 97.9 F (36.6 C) (Oral)  Resp 16  Ht 5\' 6"  (1.676 m)  Wt 130 lb (58.968 kg)  BMI 20.99 kg/m2  SpO2 99% Physical Exam  Constitutional: She is oriented to person, place, and time. She appears well-developed and well-nourished. No distress.  HENT:  Head: Normocephalic.  Dry MM  Eyes: Conjunctivae and EOM are normal. Pupils are equal, round, and reactive to light.  Neck: Normal range of motion. Neck supple.  Cardiovascular: Regular rhythm.   Mild tachycardia  Pulmonary/Chest: Effort normal and breath sounds normal. No stridor. No respiratory distress. She has no wheezes. She has no rales. She exhibits no tenderness.  Abdominal: Soft. Bowel sounds are normal. She exhibits no distension and no mass. There is tenderness. There is no rebound and no guarding.  Mild diffuse TTP in the b/l lower quadrants, No guarding rebound  Musculoskeletal: Normal  range of motion.  Neurological: She is alert and oriented to person, place, and time.  Skin:  Erythematous lesion in the suprapubic area  Psychiatric: She has a normal mood and affect.  Nursing note and vitals reviewed.   ED Course  Procedures (including critical care time) Labs Review Labs Reviewed  COMPREHENSIVE METABOLIC PANEL  CBC WITH DIFFERENTIAL/PLATELET  LIPASE, BLOOD  URINALYSIS, DIPSTICK ONLY  I-STAT CG4 LACTIC ACID, ED    Imaging Review No results found.   EKG Interpretation None      MDM   Final diagnoses:  Pain  Abdominal pain  SBO (small bowel obstruction)  Pancreatic mass  BREAST CANCER, HX OF  Hydroureter, right    Filed Vitals:   05/22/15 1101 05/22/15 1130 05/22/15 1215  BP: 93/64 90/76 100/71  Pulse: 147 73 87  Temp: 97.9 F (36.6 C)    TempSrc: Oral    Resp: 18 16 16   Height: 5\' 6"  (1.676 m)    Weight: 130 lb (58.968 kg)    SpO2: 97% 99% 100%    Medications  0.9 %  sodium chloride infusion ( Intravenous New Bag/Given 05/22/15 1401)  promethazine (PHENERGAN) injection 12.5 mg (not administered)  HYDROmorphone (DILAUDID) injection 1 mg (not administered)  ondansetron (ZOFRAN) injection 4 mg (not administered)  0.9 %  sodium chloride infusion (1,000 mLs Intravenous New Bag/Given 05/22/15 1208)  sodium chloride 0.9 % bolus 2,000 mL (2,000 mLs Intravenous New Bag/Given 05/22/15 1155)  ondansetron (ZOFRAN) injection 4 mg (4 mg Intravenous Given 05/22/15 1208)  fentaNYL (SUBLIMAZE) injection 25 mcg (25 mcg Intravenous Given 05/22/15 1401)    Ashley Hurley is a pleasant 64 y.o. female presenting with multiple episodes of bilious emesis worsening over the course of 2 weeks, significantly worsening in the last 48 hours. Patient has generalized weakness, she is tachycardic and hypotensive. Mentating appropriately. Clinically dehydrated. Patient had recent CT scan showing what is likely pancreatic cancer with liver and adnexal masses, the adnexal  mass is causing hydroureter and a small bowel obstruction. Patient has planned outpatient biopsy, has not followed with oncology. Will need admission for SBO.  D/w PA Will, CCS will eval requests hospitalist admission. Case d/w [facility] hospitalist Dr. Broadus John who accepts admission to a telemetry bed.  This is a shared visit with the attending physician who personally evaluated the patient and agrees with the care plan.       Monico Blitz, PA-C 05/22/15 1418  Noemi Chapel, MD 05/23/15 0730

## 2015-05-22 NOTE — Telephone Encounter (Signed)
Pt seen in ED 

## 2015-05-22 NOTE — H&P (Signed)
[facility] History and Physical  Tannis Burstein VFI:[id number] DOB: 1951/05/31 DOA: 05/22/2015  Referring physician: EDP PCP: Lottie Dawson, MD   Chief Complaint: Vomiting  HPI: Ashley Hurley is a 64 y.o. female  with history of hypertension, right breast CA status post mastectomy present to the ER with nausea, vomiting, abdominal pain. Patient reports that she's been vomiting off and on for 2 weeks now this worsened significantly in the last 2 days in addition has diffuse crampy abdominal pain primarily in the upper abdomen. In addition she's had weight loss about 10-15 pounds in the last month. She has been seeing her primary care doctor for this and had a CT scan on 6/14 which showed pancreatic mass, liver mass, lymphadenopathy, SBO, hydronephrosis. Presents to the ER today due to worsening of vomiting, her last bowel movement was 2 days ago. In the ER labs remarkable for mild hyponatremia, mild a GI creatinine of 1.5, lactic acid of 2.4, KUB pending  Review of Systems: Positives bolded  Constitutional  weight loss, night sweats, Fevers, chills, fatigue.  HEENT:  No headaches, Difficulty swallowing,Tooth/dental problems,Sore throat,  No sneezing, itching, ear ache, nasal congestion, post nasal drip,  Cardio-vascular:  No chest pain, Orthopnea, PND, swelling in lower extremities, anasarca, dizziness, palpitations  GI:  No heartburn, indigestion, abdominal pain, nausea, vomiting, diarrhea, change in bowel habits, loss of appetite  Resp:  No shortness of breath with exertion or at rest. No excess mucus, no productive cough, No non-productive cough, No coughing up of blood.No change in color of mucus.No wheezing.No chest wall deformity  Skin:  no rash or lesions.  GU:  no dysuria, change in color of urine, no urgency or frequency. No flank pain.  Musculoskeletal:  No joint pain or swelling. No decreased range of motion. No back pain.  Psych:  No change in mood or  affect. No depression or anxiety. No memory loss.   Past Medical History  Diagnosis Date  . Cancer     breast  . Hypertension   . Hyperlipidemia   . Allergy   . Chocolate cyst of ovary   . Breast cancer, right breast Jan 2001    IIBT3N0 radiation and chemo [facility]   Past Surgical History  Procedure Laterality Date  . Breast surgery      rt mastectomy  . Oophorectomy     Social History:  reports that she has never smoked. She does not have any smokeless tobacco history on file. She reports that she drinks alcohol. She reports that she does not use illicit drugs.  Allergies  Allergen Reactions  . Aspirin     Family History  Problem Relation Age of Onset  . Coronary artery disease Mother 35  . Diabetes Mother   . Cancer Father 25    pancreatic  . Cancer Sister     breast  . Hypertension      family hx  . Hypertension Sister   . Stroke Sister   . Colon cancer Neg Hx   . Rectal cancer Neg Hx   . Stomach cancer Neg Hx     Prior to Admission medications   Medication Sig Start Date End Date Taking? Authorizing Provider  escitalopram (LEXAPRO) 20 MG tablet TAKE 1 TABLET EVERY DAY 09/09/14  Yes Burnis Medin, MD  omeprazole (PRILOSEC) 40 MG capsule Take 1 capsule (40 mg total) by mouth daily. 05/01/15  Yes Laurey Morale, MD  oxyCODONE-acetaminophen (PERCOCET/ROXICET) 5-325 MG per tablet Take 1 tablet by mouth 2 (  two) times daily.   Yes Historical Provider, MD  lisinopril-hydrochlorothiazide (PRINZIDE,ZESTORETIC) 10-12.5 MG per tablet TAKE 1 TABLET BY MOUTH EVERY DAY Patient not taking: Reported on 05/21/2015 09/09/14   Burnis Medin, MD  niacin (NIASPAN) 750 MG CR tablet Take 2 tablets (1,500 mg total) by mouth at bedtime. Patient not taking: Reported on 05/19/2015 09/09/14   Burnis Medin, MD   Physical Exam: Filed Vitals:   05/22/15 1101 05/22/15 1130 05/22/15 1215 05/22/15 1315  BP: 93/64 90/76 100/71 104/64  Pulse: 147 73 87 82  Temp: 97.9 F (36.6 C)       TempSrc: Oral     Resp: 18 16 16 16   Height: 5\' 6"  (1.676 m)     Weight: 58.968 kg (130 lb)     SpO2: 97% 99% 100% 99%    Wt Readings from Last 3 Encounters:  05/22/15 58.968 kg (130 lb)  05/21/15 59.875 kg (132 lb)  05/19/15 59.784 kg (131 lb 12.8 oz)    General:  Appears calm and comfortable, no distress, AAO 3  Eyes: PERRL, normal lids, irises & conjunctiva ENT: grossly normal hearing, lips & tongue Neck: no LAD, masses or thyromegaly Cardiovascular: RRR, no m/r/g. No LE edema. Telemetry: SR, no arrhythmias  Respiratory: CTA bilaterally, no w/r/r. Normal respiratory effort. Abdomen: Soft, mildly distended, increased bowel sounds, nontender, hepatomegaly noted Skin: no rash or induration seen on limited exam Musculoskeletal: grossly normal tone BUE/BLE Psychiatric: grossly normal mood and affect, speech fluent and appropriate Neurologic: grossly non-focal.          Labs on Admission:  Basic Metabolic Panel:  Recent Labs Lab 05/18/15 0838 05/22/15 1153  NA 131* 132*  K 4.3 3.5  CL 98 95*  CO2 25 20*  GLUCOSE 150* 168*  BUN 16 18  CREATININE 1.13 1.52*  CALCIUM 8.7 8.6*   Liver Function Tests:  Recent Labs Lab 05/22/15 1153  AST 22  ALT 27  ALKPHOS 92  BILITOT 0.9  PROT 6.5  ALBUMIN 3.0*    Recent Labs Lab 05/22/15 1153  LIPASE 57*   No results for input(s): AMMONIA in the last 168 hours. CBC:  Recent Labs Lab 05/22/15 1153  WBC 8.4  NEUTROABS 5.9  HGB 13.2  HCT 38.0  MCV 92.2  PLT 254   Cardiac Enzymes: No results for input(s): CKTOTAL, CKMB, CKMBINDEX, TROPONINI in the last 168 hours.  BNP (last 3 results) No results for input(s): BNP in the last 8760 hours.  ProBNP (last 3 results) No results for input(s): PROBNP in the last 8760 hours.  CBG: No results for input(s): GLUCAP in the last 168 hours.  Radiological Exams on Admission: No results found.  EKG: Independently reviewed.Pending  Assessment/PlanBasic   Small bowel  obstruction  -Distal small bowel with transition point at site of mass in the right pelvis/adnexal -KUB today -Surgery consulting -Nothing by mouth, IV fluids, NG decompression, repeat films in a.m. -PPI, antiemetics  Suspected pancreatic cancer with liver metastasis Check CA-19-9 and CEA, IR consult for liver biopsy requested -Oncology consult once path available   Right hydronephrosis  -Suspect AKI primarily due to prerenal azotemia from volume depletion, vomiting if creatinine does improve with hydration are worsens will need urology consult to consider right ureteral stent   AKI: -due to vomiting/dehydration, ACE hydrate and monitor -possible that hydronephrosis also contributing -urology consult if creatinine doesn't improve  Hypertension -Blood pressure soft at this time monitor, hold ace  Code Status: Full COde DVT Prophylaxis: lovenox  Family Communication: daughter and partner at bedside Disposition Plan: inpatient  Time spent: 62min  Boots Mcglown [facility] Pager [phone]

## 2015-05-22 NOTE — ED Notes (Signed)
Pt transported to XRay 

## 2015-05-22 NOTE — ED Notes (Signed)
Pt given a warm blanket and visitors given 2 cups of water with no ice per request

## 2015-05-22 NOTE — Telephone Encounter (Signed)
Patient went to ED

## 2015-05-22 NOTE — ED Notes (Signed)
Pt here for votming x 2 weeks and getting progressively worse. Sent here by PCP. Pt tachy and hypotensive at triage.

## 2015-05-22 NOTE — ED Notes (Signed)
Pt given 2 more blankets; visitors still at bedside

## 2015-05-22 NOTE — Consult Note (Signed)
Reason for Consult: small bowel obstruction, pancreatic mass Referring Physician: Dr. Noemi Chapel PCP: Dr. Shanon Ace GI: Dr. Lucio Edward  HPI: Ashley Hurley is a 64 year old female with a history of HTN, partial hysterectomy, breast cancer s/p right  Mastectomy who presents with worsening nausea and vomiting.  Duration of symptoms is 2 months. Onset was gradual.  Coarse is worsening.  Time pattern is intermittent, worse after oral intake.  Characterized as crampy abdominal pain.  Location to upper abdomen.  Modifying factors include; heating pain.  Aggravated by oral intake.  no alleviating factors. Associated with early satiety, 15lb weight loss over the last 3-4 weeks.  She has been seen by per primary care doctor for ongoing symptoms and had a CT of A/P on 6/14 to follow up on abdominal and renal US.  CT of A/P a pancreatic body mass, splenic artery and vein thrombosis, left hepatic lobe mass, right adnexal mass and a small bowel obstruction.  She was seen by IR yesterday and scheduled for a biopsy on Monday, but she has not been able to keep anything down.  Last BM on Wednesday, no longer passing flatus.  Denies increase in abdominal pain.  Denies changes in bowel pattern, melena or hematochezia.  She reports having a colonoscopy 2 years ago by Dr. Fuller Plan which was normal.   Family history significant for father with pancreatic cancer and sister with breast cancer.  Denies colon or ovarian cancer.   Past abdominal surgeries; partial hysterectomy.   Past Medical History  Diagnosis Date  . Cancer     breast  . Hypertension   . Hyperlipidemia   . Allergy   . Chocolate cyst of ovary   . Breast cancer, right breast Jan 2001    IIBT3N0 radiation and chemo [facility]    Past Surgical History  Procedure Laterality Date  . Breast surgery      rt mastectomy  . Oophorectomy      Family History  Problem Relation Age of Onset  . Coronary artery disease Mother 40  . Diabetes Mother    . Cancer Father 41    pancreatic  . Cancer Sister     breast  . Hypertension      family hx  . Hypertension Sister   . Stroke Sister   . Colon cancer Neg Hx   . Rectal cancer Neg Hx   . Stomach cancer Neg Hx     Social History:  reports that she has never smoked. She does not have any smokeless tobacco history on file. She reports that she drinks alcohol. She reports that she does not use illicit drugs.  Allergies:  Allergies  Allergen Reactions  . Aspirin     Medications:  Prior to Admission medications   Medication Sig Start Date End Date Taking? Authorizing Provider  escitalopram (LEXAPRO) 20 MG tablet TAKE 1 TABLET EVERY DAY 09/09/14  Yes Burnis Medin, MD  omeprazole (PRILOSEC) 40 MG capsule Take 1 capsule (40 mg total) by mouth daily. 05/01/15  Yes Laurey Morale, MD  oxyCODONE-acetaminophen (PERCOCET/ROXICET) 5-325 MG per tablet Take 1 tablet by mouth 2 (two) times daily.   Yes Historical Provider, MD  lisinopril-hydrochlorothiazide (PRINZIDE,ZESTORETIC) 10-12.5 MG per tablet TAKE 1 TABLET BY MOUTH EVERY DAY Patient not taking: Reported on 05/21/2015 09/09/14   Burnis Medin, MD  niacin (NIASPAN) 750 MG CR tablet Take 2 tablets (1,500 mg total) by mouth at bedtime. Patient not taking: Reported on 05/19/2015 09/09/14  Burnis Medin, MD     Results for orders placed or performed during the hospital encounter of 05/22/15 (from the past 48 hour(s))  Comprehensive metabolic panel     Status: Abnormal   Collection Time: 05/22/15 11:53 AM  Result Value Ref Range   Sodium 132 (L) 135 - 145 mmol/L   Potassium 3.5 3.5 - 5.1 mmol/L   Chloride 95 (L) 101 - 111 mmol/L   CO2 20 (L) 22 - 32 mmol/L   Glucose, Bld 168 (H) 65 - 99 mg/dL   BUN 18 6 - 20 mg/dL   Creatinine, Ser 1.52 (H) 0.44 - 1.00 mg/dL   Calcium 8.6 (L) 8.9 - 10.3 mg/dL   Total Protein 6.5 6.5 - 8.1 g/dL   Albumin 3.0 (L) 3.5 - 5.0 g/dL   AST 22 15 - 41 U/L   ALT 27 14 - 54 U/L   Alkaline Phosphatase 92 38 - 126  U/L   Total Bilirubin 0.9 0.3 - 1.2 mg/dL   GFR calc non Af Amer 35 (L) >60 mL/min   GFR calc Af Amer 40 (L) >60 mL/min    Comment: (NOTE) The eGFR has been calculated using the CKD EPI equation. This calculation has not been validated in all clinical situations. eGFR's persistently <60 mL/min signify possible Chronic Kidney Disease.    Anion gap 17 (H) 5 - 15  CBC with Differential     Status: Abnormal   Collection Time: 05/22/15 11:53 AM  Result Value Ref Range   WBC 8.4 4.0 - 10.5 K/uL   RBC 4.12 3.87 - 5.11 MIL/uL   Hemoglobin 13.2 12.0 - 15.0 g/dL   HCT 38.0 36.0 - 46.0 %   MCV 92.2 78.0 - 100.0 fL   MCH 32.0 26.0 - 34.0 pg   MCHC 34.7 30.0 - 36.0 g/dL   RDW 14.7 11.5 - 15.5 %   Platelets 254 150 - 400 K/uL   Neutrophils Relative % 70 43 - 77 %   Neutro Abs 5.9 1.7 - 7.7 K/uL   Lymphocytes Relative 13 12 - 46 %   Lymphs Abs 1.1 0.7 - 4.0 K/uL   Monocytes Relative 17 (H) 3 - 12 %   Monocytes Absolute 1.4 (H) 0.1 - 1.0 K/uL   Eosinophils Relative 0 0 - 5 %   Eosinophils Absolute 0.0 0.0 - 0.7 K/uL   Basophils Relative 0 0 - 1 %   Basophils Absolute 0.0 0.0 - 0.1 K/uL  Lipase, blood     Status: Abnormal   Collection Time: 05/22/15 11:53 AM  Result Value Ref Range   Lipase 57 (H) 22 - 51 U/L  I-Stat CG4 Lactic Acid, ED     Status: Abnormal   Collection Time: 05/22/15 12:04 PM  Result Value Ref Range   Lactic Acid, Venous 2.48 (HH) 0.5 - 2.0 mmol/L   Comment NOTIFIED PHYSICIAN     No results found.  Review of Systems  Constitutional: Positive for weight loss, malaise/fatigue and diaphoresis. Negative for fever and chills.  HENT: Negative.   Eyes: Negative.   Respiratory: Negative.   Cardiovascular: Negative.   Gastrointestinal: Positive for nausea, vomiting, abdominal pain, diarrhea and constipation. Negative for blood in stool and melena.  Genitourinary: Positive for dysuria. Negative for urgency, frequency, hematuria and flank pain.  Musculoskeletal: Negative.    Skin: Negative.   Neurological: Negative.   Endo/Heme/Allergies: Negative.   Psychiatric/Behavioral: Negative.    Blood pressure 104/64, pulse 82, temperature 97.9 F (36.6 C), temperature  source Oral, resp. rate 16, height '5\' 6"'  (1.676 m), weight 58.968 kg (130 lb), SpO2 99 %. Physical Exam  Constitutional: She is oriented to person, place, and time. She appears well-developed and well-nourished. No distress.  HENT:  Head: Normocephalic and atraumatic.  Mouth/Throat: Oropharyngeal exudate present.  Neck: Normal range of motion. Neck supple.  Cardiovascular: Normal rate, regular rhythm, normal heart sounds and intact distal pulses.  Exam reveals no gallop and no friction rub.   No murmur heard. Respiratory: Effort normal and breath sounds normal. No respiratory distress. She has no wheezes. She has no rales. She exhibits no tenderness.  GI: Soft. Bowel sounds are normal. She exhibits distension. She exhibits no mass. There is no rebound and no guarding.  TTP mostly to upper abdomen, soft, but distended.  Musculoskeletal: Normal range of motion. She exhibits no edema or tenderness.  Lymphadenopathy:    She has no cervical adenopathy.  Neurological: She is alert and oriented to person, place, and time.  Skin: Skin is warm and dry. No rash noted. She is not diaphoretic. No erythema. No pallor.  Psychiatric: She has a normal mood and affect. Her behavior is normal. Judgment and thought content normal.    Assessment/Plan: Pancreatic mass concerning for metastatic disease-recommend IR consult for tissue diagnosis, CA 19-9, CA 125, CEA, may also need to have oncology on board.  No surgical role at this time. Small bowel obstruction-no acute abdomen on exam.  AXR to evaluate the progression of contrast, likely will need NGT to LIWS, NPO, IVF, pain control and anti-emetics.   AKI-per medicine  Weigh loss/PCM-prealbumin   Thank you for the consult.  Surgery will follow along.   Maicey Barrientez,  Sanel Stemmer ANP-BC 05/22/2015, 2:00 PM

## 2015-05-23 ENCOUNTER — Inpatient Hospital Stay (HOSPITAL_COMMUNITY): Payer: BC Managed Care – PPO

## 2015-05-23 ENCOUNTER — Encounter (HOSPITAL_COMMUNITY): Payer: Self-pay | Admitting: Radiology

## 2015-05-23 DIAGNOSIS — N1330 Unspecified hydronephrosis: Secondary | ICD-10-CM

## 2015-05-23 DIAGNOSIS — N179 Acute kidney failure, unspecified: Secondary | ICD-10-CM

## 2015-05-23 LAB — CBC
HCT: 32.8 % — ABNORMAL LOW (ref 36.0–46.0)
Hemoglobin: 11.1 g/dL — ABNORMAL LOW (ref 12.0–15.0)
MCH: 31.8 pg (ref 26.0–34.0)
MCHC: 33.8 g/dL (ref 30.0–36.0)
MCV: 94 fL (ref 78.0–100.0)
PLATELETS: 251 10*3/uL (ref 150–400)
RBC: 3.49 MIL/uL — AB (ref 3.87–5.11)
RDW: 15.1 % (ref 11.5–15.5)
WBC: 7.4 10*3/uL (ref 4.0–10.5)

## 2015-05-23 LAB — COMPREHENSIVE METABOLIC PANEL
ALBUMIN: 2.2 g/dL — AB (ref 3.5–5.0)
ALK PHOS: 75 U/L (ref 38–126)
ALT: 21 U/L (ref 14–54)
AST: 15 U/L (ref 15–41)
Anion gap: 10 (ref 5–15)
BILIRUBIN TOTAL: 0.7 mg/dL (ref 0.3–1.2)
BUN: 18 mg/dL (ref 6–20)
CO2: 21 mmol/L — ABNORMAL LOW (ref 22–32)
Calcium: 7.7 mg/dL — ABNORMAL LOW (ref 8.9–10.3)
Chloride: 107 mmol/L (ref 101–111)
Creatinine, Ser: 1.32 mg/dL — ABNORMAL HIGH (ref 0.44–1.00)
GFR calc Af Amer: 48 mL/min — ABNORMAL LOW (ref >60)
GFR calc non Af Amer: 41 mL/min — ABNORMAL LOW (ref >60)
Glucose, Bld: 127 mg/dL — ABNORMAL HIGH (ref 65–99)
Potassium: 3.8 mmol/L (ref 3.5–5.1)
SODIUM: 138 mmol/L (ref 135–145)
Total Protein: 5.3 g/dL — ABNORMAL LOW (ref 6.5–8.1)

## 2015-05-23 LAB — URINE CULTURE

## 2015-05-23 LAB — MAGNESIUM: Magnesium: 1.9 mg/dL (ref 1.7–2.4)

## 2015-05-23 LAB — CA 125: CA 125: 354.5 U/mL — AB (ref 0.0–38.1)

## 2015-05-23 LAB — CANCER ANTIGEN 19-9
CA 19 9: 4179 U/mL — AB (ref 0–35)
CA 19-9: 4102 U/mL — ABNORMAL HIGH (ref 0–35)

## 2015-05-23 LAB — CEA
CEA: 22.2 ng/mL — ABNORMAL HIGH (ref 0.0–4.7)
CEA: 22.3 ng/mL — ABNORMAL HIGH (ref 0.0–4.7)

## 2015-05-23 MED ORDER — ENOXAPARIN SODIUM 40 MG/0.4ML ~~LOC~~ SOLN
40.0000 mg | Freq: Every day | SUBCUTANEOUS | Status: DC
  Administered 2015-05-26 – 2015-06-07 (×12): 40 mg via SUBCUTANEOUS
  Filled 2015-05-23 (×15): qty 0.4

## 2015-05-23 MED ORDER — ENOXAPARIN SODIUM 40 MG/0.4ML ~~LOC~~ SOLN: 40.0000 mg | Freq: Every day | SUBCUTANEOUS | Status: DC

## 2015-05-23 NOTE — Progress Notes (Signed)
[facility] PROGRESS NOTE  Ashley Hurley OIN:[id number] DOB: [birth year] DOA: 05/22/2015  PCP: Lottie Dawson, MD  Brief HPI: 64 year old Caucasian female with a past medical history of hypertension, previous history of breast cancer, presented with nausea, vomiting, abdominal pain. She was found to have small bowel obstruction along with a pancreatic mass. Liver mass, lymphadenopathy. She was admitted for further management.  Past medical history:  Past Medical History  Diagnosis Date  . Cancer     breast  . Hypertension   . Hyperlipidemia   . Allergy   . Chocolate cyst of ovary   . Breast cancer, right breast Jan 2001    [id number] radiation and chemo granfortuna    Consultants: Gen. surgery, interventional radiology  Procedures: None yet  Antibiotics: None  Subjective: Patient feels better this morning. States that her abdomen is not as distended. She did have some nausea overnight but no vomiting. Denies any abdominal pain currently. Not passing any gas from below.  Objective: Vital Signs  Filed Vitals:   05/22/15 1500 05/22/15 1618 05/22/15 2247 05/23/15 0522  BP: 111/75 109/65 97/66 102/68  Pulse: 107 88 105 117  Temp:  98 F (36.7 C) 98.6 F (37 C) 98.4 F (36.9 C)  TempSrc:  Oral Oral Oral  Resp: 16 16 13 16   Height:      Weight:      SpO2: 98% 99% 96% 94%    Intake/Output Summary (Last 24 hours) at 05/23/15 1009 Last data filed at 05/23/15 0736  Gross per 24 hour  Intake    275 ml  Output    350 ml  Net    -75 ml   Filed Weights   05/22/15 1101  Weight: 58.968 kg (130 lb)    General appearance: alert, cooperative, appears stated age and no distress Head: Normocephalic, without obvious abnormality, atraumatic, NG tube noted Resp: clear to auscultation bilaterally Cardio: regular rate and rhythm, S1, S2 normal, no murmur, click, rub or gallop GI: soft, mildly tender in the lower abdomen without any rebound, rigidity or guarding. No  masses or organomegaly. Bowel sounds present but sluggish.  Extremities: extremities normal, atraumatic, no cyanosis or edema Neurologic: No focal deficits  Lab Results:  Basic Metabolic Panel:  Recent Labs Lab 05/18/15 0838 05/22/15 1153 05/23/15 0523  NA 131* 132* 138  K 4.3 3.5 3.8  CL 98 95* 107  CO2 25 20* 21*  GLUCOSE 150* 168* 127*  BUN 16 18 18   CREATININE 1.13 1.52* 1.32*  CALCIUM 8.7 8.6* 7.7*   Liver Function Tests:  Recent Labs Lab 05/22/15 1153 05/23/15 0523  AST 22 15  ALT 27 21  ALKPHOS 92 75  BILITOT 0.9 0.7  PROT 6.5 5.3*  ALBUMIN 3.0* 2.2*    Recent Labs Lab 05/22/15 1153  LIPASE 57*   CBC:  Recent Labs Lab 05/22/15 1153 05/23/15 0523  WBC 8.4 7.4  NEUTROABS 5.9  --   HGB 13.2 11.1*  HCT 38.0 32.8*  MCV 92.2 94.0  PLT 254 251    Recent Results (from the past 240 hour(s))  Culture, Urine     Status: None   Collection Time: 05/18/15  8:38 AM  Result Value Ref Range Status   Colony Count 5,000 COLONIES/ML  Final   Organism ID, Bacteria Insignificant Growth  Final      Studies/Results: Dg Abd 1 View  05/23/2015   CLINICAL DATA:  Follow-up small bowel obstruction. Subsequent encounter.  EXAM: ABDOMEN - 1 VIEW  COMPARISON:  Abdominal radiograph performed 05/22/2015  FINDINGS: Persistent dilated small bowel loops are seen, measuring up to 4.9 cm in diameter, concerning for persistent small bowel obstruction. There are nondistended loops of small bowel interspersed with the distended loops, and some of this may reflect underlying ileitis as noted on recent CT, in conjunction with the patient's known abdominal masses.  Contrast is noted within the descending colon and rectum, as on the prior study. No free intra-abdominal air is identified, though evaluation for free air is limited on a single supine view. An enteric tube is noted ending overlying the body of the stomach. No acute osseous abnormalities are seen.  IMPRESSION: Persistent  dilated small bowel loops, measuring up to 4.9 cm in diameter, concerning for persistent small bowel obstruction. Underlying nondistended loops small bowel are also seen. Some of this may reflect underlying ileitis as noted on recent CT, in conjunction with obstruction from the patient's known right lower quadrant mass.   Electronically Signed   By: Garald Balding M.D.   On: 05/23/2015 09:22   Dg Abd Acute W/chest  05/22/2015   CLINICAL DATA:  Acute lower abdominal pain.  EXAM: DG ABDOMEN ACUTE W/ 1V CHEST  COMPARISON:  CT scan of May 19, 2015.  FINDINGS: Dilated small bowel loops are noted concerning for distal small bowel obstruction. Residual contrast is seen in nondilated distal colon and rectum. Heart size and mediastinal contours are within normal limits. Both lungs are clear.  IMPRESSION: Dilated small bowel loops are noted consistent with distal small bowel obstruction.   Electronically Signed   By: Marijo Conception, M.D.   On: 05/22/2015 14:54   Dg Loyce Dys Tube Plc W/fl W/rad  05/22/2015   CLINICAL DATA:  Nasogastric tube placement requested  EXAM: NASO G TUBE PLACEMENT WITH FL AND WITH RAD  CONTRAST:  5 cc Omnipaque injected per NG tube  FLUOROSCOPY TIME:  Fluoroscopy Time (in minutes and seconds): 1 minutes 6 seconds  COMPARISON:  No similar prior exam is available at this institution for comparison or on [facility].  FINDINGS: Under fluoroscopic guidance, a 32 French nasogastric tube was inserted via the left nare, with visualization of termination of the tip over the left upper quadrant. 5 cc injected Omnipaque contrast verified intragastric positioning of the nasogastric tube. Return of clear yellow gastric content per NG tube was also visualized. The nasogastric tube was secured with tape and the patient returned to the nursing unit in stable position.  IMPRESSION: Fluoroscopically guided nasogastric tube placement with tip in the stomach.   Electronically Signed   By: Conchita Paris M.D.   On:  05/22/2015 18:52    Medications:  Scheduled: . enoxaparin (LOVENOX) injection  40 mg Subcutaneous Q24H  . [START ON 05/26/2015] enoxaparin (LOVENOX) injection  40 mg Subcutaneous QHS  . pantoprazole (PROTONIX) IV  40 mg Intravenous Q24H   Continuous: . sodium chloride 100 mL/hr at 05/23/15 0204   BPZ:WCHENIDPOEUMP **OR** acetaminophen, HYDROmorphone (DILAUDID) injection, ondansetron (ZOFRAN) IV, promethazine  Assessment/Plan:  Active Problems:   Liver mass, left lobe   Pancreatic mass   SBO (small bowel obstruction)    Small bowel obstruction Patient feels better after NG tube placement and decompression. General surgery is following. Continue IV fluids  Pancreatic mass with diffuse metastatic process Interventional radiology to perform biopsy on Monday. CA 19-9 is 4179. CA 125 is 354.   Mild acute renal failure with right-sided hydronephrosis Creatinine is improving with IV hydration. Abdominal metastatic process is likely responsible  for the right hydronephrosis. Continue to monitor Urine output. Will consult urology to give their opinion regarding the abnormal finding.  Brief episode of SVT Patient was asymptomatic per nurse. EKG does not show any concerning changes except for mild tachycardia. Potassium level was normal this morning. We'll check magnesium as well. Continue to monitor on telemetry for now. TSH and free T4 was normal earlier this month.  History of essential hypertension Blood pressure medications on hold.  History of anxiety and depression Lexapro on hold  DVT Prophylaxis: Lovenox    Code Status: Full code  Family Communication: Discussed with the patient  Disposition Plan: Await biopsy on Monday. Await improvement in small bowel obstruction. Urology to see.    LOS: 1 day   [facility] Pager [phone] 05/23/2015, 10:09 AM  If 7PM-7AM, please contact night-coverage at [email or web address], password [facility]

## 2015-05-23 NOTE — Consult Note (Signed)
Urology Consult   Physician requesting consult: Bonnielee Haff  Reason for consult: Hydronephrosis  History of Present Illness: Ashley Hurley is a 64 y.o. female with history of hypertension, right breast CA status post mastectomy who presented to the ER on 6/17 with nausea, vomiting, abdominal pain. She's been vomiting off and on for 2 weeks now this worsened significantly in the last 2 days in addition has diffuse crampy abdominal pain primarily in the upper abdomen.  In addition she's had weight loss about 10-15 pounds in the last month.  CT scan on 6/14 which showed pancreatic mass, liver mass, lymphadenopathy, SBO, hydronephrosis.  In the ER labs remarkable for mild hyponatremia, mild a GI creatinine of 1.5, lactic acid of 2.4.  Urinalysis showed small blood with ketones and protein - nitrite and leukocyte negative.    We were consulted for new finding of right hydronephrosis.    She denies a history of voiding or storage urinary symptoms, hematuria, UTIs, STDs, urolithiasis, GU malignancy/trauma/surgery.  Review of the CT scan shows significant right hydroureteronephrosis down to the level of a soft tissue density in the right pelvis / adnexal region.  Baseline Cr is around 0.8 from January of this year.  It was 1.5 on admission and has trended down to 1.3 since then.  Symptomatically she has been complaining of abdominal pain but has not been having any flank pain.  Past Medical History  Diagnosis Date  . Cancer     breast  . Hypertension   . Hyperlipidemia   . Allergy   . Chocolate cyst of ovary   . Breast cancer, right breast Jan 2001    IIBT3N0 radiation and chemo granfortuna    Past Surgical History  Procedure Laterality Date  . Breast surgery      rt mastectomy  . Oophorectomy       Current Hospital Medications:  Home meds:    Medication List    ASK your doctor about these medications        escitalopram 20 MG tablet  Commonly known as:  LEXAPRO  TAKE 1  TABLET EVERY DAY     lisinopril-hydrochlorothiazide 10-12.5 MG per tablet  Commonly known as:  PRINZIDE,ZESTORETIC  TAKE 1 TABLET BY MOUTH EVERY DAY     niacin 750 MG CR tablet  Commonly known as:  NIASPAN  Take 2 tablets (1,500 mg total) by mouth at bedtime.     omeprazole 40 MG capsule  Commonly known as:  PRILOSEC  Take 1 capsule (40 mg total) by mouth daily.     oxyCODONE-acetaminophen 5-325 MG per tablet  Commonly known as:  PERCOCET/ROXICET  Take 1 tablet by mouth 2 (two) times daily.        Scheduled Meds: . [START ON 05/26/2015] enoxaparin (LOVENOX) injection  40 mg Subcutaneous QHS  . pantoprazole (PROTONIX) IV  40 mg Intravenous Q24H   Continuous Infusions: . sodium chloride 100 mL/hr at 05/23/15 0204   PRN Meds:.acetaminophen **OR** acetaminophen, HYDROmorphone (DILAUDID) injection, ondansetron (ZOFRAN) IV, promethazine  Allergies:  Allergies  Allergen Reactions  . Aspirin     Family History  Problem Relation Age of Onset  . Coronary artery disease Mother 32  . Diabetes Mother   . Cancer Father 19    pancreatic  . Cancer Sister     breast  . Hypertension      family hx  . Hypertension Sister   . Stroke Sister   . Colon cancer Neg Hx   . Rectal cancer Neg  Hx   . Stomach cancer Neg Hx     Social History:  reports that she has never smoked. She does not have any smokeless tobacco history on file. She reports that she drinks alcohol. She reports that she does not use illicit drugs.  ROS: A complete review of systems was performed.  All systems are negative except for pertinent findings as noted.  Physical Exam:  Vital signs in last 24 hours: Temp:  [98.4 F (36.9 C)-98.6 F (37 C)] 98.4 F (36.9 C) (06/18 0522) Pulse Rate:  [105-117] 117 (06/18 0522) Resp:  [13-16] 16 (06/18 0522) BP: (97-102)/(66-68) 102/68 mmHg (06/18 0522) SpO2:  [94 %-96 %] 94 % (06/18 0522) Constitutional:  Alert and oriented, No acute distress Cardiovascular: Regular  rate and rhythm, No JVD Respiratory: Normal respiratory effort, Lungs clear bilaterally GI: Abdomen is soft but tender to palpation GU: No CVA tenderness Lymphatic: No lymphadenopathy Neurologic: Grossly intact, no focal deficits Psychiatric: Normal mood and affect  Laboratory Data:   Recent Labs  05/22/15 1153 05/23/15 0523  WBC 8.4 7.4  HGB 13.2 11.1*  HCT 38.0 32.8*  PLT 254 251     Recent Labs  05/22/15 1153 05/23/15 0523  NA 132* 138  K 3.5 3.8  CL 95* 107  GLUCOSE 168* 127*  BUN 18 18  CALCIUM 8.6* 7.7*  CREATININE 1.52* 1.32*     Results for orders placed or performed during the hospital encounter of 05/22/15 (from the past 24 hour(s))  CBC     Status: Abnormal   Collection Time: 05/23/15  5:23 AM  Result Value Ref Range   WBC 7.4 4.0 - 10.5 K/uL   RBC 3.49 (L) 3.87 - 5.11 MIL/uL   Hemoglobin 11.1 (L) 12.0 - 15.0 g/dL   HCT 32.8 (L) 36.0 - 46.0 %   MCV 94.0 78.0 - 100.0 fL   MCH 31.8 26.0 - 34.0 pg   MCHC 33.8 30.0 - 36.0 g/dL   RDW 15.1 11.5 - 15.5 %   Platelets 251 150 - 400 K/uL  Comprehensive metabolic panel     Status: Abnormal   Collection Time: 05/23/15  5:23 AM  Result Value Ref Range   Sodium 138 135 - 145 mmol/L   Potassium 3.8 3.5 - 5.1 mmol/L   Chloride 107 101 - 111 mmol/L   CO2 21 (L) 22 - 32 mmol/L   Glucose, Bld 127 (H) 65 - 99 mg/dL   BUN 18 6 - 20 mg/dL   Creatinine, Ser 1.32 (H) 0.44 - 1.00 mg/dL   Calcium 7.7 (L) 8.9 - 10.3 mg/dL   Total Protein 5.3 (L) 6.5 - 8.1 g/dL   Albumin 2.2 (L) 3.5 - 5.0 g/dL   AST 15 15 - 41 U/L   ALT 21 14 - 54 U/L   Alkaline Phosphatase 75 38 - 126 U/L   Total Bilirubin 0.7 0.3 - 1.2 mg/dL   GFR calc non Af Amer 41 (L) >60 mL/min   GFR calc Af Amer 48 (L) >60 mL/min   Anion gap 10 5 - 15  Magnesium     Status: None   Collection Time: 05/23/15  2:10 PM  Result Value Ref Range   Magnesium 1.9 1.7 - 2.4 mg/dL   Recent Results (from the past 240 hour(s))  Culture, Urine     Status: None    Collection Time: 05/18/15  8:38 AM  Result Value Ref Range Status   Colony Count 5,000 COLONIES/ML  Final   Organism  ID, Bacteria Insignificant Growth  Final  Blood culture (routine x 2)     Status: None (Preliminary result)   Collection Time: 05/22/15 11:43 AM  Result Value Ref Range Status   Specimen Description BLOOD HAND RIGHT  Final   Special Requests BOTTLES DRAWN AEROBIC ONLY 3CC  Final   Culture NO GROWTH 1 DAY  Final   Report Status PENDING  Incomplete  Blood culture (routine x 2)     Status: None (Preliminary result)   Collection Time: 05/22/15 12:42 PM  Result Value Ref Range Status   Specimen Description BLOOD ARM LEFT  Final   Special Requests BOTTLES DRAWN AEROBIC AND ANAEROBIC 5CC  Final   Culture NO GROWTH 1 DAY  Final   Report Status PENDING  Incomplete  Urine culture     Status: None   Collection Time: 05/22/15  1:21 PM  Result Value Ref Range Status   Specimen Description URINE, CLEAN CATCH  Final   Special Requests NONE  Final   Culture   Final    MULTIPLE SPECIES PRESENT, SUGGEST RECOLLECTION IF CLINICALLY INDICATED   Report Status 05/23/2015 FINAL  Final  Blood culture (routine x 2)     Status: None (Preliminary result)   Collection Time: 05/22/15  5:05 PM  Result Value Ref Range Status   Specimen Description BLOOD LEFT ANTECUBITAL  Final   Special Requests BOTTLES DRAWN AEROBIC ONLY  8 CC  Final   Culture NO GROWTH < 24 HOURS  Final   Report Status PENDING  Incomplete    Renal Function:  Recent Labs  05/18/15 0838 05/22/15 1153 05/23/15 0523  CREATININE 1.13 1.52* 1.32*   Estimated Creatinine Clearance: 39.6 mL/min (by C-G formula based on Cr of 1.32).  Radiologic Imaging: Dg Abd 1 View  05/23/2015   CLINICAL DATA:  Follow-up small bowel obstruction. Subsequent encounter.  EXAM: ABDOMEN - 1 VIEW  COMPARISON:  Abdominal radiograph performed 05/22/2015  FINDINGS: Persistent dilated small bowel loops are seen, measuring up to 4.9 cm in diameter,  concerning for persistent small bowel obstruction. There are nondistended loops of small bowel interspersed with the distended loops, and some of this may reflect underlying ileitis as noted on recent CT, in conjunction with the patient's known abdominal masses.  Contrast is noted within the descending colon and rectum, as on the prior study. No free intra-abdominal air is identified, though evaluation for free air is limited on a single supine view. An enteric tube is noted ending overlying the body of the stomach. No acute osseous abnormalities are seen.  IMPRESSION: Persistent dilated small bowel loops, measuring up to 4.9 cm in diameter, concerning for persistent small bowel obstruction. Underlying nondistended loops small bowel are also seen. Some of this may reflect underlying ileitis as noted on recent CT, in conjunction with obstruction from the patient's known right lower quadrant mass.   Electronically Signed   By: Garald Balding M.D.   On: 05/23/2015 09:22   Dg Abd Acute W/chest  05/22/2015   CLINICAL DATA:  Acute lower abdominal pain.  EXAM: DG ABDOMEN ACUTE W/ 1V CHEST  COMPARISON:  CT scan of May 19, 2015.  FINDINGS: Dilated small bowel loops are noted concerning for distal small bowel obstruction. Residual contrast is seen in nondilated distal colon and rectum. Heart size and mediastinal contours are within normal limits. Both lungs are clear.  IMPRESSION: Dilated small bowel loops are noted consistent with distal small bowel obstruction.   Electronically Signed   By: Jeneen Rinks  Murlean Caller, M.D.   On: 05/22/2015 14:54   Dg Loyce Dys Tube Plc W/fl W/rad  05/22/2015   CLINICAL DATA:  Nasogastric tube placement requested  EXAM: NASO G TUBE PLACEMENT WITH FL AND WITH RAD  CONTRAST:  5 cc Omnipaque injected per NG tube  FLUOROSCOPY TIME:  Fluoroscopy Time (in minutes and seconds): 1 minutes 6 seconds  COMPARISON:  No similar prior exam is available at this institution for comparison or on [facility].   FINDINGS: Under fluoroscopic guidance, a 84 French nasogastric tube was inserted via the left nare, with visualization of termination of the tip over the left upper quadrant. 5 cc injected Omnipaque contrast verified intragastric positioning of the nasogastric tube. Return of clear yellow gastric content per NG tube was also visualized. The nasogastric tube was secured with tape and the patient returned to the nursing unit in stable position.  IMPRESSION: Fluoroscopically guided nasogastric tube placement with tip in the stomach.   Electronically Signed   By: Conchita Paris M.D.   On: 05/22/2015 18:52    I independently reviewed the above imaging studies.  Impression/Recommendation:  64 yo with multiple medical comorbidities who presents with right hydroureteronephrosis secondary to a soft tissue mass in the right lower pelvis.  She is currently being evaluated for a new pancreatic lesion and concern for metastatic lesion in the liver.  This is in the context of a history of right breast cancer as well.    Her chief complaint at admission was abdominal pain (2/2 SBO) which has improved with NG tube.  She is not having flank pain.   - Recommend conservative management of asymptomatic hydronephrosis at this time.   - We will follow her creatinine and see how much it improves with hydration (likely pre-renal component)  - Would consider ureteral stent if her creatinine does not continue to improve or is she were to require nephrotoxic chemotherapy in the future  - Her prognosis following biopsy on Monday will also influence our recommendations  I performed a history and physical examination of the patient and discussed his management with the resident.  I reviewed the resident's note and agree with the documented findings and plan of care    No flank pain; improved Cr not to baseline: will follow - no stent needed

## 2015-05-23 NOTE — Progress Notes (Signed)
MD [place] Pink paged, patient had a run of SVT per telemetry, EKG ordered.

## 2015-05-23 NOTE — Plan of Care (Signed)
Problem: Discharge Progression Outcomes Goal: Tolerating diet Outcome: Not Progressing Patient NPO

## 2015-05-23 NOTE — H&P (Signed)
Chief Complaint: Chief Complaint  Patient presents with  . Emesis  . Abdominal Pain   History of Present Illness: Ashley Hurley is a 64 y.o. female with history of right breast cancer and new pancreatic mass and liver lesion. She has had a H&P performed within the last 30 days, all history, medications, and exam have been reviewed. The patient admits to progressive N/V since consult done on 05/21/15 and presented to ED found to have SBO. IR will perform liver lesion biopsy on Monday if patient does not require surgery prior to this. She states her symptoms have improved since NGT placement. She denies any flatus or BM, last BM was Wednesday. She denies any chest pain, shortness of breath or palpitations. She denies any active signs of bleeding or excessive bruising. The patient denies any history of sleep apnea or chronic oxygen use. She has previously tolerated sedation without complications.   Past Medical History  Diagnosis Date  . Cancer     breast  . Hypertension   . Hyperlipidemia   . Allergy   . Chocolate cyst of ovary   . Breast cancer, right breast Jan 2001    [id number] radiation and chemo granfortuna    Past Surgical History  Procedure Laterality Date  . Breast surgery      rt mastectomy  . Oophorectomy      Allergies: Aspirin  Medications: Prior to Admission medications   Medication Sig Start Date End Date Taking? Authorizing Provider  escitalopram (LEXAPRO) 20 MG tablet TAKE 1 TABLET EVERY DAY 09/09/14  Yes Burnis Medin, MD  omeprazole (PRILOSEC) 40 MG capsule Take 1 capsule (40 mg total) by mouth daily. 05/01/15  Yes Laurey Morale, MD  oxyCODONE-acetaminophen (PERCOCET/ROXICET) 5-325 MG per tablet Take 1 tablet by mouth 2 (two) times daily.   Yes Historical Provider, MD  lisinopril-hydrochlorothiazide (PRINZIDE,ZESTORETIC) 10-12.5 MG per tablet TAKE 1 TABLET BY MOUTH EVERY DAY Patient not taking: Reported on 05/21/2015 09/09/14   Burnis Medin, MD  niacin  (NIASPAN) 750 MG CR tablet Take 2 tablets (1,500 mg total) by mouth at bedtime. Patient not taking: Reported on 05/19/2015 09/09/14   Burnis Medin, MD     Family History  Problem Relation Age of Onset  . Coronary artery disease Mother 21  . Diabetes Mother   . Cancer Father 3    pancreatic  . Cancer Sister     breast  . Hypertension      family hx  . Hypertension Sister   . Stroke Sister   . Colon cancer Neg Hx   . Rectal cancer Neg Hx   . Stomach cancer Neg Hx     History   Social History  . Marital Status: Single    Spouse Name: N/A  . Number of Children: N/A  . 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 Smoking status: Never Smoker   . Smokeless tobacco: Not on file  . Alcohol Use: 0.0 oz/week    3-4 Glasses of wine per week  . Drug Use: No  . Sexual Activity: Not on file   Other Topics Concern  . 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    Non smoker    Exercises some   G2P2   Tobacco   Etoh: ocass  caffiene coffee in am    HH of 2   Review of Systems: A 12 point ROS discussed and pertinent positives are indicated in the HPI above.  All other systems are negative.  Review of Systems  Vital Signs: BP 102/68 mmHg  Pulse 117  Temp(Src) 98.4 F (36.9 C) (Oral)  Resp 16  Ht 5\' 6"  (1.676 m)  Wt 130 lb (58.968 kg)  BMI 20.99 kg/m2  SpO2 94%  Physical Exam  Constitutional: She is oriented to person, place, and time. No distress.  HENT:  Head: Normocephalic and atraumatic.  NGT intact   Neck: No tracheal deviation present.  Cardiovascular: Regular rhythm.  Exam reveals no gallop and no friction rub.   No murmur heard. Tachycardic  Pulmonary/Chest: Effort normal and breath sounds normal. No respiratory distress. She has no wheezes. She has no rales.  Abdominal: Soft. She exhibits no  distension. There is no tenderness.  Neurological: She is alert and oriented to person, place, and time.  Skin: She is not diaphoretic.    Mallampati Score:  MD Evaluation Airway: WNL Heart: WNL Abdomen: WNL Chest/ Lungs: WNL ASA  Classification: 3 Mallampati/Airway Score: Two  Imaging: Dg Abd 1 View  05/23/2015   CLINICAL DATA:  Follow-up small bowel obstruction. Subsequent encounter.  EXAM: ABDOMEN - 1 VIEW  COMPARISON:  Abdominal radiograph performed 05/22/2015  FINDINGS: Persistent dilated small bowel loops are seen, measuring up to 4.9 cm in diameter, concerning for persistent small bowel obstruction. There are nondistended loops of small bowel interspersed with the distended loops, and some of this may reflect underlying ileitis as noted on recent CT, in conjunction with the patient's known abdominal masses.  Contrast is noted within the descending colon and rectum, as on the prior study. No free intra-abdominal air is identified, though evaluation for free air is limited on a single supine view. An enteric tube is noted ending overlying the body of the stomach. No acute osseous abnormalities are seen.  IMPRESSION: Persistent dilated small bowel loops, measuring up to 4.9 cm in diameter, concerning for persistent small bowel obstruction. Underlying nondistended loops small bowel are also seen. Some of this may reflect underlying ileitis as noted on recent CT, in conjunction with obstruction from the patient's known right lower quadrant mass.   Electronically Signed   By: Garald Balding M.D.   On: 05/23/2015 09:22   US Abdomen Complete  05/18/2015   CLINICAL DATA:  Nausea and vomiting, weight loss. Acute renal insufficiency.  EXAM: ULTRASOUND ABDOMEN COMPLETE  COMPARISON:  None.  FINDINGS: Gallbladder: Possible sludge and small gallstones are noted. Mild gallbladder wall thickening is noted measuring 6 mm. No pericholecystic fluid is noted. No sonographic Murphy's sign is noted.  Common bile  duct: Diameter: 9 mm which is mildly dilated.  Liver: 2.7 cm complex abnormality seen in left hepatic lobe concerning for possible neoplasm.  IVC: Visualized portion appears normal.  Pancreas: Visualized portion unremarkable.  Spleen: Size and appearance within normal limits.  Right Kidney: Length: 9.7 cm. Echogenicity within normal limits. Moderate hydronephrosis is noted.  Left Kidney: Length: 11.1 cm. Echogenicity within normal limits. Mild left hydronephrosis is noted.  Abdominal aorta: No aneurysm visualized.  Other findings: Minimal ascites is noted. Probable epigastric mass measuring 4.1 x 3.6 x 2.9 cm is noted.  IMPRESSION: Possible sludge in minimal cholelithiasis is noted with gallbladder wall thickening, but no pericholecystic fluid. Acute cholecystitis cannot be excluded, and HIDA scan may be performed for further evaluation.  2.7 cm  complex abnormality seen in left hepatic lobe which may represent mass or neoplasm. Also noted is possible epigastric mass measuring 4.1 x 3.6 x 2.9 cm. CT scan of the abdomen and pelvis with intravenous contrast administration is recommended for further evaluation.  Minimal left hydronephrosis is noted with moderate right hydronephrosis. Further evaluation with CT scan is recommended.   Electronically Signed   By: Marijo Conception, M.D.   On: 05/18/2015 16:59   Ct Abdomen Pelvis W Contrast  05/19/2015   CLINICAL DATA:  Nausea and vomiting for 3 weeks. Abdominal mass. Acute renal failure. Liver mass and hydronephrosis seen on recent ultrasound.  EXAM: CT ABDOMEN AND PELVIS WITH CONTRAST  TECHNIQUE: Multidetector CT imaging of the abdomen and pelvis was performed using the standard protocol following bolus administration of intravenous contrast.  CONTRAST:  83mL OMNIPAQUE IOHEXOL 300 MG/ML  SOLN  COMPARISON:  None.  FINDINGS: Lower Chest: No acute findings.  Hepatobiliary: Poorly defined hypovascular mass is seen in the lateral segment left hepatic lobe measuring 3.2 x 3.5  cm on image 15 of series 2.  Another low-attenuation lesion is seen in the left hepatic lobe at the falciform ligament measuring approximately 1.8 x 2.6 cm on image 20. This could be due to focal fatty infiltration although hepatic neoplasm cannot be excluded.  A 1 cm cyst is seen in the dome of the left hepatic lobe. Diffuse biliary ductal dilatation is demonstrated. No definite signs of acute cholecystitis.  Pancreas: An ill-defined hypovascular mass is seen which is centered in the body of the pancreas which measures 3.4 x 6.1 cm on image 22/series 2. Distal pancreatic atrophy noted. 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 There is no evidence of portal vein thrombus. There is no direct involvement of the celiac axis, superior mesenteric artery, or superior mesenteric vein.  Spleen: Within normal limits in size and appearance. Upper abdominal venous collaterals are seen likely due to splenic vein thrombosis.  Adrenals:  No masses identified.  Kidneys/Urinary Tract: No renal masses are identified. Moderate right hydroureteronephrosis is seen . There is a irregular soft tissue mass obstructing the pelvic portion of the right ureter, as described below. This mass is located in the right adnexal region adjacent to the right iliac vessels and measures approximately 3.4 x 2.8 cm on image 64/series 2. There is adjacent lymphadenopathy in the right lower quadrant mesentery.  Stomach/Bowel/Peritoneum: Distal small bowel obstruction is seen with transition point at site of mass in the right pelvis/adnexal region described below. Mild ascites seen in the perihepatic space and inferior pelvis.a  Vascular/Lymphatic: Mild lymphadenopathy in the porta hepatis portacaval space, measuring up to 15 mm. Right lower quadrant mesenteric lymphadenopathy is seen adjacent to the right adnexal mass, with largest lymph node measuring 10 mm on image  62/series 2.  Reproductive: Uterus is unremarkable. There is an irregular soft tissue mass in the right adnexa adjacent to the right iliac vessels measuring approximately 4 x 5 cm in maximum diameter on image 49/series 602. This mass obstructs the right ureter and distal small bowel loops in this region.  Other:  None.  Musculoskeletal:  No suspicious bone lesions identified.  IMPRESSION: 6 cm hypovascular mass centered in the pancreatic body, suspicious for primary pancreatic carcinoma. This mass shows encasement of the splenic artery and vein, with splenic vein thrombosis. Consider EUS for tissue diagnosis.  Diffuse biliary ductal dilatation, likely secondary to mild porta  hepatis lymphadenopathy/metastatic disease.  3.5 cm hypovascular mass in the lateral segment of the left hepatic lobe, suspicious for liver metastasis. Indeterminate left hepatic lobe lesion adjacent to the falciform ligament could represent focal fatty infiltration, although hepatic metastasis cannot be excluded.  Right hydroureteronephrosis and distal small bowel obstruction due to irregular soft tissue mass in the right adnexa approximately 4 x 5 cm. Adjacent mesenteric lymphadenopathy also seen. Differential diagnosis includes metastatic disease, or primary malignancies such as ovarian carcinoma, appendiceal carcinoma, or carcinoid tumor.  Mild ascites.   Electronically Signed   By: Earle Gell M.D.   On: 05/19/2015 14:22   US Renal  05/18/2015   CLINICAL DATA:  Acute renal insufficiency. Nausea vomiting. Weight loss.  EXAM: RENAL / URINARY TRACT ULTRASOUND COMPLETE  COMPARISON:  None.  FINDINGS: Right Kidney:  Length: 9.7 cm. Moderate right hydronephrosis. Right kidney is slightly echogenic suggesting associated medical renal disease. No focal renal mass.  Left Kidney:  Length: 11.1 cm. Minimal caliectasis noted over the lower pole calices. Left kidney is slightly echogenic suggesting the possibility associated chronic medical renal  disease. No focal renal mass.  Bladder:  No bladder distention.  Mild ascites.  IMPRESSION: 1. Moderate right hydronephrosis. Mild caliectasis left lower renal pole. No bladder distention.  2. Kidneys are slightly echogenic suggesting a component of chronic medical renal disease.  3. Mild ascites. Reference is made to abdominal ultrasound report for discussion of abdominal masses.   Electronically Signed   By: Marcello Moores  Register   On: 05/18/2015 16:56   Dg Abd Acute W/chest  05/22/2015   CLINICAL DATA:  Acute lower abdominal pain.  EXAM: DG ABDOMEN ACUTE W/ 1V CHEST  COMPARISON:  CT scan of May 19, 2015.  FINDINGS: Dilated small bowel loops are noted concerning for distal small bowel obstruction. Residual contrast is seen in nondilated distal colon and rectum. Heart size and mediastinal contours are within normal limits. Both lungs are clear.  IMPRESSION: Dilated small bowel loops are noted consistent with distal small bowel obstruction.   Electronically Signed   By: Marijo Conception, M.D.   On: 05/22/2015 14:54   Dg Loyce Dys Tube Plc W/fl W/rad  05/22/2015   CLINICAL DATA:  Nasogastric tube placement requested  EXAM: NASO G TUBE PLACEMENT WITH FL AND WITH RAD  CONTRAST:  5 cc Omnipaque injected per NG tube  FLUOROSCOPY TIME:  Fluoroscopy Time (in minutes and seconds): 1 minutes 6 seconds  COMPARISON:  No similar prior exam is available at this institution for comparison or on [facility].  FINDINGS: Under fluoroscopic guidance, a 25 French nasogastric tube was inserted via the left nare, with visualization of termination of the tip over the left upper quadrant. 5 cc injected Omnipaque contrast verified intragastric positioning of the nasogastric tube. Return of clear yellow gastric content per NG tube was also visualized. The nasogastric tube was secured with tape and the patient returned to the nursing unit in stable position.  IMPRESSION: Fluoroscopically guided nasogastric tube placement with tip in the  stomach.   Electronically Signed   By: Conchita Paris M.D.   On: 05/22/2015 18:52    Labs:  CBC:  Recent Labs  05/14/15 1119 05/22/15 1153 05/23/15 0523  WBC 11.0* 8.4 7.4  HGB 13.2 13.2 11.1*  HCT 39.0 38.0 32.8*  PLT 350.0 254 251    COAGS: No results for input(s): INR, APTT in the last 8760 hours.  BMP:  Recent Labs  05/14/15 1119 05/18/15 0838 05/22/15 1153  05/23/15 0523  NA 129* 131* 132* 138  K 3.3* 4.3 3.5 3.8  CL 95* 98 95* 107  CO2 23 25 20* 21*  GLUCOSE 206* 150* 168* 127*  BUN 30* 16 18 18   CALCIUM 9.2 8.7 8.6* 7.7*  CREATININE 1.71* 1.13 1.52* 1.32*  GFRNONAA  --   --  35* 41*  GFRAA  --   --  40* 48*    LIVER FUNCTION TESTS:  Recent Labs  12/23/14 1001 05/14/15 1119 05/22/15 1153 05/23/15 0523  BILITOT 0.5 0.9 0.9 0.7  AST 23 108* 22 15  ALT 24 74* 27 21  ALKPHOS 61 68 92 75  PROT 6.8 6.2 6.5 5.3*  ALBUMIN 3.8 3.5 3.0* 2.2*    TUMOR MARKERS:  Recent Labs  05/22/15 1705  CEA 22.2*  22.3*  CA199 4102*  4179*    Assessment and Plan: SBO, NGT intact symptoms improving-[facility] following  Pancreatic mass, liver lesions, RLQ abdominal mass, CT 05/19/15 History of right breast cancer Seen in consult with IR 05/21/15- see full consult Scheduled for Monday image guided liver lesion biopsy with sedation Will make NPO and order INR in preparation for Monday Risks and Benefits discussed with the patient including, but not limited to bleeding, infection, damage to adjacent structures or low yield requiring additional tests. All of the patient's questions were answered, patient is agreeable to proceed. Consent signed and in chart. Plans per [facility]   Signed: Hedy Jacob 05/23/2015, 10:39 AM

## 2015-05-23 NOTE — Progress Notes (Signed)
Subjective: NG placed last PM, nothing recorded yet, but cannister is full with a brownish feculent fluid.  The room is having trouble with the suction and she may have to go to another room.   She feels much better this AM.  She says she is less distended also. No flatus so far. Objective: Vital signs in last 24 hours: Temp:  [97.9 F (36.6 C)-98.6 F (37 C)] 98.4 F (36.9 C) (06/18 0522) Pulse Rate:  [73-147] 117 (06/18 0522) Resp:  [13-18] 16 (06/18 0522) BP: (90-117)/(64-77) 102/68 mmHg (06/18 0522) SpO2:  [94 %-100 %] 94 % (06/18 0522) Weight:  [58.968 kg (130 lb)] 58.968 kg (130 lb) (06/17 1101) Last BM Date: 05/20/15 I/O=? Creatinine is better, Na is better Prealbumin is 9.2 Intake/Output from previous day: 06/17 0701 - 06/18 0700 In: 275  Out: -  Intake/Output this shift: Total I/O In: -  Out: 350 [Urine:100; Emesis/NG output:250]  General appearance: alert, cooperative and no distress Resp: clear to auscultation bilaterally GI: soft not very distended, BS hyperactive, NG working well, no flatus so far.  Lab Results:   Recent Labs  05/22/15 1153 05/23/15 0523  WBC 8.4 7.4  HGB 13.2 11.1*  HCT 38.0 32.8*  PLT 254 251    BMET  Recent Labs  05/22/15 1153 05/23/15 0523  NA 132* 138  K 3.5 3.8  CL 95* 107  CO2 20* 21*  GLUCOSE 168* 127*  BUN 18 18  CREATININE 1.52* 1.32*  CALCIUM 8.6* 7.7*   PT/INR No results for input(s): LABPROT, INR in the last 72 hours.   Recent Labs Lab 05/22/15 1153 05/23/15 0523  AST 22 15  ALT 27 21  ALKPHOS 92 75  BILITOT 0.9 0.7  PROT 6.5 5.3*  ALBUMIN 3.0* 2.2*     Lipase     Component Value Date/Time   LIPASE 57* 05/22/2015 1153     Studies/Results: Dg Abd Acute W/chest  05/22/2015   CLINICAL DATA:  Acute lower abdominal pain.  EXAM: DG ABDOMEN ACUTE W/ 1V CHEST  COMPARISON:  CT scan of May 19, 2015.  FINDINGS: Dilated small bowel loops are noted concerning for distal small bowel obstruction.  Residual contrast is seen in nondilated distal colon and rectum. Heart size and mediastinal contours are within normal limits. Both lungs are clear.  IMPRESSION: Dilated small bowel loops are noted consistent with distal small bowel obstruction.   Electronically Signed   By: Marijo Conception, M.D.   On: 05/22/2015 14:54   Dg Loyce Dys Tube Plc W/fl W/rad  05/22/2015   CLINICAL DATA:  Nasogastric tube placement requested  EXAM: NASO G TUBE PLACEMENT WITH FL AND WITH RAD  CONTRAST:  5 cc Omnipaque injected per NG tube  FLUOROSCOPY TIME:  Fluoroscopy Time (in minutes and seconds): 1 minutes 6 seconds  COMPARISON:  No similar prior exam is available at this institution for comparison or on [facility].  FINDINGS: Under fluoroscopic guidance, a 48 French nasogastric tube was inserted via the left nare, with visualization of termination of the tip over the left upper quadrant. 5 cc injected Omnipaque contrast verified intragastric positioning of the nasogastric tube. Return of clear yellow gastric content per NG tube was also visualized. The nasogastric tube was secured with tape and the patient returned to the nursing unit in stable position.  IMPRESSION: Fluoroscopically guided nasogastric tube placement with tip in the stomach.   Electronically Signed   By: Conchita Paris M.D.   On: 05/22/2015 18:52  Medications: . enoxaparin (LOVENOX) injection  40 mg Subcutaneous Q24H  . pantoprazole (PROTONIX) IV  40 mg Intravenous Q24H   . sodium chloride 100 mL/hr at 05/23/15 0204    Assessment/Plan SBO  Pancreatic mass, possible liver mets, right lower abdominal mass Hx of right breast cancer Hypertension Mild renal insuffiencey Malnutrition prealbumin 9.2  - 05/22/15 Antibiotics:  None DVT:  Lovenox/SCD  Plan:  Continue NG today, contunue bowel rest today.  They are going to get her to a room with functional suction.  I will get a film in the AM. Just let her rest some today.  Continue hydration.    LOS: 1  day    Leigha Olberding 05/23/2015

## 2015-05-24 ENCOUNTER — Inpatient Hospital Stay (HOSPITAL_COMMUNITY): Payer: BC Managed Care – PPO

## 2015-05-24 DIAGNOSIS — R1010 Upper abdominal pain, unspecified: Secondary | ICD-10-CM

## 2015-05-24 DIAGNOSIS — R1013 Epigastric pain: Secondary | ICD-10-CM

## 2015-05-24 DIAGNOSIS — R109 Unspecified abdominal pain: Secondary | ICD-10-CM

## 2015-05-24 LAB — CBC
HCT: 29.7 % — ABNORMAL LOW (ref 36.0–46.0)
Hemoglobin: 9.9 g/dL — ABNORMAL LOW (ref 12.0–15.0)
MCH: 31.2 pg (ref 26.0–34.0)
MCHC: 33.3 g/dL (ref 30.0–36.0)
MCV: 93.7 fL (ref 78.0–100.0)
PLATELETS: 220 10*3/uL (ref 150–400)
RBC: 3.17 MIL/uL — ABNORMAL LOW (ref 3.87–5.11)
RDW: 15.4 % (ref 11.5–15.5)
WBC: 6.2 10*3/uL (ref 4.0–10.5)

## 2015-05-24 LAB — BASIC METABOLIC PANEL
Anion gap: 8 (ref 5–15)
BUN: 18 mg/dL (ref 6–20)
CO2: 22 mmol/L (ref 22–32)
Calcium: 7.9 mg/dL — ABNORMAL LOW (ref 8.9–10.3)
Chloride: 111 mmol/L (ref 101–111)
Creatinine, Ser: 1.34 mg/dL — ABNORMAL HIGH (ref 0.44–1.00)
GFR calc Af Amer: 47 mL/min — ABNORMAL LOW (ref >60)
GFR calc non Af Amer: 41 mL/min — ABNORMAL LOW (ref >60)
Glucose, Bld: 114 mg/dL — ABNORMAL HIGH (ref 65–99)
Potassium: 3.7 mmol/L (ref 3.5–5.1)
Sodium: 141 mmol/L (ref 135–145)

## 2015-05-24 LAB — PROTIME-INR
INR: 1.32 (ref 0.00–1.49)
PROTHROMBIN TIME: 16.5 s — AB (ref 11.6–15.2)

## 2015-05-24 NOTE — Progress Notes (Signed)
Patient room low wall intermittent suction does not work despite multiple in room consults and exchanges of vacuum by facilities who then deferred to portable equipment. Per PA Earnstine Regal at nurses station with myself and charge RN Delcine, patient needs to be transferred to a room where low wall intermittent suction works, or have a Gomco machine ordered and put on low suction. Charge RN Delcine verified she would order a Gomco machine. Will conn

## 2015-05-24 NOTE — Progress Notes (Signed)
Subjective: She feels better, less distension.  They still don't have a suction that works properly and they don't have a room to transfer there to.  Nurse has been going in and adjusting suction strength by hand since yesterday.  Drainage is clear and looks like more normal GI drainage, she reports some flatus. Objective: Vital signs in last 24 hours: Temp:  [98.1 F (36.7 C)] 98.1 F (36.7 C) (06/19 0519) Pulse Rate:  [100-108] 100 (06/19 0519) Resp:  [20] 20 (06/19 0519) BP: (97-103)/(58-63) 103/63 mmHg (06/19 0519) SpO2:  [95 %-97 %] 97 % (06/19 0519) Last BM Date: 05/20/15 1700 from the NG Only 100 urine output recorded Afebrile, BP down some last PM, HR stay around 100 CA 19-9  4179 CA 125 354.5 CEA  22.2 Creatinine is stable Film pending for today Intake/Output from previous day: 06/18 0701 - 06/19 0700 In: 2985 [I.V.:2985] Out: 1800 [Urine:100; Emesis/NG output:1700] Intake/Output this shift:    General appearance: alert, cooperative and no distress GI: soft, non-tender; few bowel sounds; no masses,  no organomegaly, some flatus  Lab Results:   Recent Labs  05/23/15 0523 05/24/15 0351  WBC 7.4 6.2  HGB 11.1* 9.9*  HCT 32.8* 29.7*  PLT 251 220    BMET  Recent Labs  05/23/15 0523 05/24/15 0351  NA 138 141  K 3.8 3.7  CL 107 111  CO2 21* 22  GLUCOSE 127* 114*  BUN 18 18  CREATININE 1.32* 1.34*  CALCIUM 7.7* 7.9*   PT/INR  Recent Labs  05/24/15 0351  LABPROT 16.5*  INR 1.32     Recent Labs Lab 05/22/15 1153 05/23/15 0523  AST 22 15  ALT 27 21  ALKPHOS 92 75  BILITOT 0.9 0.7  PROT 6.5 5.3*  ALBUMIN 3.0* 2.2*     Lipase     Component Value Date/Time   LIPASE 57* 05/22/2015 1153     Studies/Results: Dg Abd 1 View  05/23/2015   CLINICAL DATA:  Follow-up small bowel obstruction. Subsequent encounter.  EXAM: ABDOMEN - 1 VIEW  COMPARISON:  Abdominal radiograph performed 05/22/2015  FINDINGS: Persistent dilated small bowel loops  are seen, measuring up to 4.9 cm in diameter, concerning for persistent small bowel obstruction. There are nondistended loops of small bowel interspersed with the distended loops, and some of this may reflect underlying ileitis as noted on recent CT, in conjunction with the patient's known abdominal masses.  Contrast is noted within the descending colon and rectum, as on the prior study. No free intra-abdominal air is identified, though evaluation for free air is limited on a single supine view. An enteric tube is noted ending overlying the body of the stomach. No acute osseous abnormalities are seen.  IMPRESSION: Persistent dilated small bowel loops, measuring up to 4.9 cm in diameter, concerning for persistent small bowel obstruction. Underlying nondistended loops small bowel are also seen. Some of this may reflect underlying ileitis as noted on recent CT, in conjunction with obstruction from the patient's known right lower quadrant mass.   Electronically Signed   By: Garald Balding M.D.   On: 05/23/2015 09:22   Dg Abd Acute W/chest  05/22/2015   CLINICAL DATA:  Acute lower abdominal pain.  EXAM: DG ABDOMEN ACUTE W/ 1V CHEST  COMPARISON:  CT scan of May 19, 2015.  FINDINGS: Dilated small bowel loops are noted concerning for distal small bowel obstruction. Residual contrast is seen in nondilated distal colon and rectum. Heart size and mediastinal contours are within  normal limits. Both lungs are clear.  IMPRESSION: Dilated small bowel loops are noted consistent with distal small bowel obstruction.   Electronically Signed   By: Marijo Conception, M.D.   On: 05/22/2015 14:54   Dg Loyce Dys Tube Plc W/fl W/rad  05/22/2015   CLINICAL DATA:  Nasogastric tube placement requested  EXAM: NASO G TUBE PLACEMENT WITH FL AND WITH RAD  CONTRAST:  5 cc Omnipaque injected per NG tube  FLUOROSCOPY TIME:  Fluoroscopy Time (in minutes and seconds): 1 minutes 6 seconds  COMPARISON:  No similar prior exam is available at this  institution for comparison or on [facility].  FINDINGS: Under fluoroscopic guidance, a 54 French nasogastric tube was inserted via the left nare, with visualization of termination of the tip over the left upper quadrant. 5 cc injected Omnipaque contrast verified intragastric positioning of the nasogastric tube. Return of clear yellow gastric content per NG tube was also visualized. The nasogastric tube was secured with tape and the patient returned to the nursing unit in stable position.  IMPRESSION: Fluoroscopically guided nasogastric tube placement with tip in the stomach.   Electronically Signed   By: Conchita Paris M.D.   On: 05/22/2015 18:52    Medications: . [START ON 05/26/2015] enoxaparin (LOVENOX) injection  40 mg Subcutaneous QHS  . pantoprazole (PROTONIX) IV  40 mg Intravenous Q24H    Assessment/Plan SBO  Pancreatic mass, possible liver mets, right lower abdominal mass (Positive CEA, CA 125, and CA 19-9) Hx of right breast cancer Hypertension Mild renal insuffiencey - stable Malnutrition prealbumin 9.2 - 05/22/15 Antibiotics: None DVT: Lovenox/SCD   Plan:  I would continue suction, ambulate and await biopsy tomorrow.      LOS: 2 days    Ashley Hurley 05/24/2015

## 2015-05-24 NOTE — Progress Notes (Signed)
Patient ID: Ashley Hurley, female   DOB: [birth year], 64 y.o.   MRN: [id number] [facility] PROGRESS NOTE  Laylanie Kruczek YJE:[id number] DOB: 1951-03-04 DOA: 05/22/2015 PCP: Lottie Dawson, MD   Brief narrative:    64 year old female with HTN, history of breast cancer, presented to [facility] ED with main concern of several days duration of progressively worsening abd pain associated with nausea and non bloody vomiting. Current work up notable for SBO, pancreatic and liver mass and was admitted for further management.   Assessment/Plan:    Active Problems:   SBO - continue suctioning - appreciate surgery team assistance     Right hydroureteronephrosis  - secondary to a soft tissue mass in the right lower pelvis -  Urologist recommend conservative management of asymptomatic hydronephrosis at this time.  - also recommend monitoring Cr and since trending down, no stenting needed at this time  - further recommendations will also depend on biopsy results     Pancreatic mass with diffuse metastatic process - IR to perform biopsy on Monday. CA 19-9 is 4179. CA 125 is 354    Brief episode of SVT - remains asymptomatic - resolved     Anemia of chronic disease, ? Malignancy - anemia panel pending - drop in Hg since admission likely from IVF pt has been receiving  - no sings of active bleeding - repeat CBC in AM    Liver mass, left lobe - Scheduled for Monday image guided liver lesion biopsy with sedation - Will make NPO after midnight     Anxiety  - stable at this time     HTN - reasonable inpatient control off antihypertensives     Severe PCM - int he context of acute illness  DVT prophylaxis - Lovenox SQ  Code Status: Full.  Family Communication:  plan of care discussed with the patient Disposition Plan: Home when medically cleared, further work up needs for liver and pancreatic mass   IV access:  Peripheral IV  Procedures and diagnostic studies:    Dg Abd 1  View 05/23/2015   Persistent dilated small bowel loops, 4.9 cm in diameter, c/w SBO ? From RLQ mass  US Abdomen Complete 05/18/2015   2.7 cm complex abnormality seen in left hepatic lobe, ? Neoplasm, epigastric mass measuring 4.1 x 3.6 x 2.9 cm  Ct Abdomen Pelvis W Contrast 05/19/2015  6 cm mass in pancreatic body, ? pancreatic ca with splenic vein thrombosis. 3.5 cm mass in left hepatic lobe, ? liver met's. Right hydro  and distal SBO, d/d includes metastatic disease, or primary malignancies such as ovarian carcinoma, appendiceal carcinoma, or carcinoid tumor.   US Renal 05/18/2015   Moderate right hydronephrosis.     Dg Abd 2 Views 05/24/2015   Mild decrease in gaseous dilatation of small bowel loops which may indicate improving small bowel obstruction.     Dg Abd Acute W/chest 05/22/2015  Dilated small bowel loops are noted consistent with distal small bowel obstruction.     Dg Loyce Dys Tube Plc W/fl W/rad 05/22/2015  Fluoroscopically guided nasogastric tube placement with tip in the stomach.   Medical Consultants:  Urology for evaluation of hydronephrosis Surgery for SBO management IR for biopsy   Other Consultants:  None   IAnti-Infectives:   None   Faye Ramsay, MD  [facility] Pager [phone]  If 7PM-7AM, please contact night-coverage [email or web address] Password [facility] 05/24/2015, 2:32 PM   LOS: 2 days   HPI/Subjective: No events overnight.   Objective: Filed Vitals:  05/23/15 0522 05/23/15 2205 05/24/15 0519 05/24/15 1425  BP: 102/68 97/58 103/63 103/72  Pulse: 117 108 100 100  Temp: 98.4 F (36.9 C) 98.1 F (36.7 C) 98.1 F (36.7 C) 98.1 F (36.7 C)  TempSrc: Oral Oral Oral Oral  Resp: '16 20 20 18  ' Height:      Weight:      SpO2: 94% 95% 97% 96%    Intake/Output Summary (Last 24 hours) at 05/24/15 1432 Last data filed at 05/24/15 1352  Gross per 24 hour  Intake 948.33 ml  Output   1150 ml  Net -201.67 ml    Exam:   General:  Pt is alert, follows  commands appropriately, not in acute distress  Cardiovascular: Regular rate and rhythm, no rubs, no gallops  Respiratory: Clear to auscultation bilaterally, no wheezing, no crackles, no rhonchi  Abdomen: Soft, tender in epigastric area, bowel sounds present, no guarding  Extremities: pulses DP and PT palpable bilaterally  Neuro: Grossly nonfocal  Data Reviewed: Basic Metabolic Panel:  Recent Labs Lab 05/18/15 0838 05/22/15 1153 05/23/15 0523 05/23/15 1410 05/24/15 0351  NA 131* 132* 138  --  141  K 4.3 3.5 3.8  --  3.7  CL 98 95* 107  --  111  CO2 25 20* 21*  --  22  GLUCOSE 150* 168* 127*  --  114*  BUN '16 18 18  ' --  18  CREATININE 1.13 1.52* 1.32*  --  1.34*  CALCIUM 8.7 8.6* 7.7*  --  7.9*  MG  --   --   --  1.9  --    Liver Function Tests:  Recent Labs Lab 05/22/15 1153 05/23/15 0523  AST 22 15  ALT 27 21  ALKPHOS 92 75  BILITOT 0.9 0.7  PROT 6.5 5.3*  ALBUMIN 3.0* 2.2*    Recent Labs Lab 05/22/15 1153  LIPASE 57*   CBC:  Recent Labs Lab 05/22/15 1153 05/23/15 0523 05/24/15 0351  WBC 8.4 7.4 6.2  NEUTROABS 5.9  --   --   HGB 13.2 11.1* 9.9*  HCT 38.0 32.8* 29.7*  MCV 92.2 94.0 93.7  PLT 254 251 220   Recent Results (from the past 240 hour(s))  Culture, Urine     Status: None   Collection Time: 05/18/15  8:38 AM  Result Value Ref Range Status   Colony Count 5,000 COLONIES/ML  Final   Organism ID, Bacteria Insignificant Growth  Final  Blood culture (routine x 2)     Status: None (Preliminary result)   Collection Time: 05/22/15 11:43 AM  Result Value Ref Range Status   Specimen Description BLOOD HAND RIGHT  Final   Special Requests BOTTLES DRAWN AEROBIC ONLY 3CC  Final   Culture NO GROWTH 2 DAYS  Final   Report Status PENDING  Incomplete  Blood culture (routine x 2)     Status: None (Preliminary result)   Collection Time: 05/22/15 12:42 PM  Result Value Ref Range Status   Specimen Description BLOOD ARM LEFT  Final   Special Requests  BOTTLES DRAWN AEROBIC AND ANAEROBIC 5CC  Final   Culture NO GROWTH 2 DAYS  Final   Report Status PENDING  Incomplete  Urine culture     Status: None   Collection Time: 05/22/15  1:21 PM  Result Value Ref Range Status   Specimen Description URINE, CLEAN CATCH  Final   Special Requests NONE  Final   Culture   Final    MULTIPLE SPECIES PRESENT, SUGGEST  RECOLLECTION IF CLINICALLY INDICATED   Report Status 05/23/2015 FINAL  Final  Blood culture (routine x 2)     Status: None (Preliminary result)   Collection Time: 05/22/15  5:05 PM  Result Value Ref Range Status   Specimen Description BLOOD LEFT ANTECUBITAL  Final   Special Requests BOTTLES DRAWN AEROBIC ONLY  8 CC  Final   Culture NO GROWTH 2 DAYS  Final   Report Status PENDING  Incomplete     Scheduled Meds: . [START ON 05/26/2015] enoxaparin (LOVENOX) injection  40 mg Subcutaneous QHS  . pantoprazole (PROTONIX) IV  40 mg Intravenous Q24H   Continuous Infusions: . sodium chloride 100 mL/hr at 05/23/15 2206

## 2015-05-24 NOTE — Progress Notes (Signed)
Subjective: Continues to have persistent yet improved abdominal pain, tolerating the NG tube well.  No flank pain.  No new issues overnight.  Objective: Vital signs in last 24 hours: Temp:  [98.1 F (36.7 C)] 98.1 F (36.7 C) (06/19 0519) Pulse Rate:  [100-108] 100 (06/19 0519) Resp:  [20] 20 (06/19 0519) BP: (97-103)/(58-63) 103/63 mmHg (06/19 0519) SpO2:  [95 %-97 %] 97 % (06/19 0519)  Intake/Output from previous day: 06/18 0701 - 06/19 0700 In: 2985 [I.V.:2985] Out: 1800 [Urine:100; Emesis/NG output:1700] Intake/Output this shift:    Physical Exam:  General: Alert and oriented CV: RRR Lungs: Clear Abdomen: Soft, ND Ext: NT, No erythema  Lab Results:  Recent Labs  05/22/15 1153 05/23/15 0523 05/24/15 0351  HGB 13.2 11.1* 9.9*  HCT 38.0 32.8* 29.7*   BMET  Recent Labs  05/23/15 0523 05/24/15 0351  NA 138 141  K 3.8 3.7  CL 107 111  CO2 21* 22  GLUCOSE 127* 114*  BUN 18 18  CREATININE 1.32* 1.34*  CALCIUM 7.7* 7.9*     Studies/Results: Dg Abd 1 View  05/23/2015   CLINICAL DATA:  Follow-up small bowel obstruction. Subsequent encounter.  EXAM: ABDOMEN - 1 VIEW  COMPARISON:  Abdominal radiograph performed 05/22/2015  FINDINGS: Persistent dilated small bowel loops are seen, measuring up to 4.9 cm in diameter, concerning for persistent small bowel obstruction. There are nondistended loops of small bowel interspersed with the distended loops, and some of this may reflect underlying ileitis as noted on recent CT, in conjunction with the patient's known abdominal masses.  Contrast is noted within the descending colon and rectum, as on the prior study. No free intra-abdominal air is identified, though evaluation for free air is limited on a single supine view. An enteric tube is noted ending overlying the body of the stomach. No acute osseous abnormalities are seen.  IMPRESSION: Persistent dilated small bowel loops, measuring up to 4.9 cm in diameter, concerning for  persistent small bowel obstruction. Underlying nondistended loops small bowel are also seen. Some of this may reflect underlying ileitis as noted on recent CT, in conjunction with obstruction from the patient's known right lower quadrant mass.   Electronically Signed   By: Garald Balding M.D.   On: 05/23/2015 09:22   Dg Abd Acute W/chest  05/22/2015   CLINICAL DATA:  Acute lower abdominal pain.  EXAM: DG ABDOMEN ACUTE W/ 1V CHEST  COMPARISON:  CT scan of May 19, 2015.  FINDINGS: Dilated small bowel loops are noted concerning for distal small bowel obstruction. Residual contrast is seen in nondilated distal colon and rectum. Heart size and mediastinal contours are within normal limits. Both lungs are clear.  IMPRESSION: Dilated small bowel loops are noted consistent with distal small bowel obstruction.   Electronically Signed   By: Marijo Conception, M.D.   On: 05/22/2015 14:54   Dg Loyce Dys Tube Plc W/fl W/rad  05/22/2015   CLINICAL DATA:  Nasogastric tube placement requested  EXAM: NASO G TUBE PLACEMENT WITH FL AND WITH RAD  CONTRAST:  5 cc Omnipaque injected per NG tube  FLUOROSCOPY TIME:  Fluoroscopy Time (in minutes and seconds): 1 minutes 6 seconds  COMPARISON:  No similar prior exam is available at this institution for comparison or on [facility].  FINDINGS: Under fluoroscopic guidance, a 32 French nasogastric tube was inserted via the left nare, with visualization of termination of the tip over the left upper quadrant. 5 cc injected Omnipaque contrast verified intragastric positioning  of the nasogastric tube. Return of clear yellow gastric content per NG tube was also visualized. The nasogastric tube was secured with tape and the patient returned to the nursing unit in stable position.  IMPRESSION: Fluoroscopically guided nasogastric tube placement with tip in the stomach.   Electronically Signed   By: Conchita Paris M.D.   On: 05/22/2015 18:52    Assessment/Plan:  64 yo with right  hydroureteronephrosis secondary to a soft tissue mass in the right lower pelvis. She is currently being evaluated for a new pancreatic lesion and concern for metastatic lesion in the liver. She is not having flank pain.  INTERVAL CHANGE:  - Creatinine relatively unchanged at 1.34  - No change in recommendations from yesterday  - Recommend conservative management of asymptomatic hydronephrosis at this time.  - We will follow her creatinine and see how much it improves with hydration (likely pre-renal component) - Would consider ureteral stent if her creatinine does not continue to improve or is she were to require nephrotoxic chemotherapy in the future - Her prognosis following biopsy on Monday will also influence our recommendations   LOS: 2 days   Baltazar Najjar, Will 05/24/2015, 7:34 AM

## 2015-05-24 NOTE — Progress Notes (Signed)
Patient placed on Gomco machine for low wall intermittent suction as ordered by PA Creig Hines

## 2015-05-25 ENCOUNTER — Inpatient Hospital Stay (HOSPITAL_COMMUNITY): Payer: BC Managed Care – PPO

## 2015-05-25 ENCOUNTER — Other Ambulatory Visit (HOSPITAL_COMMUNITY): Payer: BC Managed Care – PPO

## 2015-05-25 ENCOUNTER — Ambulatory Visit (HOSPITAL_COMMUNITY): Admission: RE | Admit: 2015-05-25 | Payer: BC Managed Care – PPO | Source: Ambulatory Visit

## 2015-05-25 LAB — CBC
HCT: 28.2 % — ABNORMAL LOW (ref 36.0–46.0)
Hemoglobin: 9.2 g/dL — ABNORMAL LOW (ref 12.0–15.0)
MCH: 30.9 pg (ref 26.0–34.0)
MCHC: 32.6 g/dL (ref 30.0–36.0)
MCV: 94.6 fL (ref 78.0–100.0)
Platelets: 177 10*3/uL (ref 150–400)
RBC: 2.98 MIL/uL — ABNORMAL LOW (ref 3.87–5.11)
RDW: 15.5 % (ref 11.5–15.5)
WBC: 4.8 10*3/uL (ref 4.0–10.5)

## 2015-05-25 LAB — BASIC METABOLIC PANEL
Anion gap: 9 (ref 5–15)
BUN: 20 mg/dL (ref 6–20)
CHLORIDE: 112 mmol/L — AB (ref 101–111)
CO2: 22 mmol/L (ref 22–32)
Calcium: 8.3 mg/dL — ABNORMAL LOW (ref 8.9–10.3)
Creatinine, Ser: 1.29 mg/dL — ABNORMAL HIGH (ref 0.44–1.00)
GFR calc Af Amer: 49 mL/min — ABNORMAL LOW (ref >60)
GFR calc non Af Amer: 42 mL/min — ABNORMAL LOW (ref >60)
GLUCOSE: 110 mg/dL — AB (ref 65–99)
Potassium: 4.3 mmol/L (ref 3.5–5.1)
Sodium: 143 mmol/L (ref 135–145)

## 2015-05-25 LAB — IRON AND TIBC
Iron: 11 ug/dL — ABNORMAL LOW (ref 28–170)
Saturation Ratios: 6 % — ABNORMAL LOW (ref 10.4–31.8)
TIBC: 190 ug/dL — AB (ref 250–450)
UIBC: 179 ug/dL

## 2015-05-25 LAB — RETICULOCYTES
RBC.: 2.98 MIL/uL — AB (ref 3.87–5.11)
RETIC COUNT ABSOLUTE: 35.8 10*3/uL (ref 19.0–186.0)
Retic Ct Pct: 1.2 % (ref 0.4–3.1)

## 2015-05-25 LAB — FOLATE: Folate: 20.6 ng/mL (ref >5.9)

## 2015-05-25 LAB — FERRITIN: Ferritin: 138 ng/mL (ref 11–307)

## 2015-05-25 LAB — VITAMIN B12: VITAMIN B 12: 3054 pg/mL — AB (ref 180–914)

## 2015-05-25 MED ORDER — MIDAZOLAM HCL 2 MG/2ML IJ SOLN
INTRAMUSCULAR | Status: AC | PRN
  Administered 2015-05-25 (×2): 1 mg via INTRAVENOUS

## 2015-05-25 MED ORDER — LIDOCAINE HCL (PF) 1 % IJ SOLN
INTRAMUSCULAR | Status: AC
  Filled 2015-05-25: qty 10

## 2015-05-25 MED ORDER — GELATIN ABSORBABLE 12-7 MM EX MISC
CUTANEOUS | Status: AC
  Filled 2015-05-25: qty 1

## 2015-05-25 MED ORDER — FENTANYL CITRATE (PF) 100 MCG/2ML IJ SOLN
INTRAMUSCULAR | Status: AC
  Filled 2015-05-25: qty 2

## 2015-05-25 MED ORDER — MIDAZOLAM HCL 2 MG/2ML IJ SOLN
INTRAMUSCULAR | Status: AC
  Filled 2015-05-25: qty 2

## 2015-05-25 MED ORDER — LORAZEPAM 2 MG/ML IJ SOLN
0.5000 mg | Freq: Three times a day (TID) | INTRAMUSCULAR | Status: DC | PRN
  Administered 2015-05-25: 1 mg via INTRAVENOUS
  Administered 2015-05-26: 0.5 mg via INTRAVENOUS
  Administered 2015-05-26 – 2015-05-27 (×2): 1 mg via INTRAVENOUS
  Administered 2015-05-28: 0.5 mg via INTRAVENOUS
  Administered 2015-06-02: 1 mg via INTRAVENOUS
  Filled 2015-05-25 (×6): qty 1

## 2015-05-25 MED ORDER — FENTANYL CITRATE (PF) 100 MCG/2ML IJ SOLN
INTRAMUSCULAR | Status: AC | PRN
  Administered 2015-05-25: 50 ug via INTRAVENOUS

## 2015-05-25 NOTE — Progress Notes (Signed)
  Subjective: She feels much better.  Not much flatus, doing OK with suction working. Gomco machine.  Objective: Vital signs in last 24 hours: Temp:  [97.9 F (36.6 C)-98.3 F (36.8 C)] 98.3 F (36.8 C) (06/20 0508) Pulse Rate:  [97-108] 108 (06/20 0508) Resp:  [18-20] 20 (06/20 0508) BP: (95-103)/(60-72) 95/63 mmHg (06/20 0508) SpO2:  [96 %-98 %] 98 % (06/20 0508) Weight:  [60.5 kg (133 lb 6.1 oz)] 60.5 kg (133 lb 6.1 oz) (06/20 0500) Last BM Date: 05/20/15 1700 from NG Urine 750 Afebrile, VSS Creatinine is stable Intake/Output from previous day: 19-Jun-2023 0701 - 06/20 0700 In: 2191.7 [I.V.:2191.7] Out: 2450 [Urine:750; Emesis/NG output:1700] Intake/Output this shift:    General appearance: alert, cooperative and no distress GI: soft, less tender, no nausea, she is doing well with NG in place, not much flatus, no BM.  Lab Results:   Recent Labs  Jun 19, 2015 0351 05/25/15 0741  WBC 6.2 4.8  HGB 9.9* 9.2*  HCT 29.7* 28.2*  PLT 220 177    BMET  Recent Labs  2015/06/19 0351 05/25/15 0741  NA 141 143  K 3.7 4.3  CL 111 112*  CO2 22 22  GLUCOSE 114* 110*  BUN 18 20  CREATININE 1.34* 1.29*  CALCIUM 7.9* 8.3*   PT/INR  Recent Labs  June 19, 2015 0351  LABPROT 16.5*  INR 1.32     Recent Labs Lab 05/22/15 1153 05/23/15 0523  AST 22 15  ALT 27 21  ALKPHOS 92 75  BILITOT 0.9 0.7  PROT 6.5 5.3*  ALBUMIN 3.0* 2.2*     Lipase     Component Value Date/Time   LIPASE 57* 05/22/2015 1153     Studies/Results: Dg Abd 2 Views  Jun 19, 2015   CLINICAL DATA:  Small bowel obstruction  EXAM: ABDOMEN - 2 VIEW  COMPARISON:  05/23/2015  FINDINGS: Retained contrast within nondilated colon. Decrease in caliber of mildly dilated right lower quadrant loop of small bowel measuring 3.5 cm maximally. Clips project over the left abdomen. No acute osseous abnormality.  IMPRESSION: Mild decrease in gaseous dilatation of small bowel loops which may indicate improving small bowel  obstruction.   Electronically Signed   By: Conchita Paris M.D.   On: Jun 19, 2015 09:56    Medications: . [START ON 05/26/2015] enoxaparin (LOVENOX) injection  40 mg Subcutaneous QHS  . pantoprazole (PROTONIX) IV  40 mg Intravenous Q24H    Assessment/Plan SBO  Pancreatic mass, possible liver mets, right lower abdominal mass (Positive CEA, CA 125, and CA 19-9) Hx of right breast cancer Hypertension Mild renal insuffiencey - stable Malnutrition prealbumin 9.2 - 05/22/15 Antibiotics: None DVT: Lovenox/SCD   Plan:  Continue the NG for now.  She is for Liver bx today.  I will get a film in the AM and see how she looks.    LOS: 3 days    Frankie Zito 05/25/2015

## 2015-05-25 NOTE — Sedation Documentation (Signed)
o2 sat R AIR now 99%- 02 [place] ON JUST FOR PROCEDURAL SUPPORT

## 2015-05-25 NOTE — Progress Notes (Signed)
Patient ID: Ashley Hurley, female   DOB: 02-18-1951, 64 y.o.   MRN: [id number] [facility] PROGRESS NOTE  Ashley Hurley [id number] DOB: 1951/01/28 DOA: 05/22/2015 PCP: Lottie Dawson, MD   Brief narrative:    64 year old female with HTN, history of breast cancer, presented to [facility] ED with main concern of several days duration of progressively worsening abd pain associated with nausea and non bloody vomiting. Current work up notable for SBO, pancreatic and liver mass and was admitted for further management.   Assessment/Plan:    Active Problems:   SBO - Abdomen looks better this morning and patient reports feeling better - Keep NG for now - Appreciate surgery team following, plan is to get abdominal x-ray in am for follow-up     Right hydroureteronephrosis  - secondary to a soft tissue mass in the right lower pelvis - Urologist recommend conservative management of asymptomatic hydronephrosis at this time.  - also recommend monitoring Cr and since trending down, no stenting needed at this time  - further recommendations will also depend on biopsy results  - Repeat BMP in the morning    Acute renal failure - Secondary to right hydroureteronephrosis and prerenal etiology from dehydration in the setting of SBO - IV fluids provided, creatinine continues trending down - Repeat BMP in the morning    Pancreatic mass with diffuse metastatic process - IR to perform biopsy today. CA 19-9 is 4179. CA 125 is 354    Brief episode of SVT 6/18 - remains asymptomatic - resolved     Anemia of chronic disease, ? Malignancy - anemia panel pending - drop in Hg since admission likely from IVF pt has been receiving  - no sings of active bleeding - repeat CBC in AM    Liver mass, left lobe - Scheduled for image guided liver lesion biopsy with sedation today    Anxiety  - stable at this time     HTN - reasonable inpatient control off antihypertensives     Severe PCM - int  he context of acute illness  DVT prophylaxis - Lovenox SQ  Code Status: Full.  Family Communication:  plan of care discussed with the patient Disposition Plan: Home when medically cleared, further work up needs for liver and pancreatic mass   IV access:  Peripheral IV  Procedures and diagnostic studies:    Dg Abd 1 View 05/23/2015   Persistent dilated small bowel loops, 4.9 cm in diameter, c/w SBO ? From RLQ mass  US Abdomen Complete 05/18/2015   2.7 cm complex abnormality seen in left hepatic lobe, ? Neoplasm, epigastric mass measuring 4.1 x 3.6 x 2.9 cm  Ct Abdomen Pelvis W Contrast 05/19/2015  6 cm mass in pancreatic body, ? pancreatic ca with splenic vein thrombosis. 3.5 cm mass in left hepatic lobe, ? liver met's. Right hydro  and distal SBO, d/d includes metastatic disease, or primary malignancies such as ovarian carcinoma, appendiceal carcinoma, or carcinoid tumor.   US Renal 05/18/2015   Moderate right hydronephrosis.     Dg Abd 2 Views 05/24/2015   Mild decrease in gaseous dilatation of small bowel loops which may indicate improving small bowel obstruction.     Dg Abd Acute W/chest 05/22/2015  Dilated small bowel loops are noted consistent with distal small bowel obstruction.     Dg Loyce Dys Tube Plc W/fl W/rad 05/22/2015  Fluoroscopically guided nasogastric tube placement with tip in the stomach.   Medical Consultants:  Urology for evaluation of hydronephrosis Surgery for  SBO management IR for biopsy   Other Consultants:  None   IAnti-Infectives:   None   Faye Ramsay, MD  [facility] Pager [phone]  If 7PM-7AM, please contact night-coverage [email or web address] Password [facility] 05/25/2015, 10:31 AM   LOS: 3 days   HPI/Subjective: No events overnight.   Objective: Filed Vitals:   05/24/15 1425 05/24/15 2302 05/25/15 0500 05/25/15 0508  BP: 103/72 100/60  95/63  Pulse: 100 97  108  Temp: 98.1 F (36.7 C) 97.9 F (36.6 C)  98.3 F (36.8 C)  TempSrc: Oral Oral   Oral  Resp: '18 20  20  ' Height:      Weight:   60.5 kg (133 lb 6.1 oz)   SpO2: 96% 97%  98%    Intake/Output Summary (Last 24 hours) at 05/25/15 1031 Last data filed at 05/25/15 0900  Gross per 24 hour  Intake 2191.67 ml  Output   2250 ml  Net -58.33 ml    Exam:   General:  Pt is alert, follows commands appropriately, not in acute distress  Cardiovascular: Regular rate and rhythm, no rubs, no gallops  Respiratory: Clear to auscultation bilaterally, no wheezing, no crackles, no rhonchi  Abdomen: Soft, tender in epigastric area, bowel sounds present, NG tube in place with drainage   Extremities: pulses DP and PT palpable bilaterally  Neuro: Grossly nonfocal  Data Reviewed: Basic Metabolic Panel:  Recent Labs Lab 05/22/15 1153 05/23/15 0523 05/23/15 1410 05/24/15 0351 05/25/15 0741  NA 132* 138  --  141 143  K 3.5 3.8  --  3.7 4.3  CL 95* 107  --  111 112*  CO2 20* 21*  --  22 22  GLUCOSE 168* 127*  --  114* 110*  BUN 18 18  --  18 20  CREATININE 1.52* 1.32*  --  1.34* 1.29*  CALCIUM 8.6* 7.7*  --  7.9* 8.3*  MG  --   --  1.9  --   --    Liver Function Tests:  Recent Labs Lab 05/22/15 1153 05/23/15 0523  AST 22 15  ALT 27 21  ALKPHOS 92 75  BILITOT 0.9 0.7  PROT 6.5 5.3*  ALBUMIN 3.0* 2.2*    Recent Labs Lab 05/22/15 1153  LIPASE 57*   CBC:  Recent Labs Lab 05/22/15 1153 05/23/15 0523 05/24/15 0351 05/25/15 0741  WBC 8.4 7.4 6.2 4.8  NEUTROABS 5.9  --   --   --   HGB 13.2 11.1* 9.9* 9.2*  HCT 38.0 32.8* 29.7* 28.2*  MCV 92.2 94.0 93.7 94.6  PLT 254 251 220 177   Recent Results (from the past 240 hour(s))  Culture, Urine     Status: None   Collection Time: 05/18/15  8:38 AM  Result Value Ref Range Status   Colony Count 5,000 COLONIES/ML  Final   Organism ID, Bacteria Insignificant Growth  Final  Blood culture (routine x 2)     Status: None (Preliminary result)   Collection Time: 05/22/15 11:43 AM  Result Value Ref Range Status    Specimen Description BLOOD HAND RIGHT  Final   Special Requests BOTTLES DRAWN AEROBIC ONLY 3CC  Final   Culture NO GROWTH 2 DAYS  Final   Report Status PENDING  Incomplete  Blood culture (routine x 2)     Status: None (Preliminary result)   Collection Time: 05/22/15 12:42 PM  Result Value Ref Range Status   Specimen Description BLOOD ARM LEFT  Final   Special Requests BOTTLES DRAWN AEROBIC  AND ANAEROBIC 5CC  Final   Culture NO GROWTH 2 DAYS  Final   Report Status PENDING  Incomplete  Urine culture     Status: None   Collection Time: 05/22/15  1:21 PM  Result Value Ref Range Status   Specimen Description URINE, CLEAN CATCH  Final   Special Requests NONE  Final   Culture   Final    MULTIPLE SPECIES PRESENT, SUGGEST RECOLLECTION IF CLINICALLY INDICATED   Report Status 05/23/2015 FINAL  Final  Blood culture (routine x 2)     Status: None (Preliminary result)   Collection Time: 05/22/15  5:05 PM  Result Value Ref Range Status   Specimen Description BLOOD LEFT ANTECUBITAL  Final   Special Requests BOTTLES DRAWN AEROBIC ONLY  8 CC  Final   Culture NO GROWTH 2 DAYS  Final   Report Status PENDING  Incomplete     Scheduled Meds: . [START ON 05/26/2015] enoxaparin (LOVENOX) injection  40 mg Subcutaneous QHS  . pantoprazole (PROTONIX) IV  40 mg Intravenous Q24H   Continuous Infusions: . sodium chloride 50 mL/hr at 05/24/15 1636

## 2015-05-25 NOTE — Progress Notes (Signed)
Initial Nutrition Assessment  DOCUMENTATION CODES:  Severe malnutrition in context of chronic illness  INTERVENTION:   (RD to follow for diet advancement, supplement diet as appropriate)  NUTRITION DIAGNOSIS:  Malnutrition related to acute illness as evidenced by moderate depletion of body fat, moderate depletions of muscle mass.   GOAL:  Patient will meet greater than or equal to 90% of their needs   MONITOR:  Diet advancement, Skin, Weight trends, I & O's, Labs  REASON FOR ASSESSMENT:  Consult Assessment of nutrition requirement/status  ASSESSMENT: 64 year old female with HTN, history of breast cancer, presented to [facility] ED with main concern of several days duration of progressively worsening abd pain associated with nausea and non bloody vomiting. Current work up notable for SBO, pancreatic and liver mass and was admitted for further management.  HX obtained by pt at bedside. She reports acute weight loss and poor appetite over the past 4-6 weeks, due to nausea.   She reports UBW of 140#. She claims she has lost 11-12# over the past month, which is consistent with wt hx (8.5%).   Pt with NGT connected to low, intermittent suction. She reports she feels much better with insertion of NGT. She understands that she is unable to eat currently, however, is hungry. She reports her diet may be advanced to liquids, and she is satisfied with that plan. Discussed progression of diet once NGT is removed.   Pt reports PTA she was consuming homemade smoothies made with rice milk, protein powder, yogurt, and banana. She is amenable to supplements once diet is advanced.   Nutrition-Focused physical exam completed. Findings are moderate fat depletion, moderate muscle depletion, and no edema.    Height:  Ht Readings from Last 1 Encounters:  05/22/15 5\' 6"  (1.676 m)    Weight:  Wt Readings from Last 1 Encounters:  05/25/15 133 lb 6.1 oz (60.5 kg)    Ideal Body Weight:  59 kg  Wt  Readings from Last 10 Encounters:  05/25/15 133 lb 6.1 oz (60.5 kg)  05/21/15 132 lb (59.875 kg)  05/19/15 131 lb 12.8 oz (59.784 kg)  05/14/15 132 lb 3.2 oz (59.966 kg)  05/01/15 139 lb (63.05 kg)  09/09/14 147 lb (66.679 kg)  08/14/13 147 lb (66.679 kg)  07/31/12 145 lb (65.772 kg)  07/17/12 145 lb 1.6 oz (65.817 kg)  05/23/12 145 lb (65.772 kg)    BMI:  Body mass index is 21.54 kg/(m^2).  Estimated Nutritional Needs:  Kcal:  1600-1800  Protein:  75-85 grams  Fluid:  1.6-1.8 L  Skin:  Reviewed, no issues  Diet Order:  Diet NPO time specified Except for: Sips with Meds  EDUCATION NEEDS:  Education needs addressed   Intake/Output Summary (Last 24 hours) at 05/25/15 1651 Last data filed at 05/25/15 1100  Gross per 24 hour  Intake    325 ml  Output   1425 ml  Net  -1100 ml    Last BM:  05/20/15  Laria Grimmett A. Jimmye Norman, RD, LDN, CDE Pager: [phone] After hours Pager: [phone]

## 2015-05-25 NOTE — Sedation Documentation (Signed)
Bandaid R mid abd

## 2015-05-25 NOTE — Procedures (Signed)
Interventional Radiology Procedure Note  Procedure: US guided biopsy of left liver lobe lesion.  4 x 18g core biopsy.  Complications: None Recommendations:  - follow up biopsy result - observe 3 hours for hemodynamic change and abd pain.  - Routine care   Signed,  Dulcy Fanny. Earleen Newport, DO

## 2015-05-26 ENCOUNTER — Encounter: Payer: Self-pay | Admitting: Internal Medicine

## 2015-05-26 ENCOUNTER — Inpatient Hospital Stay (HOSPITAL_COMMUNITY): Payer: BC Managed Care – PPO

## 2015-05-26 DIAGNOSIS — E43 Unspecified severe protein-calorie malnutrition: Secondary | ICD-10-CM | POA: Insufficient documentation

## 2015-05-26 LAB — CBC
HEMATOCRIT: 27.3 % — AB (ref 36.0–46.0)
Hemoglobin: 9.2 g/dL — ABNORMAL LOW (ref 12.0–15.0)
MCH: 32.1 pg (ref 26.0–34.0)
MCHC: 33.7 g/dL (ref 30.0–36.0)
MCV: 95.1 fL (ref 78.0–100.0)
Platelets: 164 10*3/uL (ref 150–400)
RBC: 2.87 MIL/uL — ABNORMAL LOW (ref 3.87–5.11)
RDW: 15.7 % — AB (ref 11.5–15.5)
WBC: 6.2 10*3/uL (ref 4.0–10.5)

## 2015-05-26 LAB — BASIC METABOLIC PANEL
Anion gap: 8 (ref 5–15)
BUN: 21 mg/dL — AB (ref 6–20)
CALCIUM: 8.5 mg/dL — AB (ref 8.9–10.3)
CHLORIDE: 116 mmol/L — AB (ref 101–111)
CO2: 22 mmol/L (ref 22–32)
CREATININE: 1.09 mg/dL — AB (ref 0.44–1.00)
GFR calc non Af Amer: 52 mL/min — ABNORMAL LOW (ref >60)
Glucose, Bld: 115 mg/dL — ABNORMAL HIGH (ref 65–99)
Potassium: 3.8 mmol/L (ref 3.5–5.1)
Sodium: 146 mmol/L — ABNORMAL HIGH (ref 135–145)

## 2015-05-26 NOTE — Progress Notes (Addendum)
Patient ID: Dylin Ihnen, female   DOB: December 27, 1950, 64 y.o.   MRN: [id number] [facility] PROGRESS NOTE  Korynn Kenedy [id number] DOB: 02/14/1951 DOA: 05/22/2015 PCP: Lottie Dawson, MD   Brief narrative:    64 year old female with HTN, history of breast cancer, presented to [facility] ED with main concern of several days duration of progressively worsening abd pain associated with nausea and non bloody vomiting. Current work up notable for SBO, pancreatic and liver mass and was admitted for further management.   Assessment/Plan:    Active Problems:   SBO - Abdomen looks better this morning and patient reports feeling better - plan is to clamp NGT today and try sips of cleras - Appreciate surgery team following - biopsy results pending     Right hydroureteronephrosis  - secondary to a soft tissue mass in the right lower pelvis - Urologist recommend conservative management of asymptomatic hydronephrosis at this time.  - also recommend monitoring Cr and since trending down, no stenting needed at this time  - further recommendations will also depend on biopsy results  - Repeat BMP in the morning    Acute renal failure - Secondary to right hydroureteronephrosis and prerenal etiology from dehydration in the setting of SBO - IV fluids provided, creatinine continues trending down and almost WNL - Repeat BMP in the morning    Hypernatremia - Na is slightly up this AM - will see if pt tolerating sips of clears, otherwise will have to continue IVF - repeat BMP in AM    Pancreatic mass with diffuse metastatic process - IR done liver biopsy 6/20. CA 19-9 is 4179. CA 125 is 354 - biopsy results pending     Brief episode of SVT 6/18 - remains asymptomatic - resolved  - d/c telemetry     Anemia of chronic disease, ? Malignancy - anemia panel pending - drop in Hg since admission likely from IVF pt has been receiving  - Hg has however remained stable over the past 24 hours   - repeat CBC in AM    Liver mass, left lobe - follow up on biopsy results     Severe PCM - in the context of acute illness - appreciate nutritionist team assistance     Anxiety  - stable at this time     HTN - reasonable inpatient control off antihypertensives     Severe PCM - int he context of acute illness  DVT prophylaxis - Lovenox SQ  Code Status: Full.  Family Communication:  plan of care discussed with the patient Disposition Plan: Home when medically cleared, further work up needs for liver and pancreatic mass   IV access:  Peripheral IV  Procedures and diagnostic studies:    Dg Abd 1 View 05/23/2015   Persistent dilated small bowel loops, 4.9 cm in diameter, c/w SBO ? From RLQ mass  US Abdomen Complete 05/18/2015   2.7 cm complex abnormality seen in left hepatic lobe, ? Neoplasm, epigastric mass measuring 4.1 x 3.6 x 2.9 cm  Ct Abdomen Pelvis W Contrast 05/19/2015  6 cm mass in pancreatic body, ? pancreatic ca with splenic vein thrombosis. 3.5 cm mass in left hepatic lobe, ? liver met's. Right hydro  and distal SBO, d/d includes metastatic disease, or primary malignancies such as ovarian carcinoma, appendiceal carcinoma, or carcinoid tumor.   US Renal 05/18/2015   Moderate right hydronephrosis.     Dg Abd 2 Views 05/24/2015   Mild decrease in gaseous dilatation of small bowel loops  which may indicate improving small bowel obstruction.     Dg Abd Acute W/chest 05/22/2015  Dilated small bowel loops are noted consistent with distal small bowel obstruction.     Dg Loyce Dys Tube Plc W/fl W/rad 05/22/2015  Fluoroscopically guided nasogastric tube placement with tip in the stomach.   Medical Consultants:  Urology for evaluation of hydronephrosis Surgery for SBO management IR for biopsy   Other Consultants:  None   IAnti-Infectives:   None   Faye Ramsay, MD  [facility] Pager [phone]  If 7PM-7AM, please contact night-coverage [email or web address] Password  [email or web address] 05/26/2015, 12:00 PM   LOS: 4 days   HPI/Subjective: No events overnight.   Objective: Filed Vitals:   05/25/15 1502 05/25/15 2140 05/26/15 0450 05/26/15 0454  BP: 102/61 90/56  107/62  Pulse: 90 91  89  Temp: 99 F (37.2 C) 98.2 F (36.8 C)  97.7 F (36.5 C)  TempSrc: Oral Oral  Oral  Resp: '17 18  17  ' Height:      Weight:   60.011 kg (132 lb 4.8 oz)   SpO2: 99% 98%  100%    Intake/Output Summary (Last 24 hours) at 05/26/15 1200 Last data filed at 05/26/15 0744  Gross per 24 hour  Intake    450 ml  Output   1560 ml  Net  -1110 ml    Exam:   General:  Pt is alert, follows commands appropriately, not in acute distress  Cardiovascular: Regular rate and rhythm, no rubs, no gallops  Respiratory: Clear to auscultation bilaterally, no wheezing, no crackles, no rhonchi  Abdomen: Soft, non tender, bowel sounds present, NG tube in place with drainage   Extremities: pulses DP and PT palpable bilaterally  Neuro: Grossly nonfocal  Data Reviewed: Basic Metabolic Panel:  Recent Labs Lab 05/22/15 1153 05/23/15 0523 05/23/15 1410 05/24/15 0351 05/25/15 0741 05/26/15 0700  NA 132* 138  --  141 143 146*  K 3.5 3.8  --  3.7 4.3 3.8  CL 95* 107  --  111 112* 116*  CO2 20* 21*  --  '22 22 22  ' GLUCOSE 168* 127*  --  114* 110* 115*  BUN 18 18  --  18 20 21*  CREATININE 1.52* 1.32*  --  1.34* 1.29* 1.09*  CALCIUM 8.6* 7.7*  --  7.9* 8.3* 8.5*  MG  --   --  1.9  --   --   --    Liver Function Tests:  Recent Labs Lab 05/22/15 1153 05/23/15 0523  AST 22 15  ALT 27 21  ALKPHOS 92 75  BILITOT 0.9 0.7  PROT 6.5 5.3*  ALBUMIN 3.0* 2.2*    Recent Labs Lab 05/22/15 1153  LIPASE 57*   CBC:  Recent Labs Lab 05/22/15 1153 05/23/15 0523 05/24/15 0351 05/25/15 0741 05/26/15 0700  WBC 8.4 7.4 6.2 4.8 6.2  NEUTROABS 5.9  --   --   --   --   HGB 13.2 11.1* 9.9* 9.2* 9.2*  HCT 38.0 32.8* 29.7* 28.2* 27.3*  MCV 92.2 94.0 93.7 94.6 95.1  PLT 254 251 220 177  164   Recent Results (from the past 240 hour(s))  Culture, Urine     Status: None   Collection Time: 05/18/15  8:38 AM  Result Value Ref Range Status   Colony Count 5,000 COLONIES/ML  Final   Organism ID, Bacteria Insignificant Growth  Final  Blood culture (routine x 2)     Status: None (Preliminary result)   Collection  Time: 05/22/15 11:43 AM  Result Value Ref Range Status   Specimen Description BLOOD HAND RIGHT  Final   Special Requests BOTTLES DRAWN AEROBIC ONLY 3CC  Final   Culture NO GROWTH 3 DAYS  Final   Report Status PENDING  Incomplete  Blood culture (routine x 2)     Status: None (Preliminary result)   Collection Time: 05/22/15 12:42 PM  Result Value Ref Range Status   Specimen Description BLOOD ARM LEFT  Final   Special Requests BOTTLES DRAWN AEROBIC AND ANAEROBIC 5CC  Final   Culture NO GROWTH 3 DAYS  Final   Report Status PENDING  Incomplete  Urine culture     Status: None   Collection Time: 05/22/15  1:21 PM  Result Value Ref Range Status   Specimen Description URINE, CLEAN CATCH  Final   Special Requests NONE  Final   Culture   Final    MULTIPLE SPECIES PRESENT, SUGGEST RECOLLECTION IF CLINICALLY INDICATED   Report Status 05/23/2015 FINAL  Final  Blood culture (routine x 2)     Status: None (Preliminary result)   Collection Time: 05/22/15  5:05 PM  Result Value Ref Range Status   Specimen Description BLOOD LEFT ANTECUBITAL  Final   Special Requests BOTTLES DRAWN AEROBIC ONLY  8 CC  Final   Culture NO GROWTH 3 DAYS  Final   Report Status PENDING  Incomplete     Scheduled Meds: . enoxaparin (LOVENOX) injection  40 mg Subcutaneous QHS  . pantoprazole (PROTONIX) IV  40 mg Intravenous Q24H   Continuous Infusions: . sodium chloride 50 mL/hr at 05/25/15 2147

## 2015-05-26 NOTE — Progress Notes (Signed)
RN noticed patient has had a mastectomy in the past to the right arm- IV is located in the right arm. Patient states that she does not want the site changed because she has been told before that it is ok to place an IV in that arm. Patient states that the surgery was over 15 years ago. IV has been consulted. Any necessary actions will be taken.

## 2015-05-26 NOTE — Progress Notes (Signed)
Subjective: Feels better. Had a bm last night  Objective: Vital signs in last 24 hours: Temp:  [97.7 F (36.5 C)-99 F (37.2 C)] 97.7 F (36.5 C) (06/21 0454) Pulse Rate:  [89-96] 89 (06/21 0454) Resp:  [13-18] 17 (06/21 0454) BP: (90-111)/(56-66) 107/62 mmHg (06/21 0454) SpO2:  [92 %-100 %] 100 % (06/21 0454) Weight:  [60.011 kg (132 lb 4.8 oz)] 60.011 kg (132 lb 4.8 oz) (06/21 0450) Last BM Date: 05/25/15  Intake/Output from previous day: 06/20 0701 - 06/21 0700 In: 450 [I.V.:450] Out: 1535 [Urine:185; Emesis/NG output:1350] Intake/Output this shift: Total I/O In: -  Out: 100 [Emesis/NG output:100]  Resp: clear to auscultation bilaterally Cardio: regular rate and rhythm GI: soft, nontender  Lab Results:   Recent Labs  05/25/15 0741 05/26/15 0700  WBC 4.8 6.2  HGB 9.2* 9.2*  HCT 28.2* 27.3*  PLT 177 164   BMET  Recent Labs  05/25/15 0741 05/26/15 0700  NA 143 146*  K 4.3 3.8  CL 112* 116*  CO2 22 22  GLUCOSE 110* 115*  BUN 20 21*  CREATININE 1.29* 1.09*  CALCIUM 8.3* 8.5*   PT/INR  Recent Labs  05/24/15 0351  LABPROT 16.5*  INR 1.32   ABG No results for input(s): PHART, HCO3 in the last 72 hours.  Invalid input(s): PCO2, PO2  Studies/Results: US Biopsy  05/25/2015   INDICATION: 64 year old female with a new imaging diagnosis of pancreatic mass and liver mass, referred for image guided biopsy.  EXAM: ULTRASOUND GUIDED RENAL BIOPSY  COMPARISON:  CT abdomen 05/19/2015  MEDICATIONS: Fentanyl 2.0 mcg IV; Versed 50 mg IV  ANESTHESIA/SEDATION: Total Moderate Sedation time  Twenty minutes  COMPLICATIONS: None  PROCEDURE: Informed written consent was obtained from the patient after a discussion of the risks, benefits and alternatives to treatment. The patient understands and consents the procedure. A timeout was performed prior to the initiation of the procedure.  Ultrasound survey of the epigastric region was performed, with images of the left liver  lobe stored and sent to PACs.  The epigastric region was then prepped and draped in the usual sterile fashion.  Using sterile technique, the skin and subcutaneous tissues were generously infiltrated 1% lidocaine for local anesthesia. The musculature was also infiltrated with 1% lidocaine to the level of the liver capsule.  Using ultrasound, a guide needle was advanced into the heterogeneously echoic lesion of the left liver lobe. After we confirmed position of the needle tip with ultrasound, 4 separate 18 gauge core biopsy were retrieved.  Specimen were placed into formalin for transportation to the lab.  Gel-Foam pledgets were then infused with a small amount of saline.  The patient tolerated the procedure well and remained hemodynamically stable throughout.  No complications were encountered and no significant blood loss was encountered.  FINDINGS: Ultrasound survey and images during the case demonstrate heterogeneous lesion within the left liver lobe. Needle tip was observed within the lesion on each pass.  Gas was present on the final image after infusion of Gel-Foam pledgets, with no complicating features identified.  IMPRESSION: Status post ultrasound-guided biopsy of left liver lobe mass, with tissue specimen sent to pathology for complete histopathologic analysis.  Signed,  Dulcy Fanny. Earleen Newport, DO  [facility]  [facility]   Electronically Signed   By: Corrie Mckusick D.O.   On: 05/25/2015 16:27   Dg Abd 2 Views  05/26/2015   CLINICAL DATA:  History of small bowel obstruction, NG tube placement  EXAM:  ABDOMEN - 2 VIEW  COMPARISON:  Abdomen films of 05/24/2015  FINDINGS: There are persistently dilated small bowel loops most consistent with partial small bowel obstruction with scattered air-fluid levels. No significant colonic bowel gas is seen with a small amount contrast within the nondistended descending and rectosigmoid colon. No free air is seen on the erect  view. An NG tube is present with the tip coiling in the antrum of the stomach.  IMPRESSION: 1. Persistently dilated loops of small bowel consistent with partial small bowel obstruction. 2. NG tube coils in the antrum of the stomach.   Electronically Signed   By: Ivar Drape M.D.   On: 05/26/2015 07:58    Anti-infectives: Anti-infectives    None      Assessment/Plan: s/p * No surgery found * will clamp ng today and allow sips of clears  Await bx results  LOS: 4 days    TOTH III,Kerry Chisolm S 05/26/2015

## 2015-05-26 NOTE — Progress Notes (Signed)
PT Cancellation Note  Patient Details Name: Kairie Vangieson MRN: [id number] DOB: 08-Feb-1951   Cancelled Treatment:    Reason Eval/Treat Not Completed: PT screened, no needs identified, will sign off. Spoke to patient who states she has been up walking the halls and feels she is mobilizing at her baseline.  Will sign off.   Roan Miklos LUBECK 05/26/2015, 11:08 AM

## 2015-05-26 NOTE — Progress Notes (Signed)
Utilization review completed.  

## 2015-05-27 ENCOUNTER — Inpatient Hospital Stay (HOSPITAL_COMMUNITY): Payer: BC Managed Care – PPO

## 2015-05-27 LAB — CULTURE, BLOOD (ROUTINE X 2)
CULTURE: NO GROWTH
Culture: NO GROWTH
Culture: NO GROWTH

## 2015-05-27 LAB — BASIC METABOLIC PANEL
ANION GAP: 10 (ref 5–15)
BUN: 17 mg/dL (ref 6–20)
CO2: 22 mmol/L (ref 22–32)
CREATININE: 1.17 mg/dL — AB (ref 0.44–1.00)
Calcium: 8.1 mg/dL — ABNORMAL LOW (ref 8.9–10.3)
Chloride: 107 mmol/L (ref 101–111)
GFR calc Af Amer: 55 mL/min — ABNORMAL LOW (ref >60)
GFR calc non Af Amer: 48 mL/min — ABNORMAL LOW (ref >60)
Glucose, Bld: 118 mg/dL — ABNORMAL HIGH (ref 65–99)
POTASSIUM: 3.4 mmol/L — AB (ref 3.5–5.1)
Sodium: 139 mmol/L (ref 135–145)

## 2015-05-27 LAB — CBC
HCT: 26.9 % — ABNORMAL LOW (ref 36.0–46.0)
Hemoglobin: 9.3 g/dL — ABNORMAL LOW (ref 12.0–15.0)
MCH: 32 pg (ref 26.0–34.0)
MCHC: 34.6 g/dL (ref 30.0–36.0)
MCV: 92.4 fL (ref 78.0–100.0)
Platelets: 215 10*3/uL (ref 150–400)
RBC: 2.91 MIL/uL — ABNORMAL LOW (ref 3.87–5.11)
RDW: 15.6 % — AB (ref 11.5–15.5)
WBC: 6.4 10*3/uL (ref 4.0–10.5)

## 2015-05-27 LAB — GLUCOSE, CAPILLARY
Glucose-Capillary: 133 mg/dL — ABNORMAL HIGH (ref 65–99)
Glucose-Capillary: 164 mg/dL — ABNORMAL HIGH (ref 65–99)
Glucose-Capillary: 99 mg/dL (ref 65–99)

## 2015-05-27 MED ORDER — POTASSIUM CHLORIDE 10 MEQ/50ML IV SOLN
10.0000 meq | INTRAVENOUS | Status: AC
  Administered 2015-05-27 (×2): 10 meq via INTRAVENOUS
  Filled 2015-05-27 (×2): qty 50

## 2015-05-27 MED ORDER — TRACE MINERALS CR-CU-F-FE-I-MN-MO-SE-ZN IV SOLN
INTRAVENOUS | Status: AC
  Administered 2015-05-27: 18:00:00 via INTRAVENOUS
  Filled 2015-05-27: qty 960

## 2015-05-27 MED ORDER — LIDOCAINE HCL 2 % EX GEL
1.0000 "application " | Freq: Once | CUTANEOUS | Status: AC
  Administered 2015-05-27: 1 via TOPICAL
  Filled 2015-05-27: qty 5

## 2015-05-27 MED ORDER — SODIUM CHLORIDE 0.9 % IJ SOLN
10.0000 mL | INTRAMUSCULAR | Status: DC | PRN
  Administered 2015-05-29 – 2015-06-04 (×4): 10 mL
  Administered 2015-06-04 (×3): 20 mL
  Administered 2015-06-05 – 2015-06-07 (×4): 10 mL
  Administered 2015-06-07 – 2015-06-08 (×3): 20 mL
  Filled 2015-05-27 (×14): qty 40

## 2015-05-27 MED ORDER — FAT EMULSION 20 % IV EMUL
240.0000 mL | INTRAVENOUS | Status: AC
  Administered 2015-05-27: 240 mL via INTRAVENOUS
  Filled 2015-05-27: qty 250

## 2015-05-27 MED ORDER — SODIUM CHLORIDE 0.45 % IV SOLN
INTRAVENOUS | Status: DC
  Administered 2015-05-27: 19:00:00 via INTRAVENOUS
  Filled 2015-05-27: qty 1000

## 2015-05-27 MED ORDER — DEXTROSE 5 % IV SOLN: 2.0000 g | INTRAVENOUS | Status: DC

## 2015-05-27 MED ORDER — SODIUM CHLORIDE 0.45 % IV SOLN
INTRAVENOUS | Status: AC
  Administered 2015-05-27: 18:00:00 via INTRAVENOUS
  Filled 2015-05-27 (×2): qty 1000

## 2015-05-27 MED ORDER — INSULIN ASPART 100 UNIT/ML ~~LOC~~ SOLN
0.0000 [IU] | SUBCUTANEOUS | Status: DC
  Administered 2015-05-27 – 2015-05-28 (×2): 1 [IU] via SUBCUTANEOUS
  Administered 2015-05-28: 2 [IU] via SUBCUTANEOUS
  Administered 2015-05-28: 3 [IU] via SUBCUTANEOUS
  Administered 2015-05-29: 2 [IU] via SUBCUTANEOUS
  Administered 2015-05-29: 3 [IU] via SUBCUTANEOUS
  Administered 2015-05-29: 1 [IU] via SUBCUTANEOUS
  Administered 2015-05-29: 2 [IU] via SUBCUTANEOUS
  Administered 2015-05-29: 3 [IU] via SUBCUTANEOUS
  Administered 2015-05-29 – 2015-05-30 (×7): 2 [IU] via SUBCUTANEOUS
  Administered 2015-05-31: 3 [IU] via SUBCUTANEOUS
  Administered 2015-05-31 (×2): 2 [IU] via SUBCUTANEOUS
  Administered 2015-05-31: 3 [IU] via SUBCUTANEOUS
  Administered 2015-05-31 – 2015-06-03 (×19): 2 [IU] via SUBCUTANEOUS
  Administered 2015-06-03: 1 [IU] via SUBCUTANEOUS
  Administered 2015-06-04 (×2): 2 [IU] via SUBCUTANEOUS
  Administered 2015-06-04: 1 [IU] via SUBCUTANEOUS
  Administered 2015-06-04 (×2): 2 [IU] via SUBCUTANEOUS
  Administered 2015-06-04: 1 [IU] via SUBCUTANEOUS
  Administered 2015-06-04: 2 [IU] via SUBCUTANEOUS
  Administered 2015-06-05 (×3): 1 [IU] via SUBCUTANEOUS

## 2015-05-27 NOTE — Progress Notes (Addendum)
NCM spoke with patient to see if could speak with [name] over the phone, patient states yes it is ok to speak with her.  NCM called [name] and she wanted to know if patient may need [facility] services after surgery or Hospice, NCM informed her that we will have to see how patient is after surgery to make a better decision, but left home health agency list in room for [facility].

## 2015-05-27 NOTE — Progress Notes (Signed)
Patient ID: Ashley Hurley, female   DOB: 1951-11-01, 64 y.o.   MRN: [id number] [facility] PROGRESS NOTE  Ashley Hurley FBP:[id number] DOB: [birth year] DOA: 05/22/2015 PCP: Lottie Dawson, MD   Brief narrative:    64 year old female with HTN, history of breast cancer, presented to [facility] ED with main concern of several days duration of progressively worsening abd pain associated with nausea and non bloody vomiting. Current work up notable for SBO, pancreatic and liver mass and was admitted for further management.   Assessment/Plan:    Active Problems:   SBO - NGT was taken out last night but pt with more vomiting this AM - d/w surgery, possibly take to OR tomorrow - Appreciate surgery team following - biopsy results positive for adenocarcinoma (pancreatobiliary)     Right hydroureteronephrosis  - secondary to a soft tissue mass in the right lower pelvis - Urologist recommend conservative management of asymptomatic hydronephrosis at this time.  - since Cr trending down, no stenting needed at this time  - Repeat BMP in the morning    Acute renal failure - Secondary to right hydroureteronephrosis and prerenal etiology from dehydration in the setting of SBO - IV fluids provided, creatinine continues trending down  - Repeat BMP in the morning    Hypokalemia - supplement in IVF and repeat BMP in AM    Hypernatremia - Na is WNL this AM     Pancreatic mass with diffuse metastatic process - IR done liver biopsy 6/20. CA 19-9 is 4179. 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 Earleen Newport)    Brief episode of SVT 6/18 - remains asymptomatic - resolved  - d/c telemetry 6/21    Anemia of chronic disease, ? Malignancy - anemia panel pending - Hg has remained stable over the past 24 hours  - repeat CBC in AM    Liver mass, left lobe - biopsy results noted  above     Severe PCM - in the context of acute illness - appreciate nutritionist team assistance     Anxiety  - stable at this time     HTN - reasonable inpatient control off antihypertensives     Severe PCM - in  the context of acute illness  DVT prophylaxis - Lovenox SQ  Code Status: Full.  Family Communication:  plan of care discussed with the patient Disposition Plan: Home when medically cleared  IV access:  Peripheral IV  Procedures and diagnostic studies:    Dg Abd 1 View 05/23/2015   Persistent dilated small bowel loops, 4.9 cm in diameter, c/w SBO ? From RLQ mass  US Abdomen Complete 05/18/2015   2.7 cm complex abnormality seen in left hepatic lobe, ? Neoplasm, epigastric mass measuring 4.1 x 3.6 x 2.9 cm  Ct Abdomen Pelvis W Contrast 05/19/2015  6 cm mass in pancreatic body, ? pancreatic ca with splenic vein thrombosis. 3.5 cm mass in left hepatic lobe, ? liver met's. Right hydro  and distal SBO, d/d includes metastatic disease, or primary malignancies such as ovarian carcinoma, appendiceal carcinoma, or carcinoid tumor.   US Renal 05/18/2015   Moderate right hydronephrosis.     Dg Abd 2 Views 05/24/2015   Mild decrease in gaseous dilatation of small bowel loops which may indicate improving small bowel obstruction.     Dg Abd Acute W/chest 05/22/2015  Dilated small bowel loops are noted consistent with distal small bowel  obstruction.     Dg Loyce Dys Tube Plc W/fl W/rad 05/22/2015  Fluoroscopically guided nasogastric tube placement with tip in the stomach.   Medical Consultants:  Urology for evaluation of hydronephrosis Surgery for SBO management IR for biopsy   Other Consultants:  None   IAnti-Infectives:   None   Faye Ramsay, MD  [facility] Pager [phone]  If 7PM-7AM, please contact night-coverage [email or web address] Password [email or web address] 05/27/2015, 11:09 AM   LOS: 5 days   HPI/Subjective: No events overnight.   Objective: Filed Vitals:   05/26/15 1400  05/26/15 2100 05/27/15 0046 05/27/15 0606  BP: 101/59 101/62  95/66  Pulse: 91 80  114  Temp: 98.9 F (37.2 C) 99.1 F (37.3 C) 98.3 F (36.8 C) 98.2 F (36.8 C)  TempSrc: Oral Oral Oral Oral  Resp: '18 18  16  ' Height:      Weight:    62.6 kg (138 lb 0.1 oz)  SpO2: 100% 100%  97%    Intake/Output Summary (Last 24 hours) at 05/27/15 1109 Last data filed at 05/26/15 1350  Gross per 24 hour  Intake    600 ml  Output      0 ml  Net    600 ml    Exam:   General:  Pt is alert, follows commands appropriately, not in acute distress  Cardiovascular: Regular rate and rhythm, no rubs, no gallops  Respiratory: Clear to auscultation bilaterally, no wheezing, no crackles, no rhonchi  Abdomen: Soft, distended, mild tenderness in epigastric area  Extremities: pulses DP and PT palpable bilaterally  Neuro: Grossly nonfocal  Data Reviewed: Basic Metabolic Panel:  Recent Labs Lab 05/23/15 0523 05/23/15 1410 05/24/15 0351 05/25/15 0741 05/26/15 0700 05/27/15 0538  NA 138  --  141 143 146* 139  K 3.8  --  3.7 4.3 3.8 3.4*  CL 107  --  111 112* 116* 107  CO2 21*  --  '22 22 22 22  ' GLUCOSE 127*  --  114* 110* 115* 118*  BUN 18  --  18 20 21* 17  CREATININE 1.32*  --  1.34* 1.29* 1.09* 1.17*  CALCIUM 7.7*  --  7.9* 8.3* 8.5* 8.1*  MG  --  1.9  --   --   --   --    Liver Function Tests:  Recent Labs Lab 05/22/15 1153 05/23/15 0523  AST 22 15  ALT 27 21  ALKPHOS 92 75  BILITOT 0.9 0.7  PROT 6.5 5.3*  ALBUMIN 3.0* 2.2*    Recent Labs Lab 05/22/15 1153  LIPASE 57*   CBC:  Recent Labs Lab 05/22/15 1153 05/23/15 0523 05/24/15 0351 05/25/15 0741 05/26/15 0700 05/27/15 0538  WBC 8.4 7.4 6.2 4.8 6.2 6.4  NEUTROABS 5.9  --   --   --   --   --   HGB 13.2 11.1* 9.9* 9.2* 9.2* 9.3*  HCT 38.0 32.8* 29.7* 28.2* 27.3* 26.9*  MCV 92.2 94.0 93.7 94.6 95.1 92.4  PLT 254 251 220 177 164 215   Recent Results (from the past 240 hour(s))  Culture, Urine     Status: None    Collection Time: 05/18/15  8:38 AM  Result Value Ref Range Status   Colony Count 5,000 COLONIES/ML  Final   Organism ID, Bacteria Insignificant Growth  Final  Blood culture (routine x 2)     Status: None (Preliminary result)   Collection Time: 05/22/15 11:43 AM  Result Value Ref Range Status   Specimen Description BLOOD HAND  RIGHT  Final   Special Requests BOTTLES DRAWN AEROBIC ONLY 3CC  Final   Culture NO GROWTH 4 DAYS  Final   Report Status PENDING  Incomplete  Blood culture (routine x 2)     Status: None (Preliminary result)   Collection Time: 05/22/15 12:42 PM  Result Value Ref Range Status   Specimen Description BLOOD ARM LEFT  Final   Special Requests BOTTLES DRAWN AEROBIC AND ANAEROBIC 5CC  Final   Culture NO GROWTH 4 DAYS  Final   Report Status PENDING  Incomplete  Urine culture     Status: None   Collection Time: 05/22/15  1:21 PM  Result Value Ref Range Status   Specimen Description URINE, CLEAN CATCH  Final   Special Requests NONE  Final   Culture   Final    MULTIPLE SPECIES PRESENT, SUGGEST RECOLLECTION IF CLINICALLY INDICATED   Report Status 05/23/2015 FINAL  Final  Blood culture (routine x 2)     Status: None (Preliminary result)   Collection Time: 05/22/15  5:05 PM  Result Value Ref Range Status   Specimen Description BLOOD LEFT ANTECUBITAL  Final   Special Requests BOTTLES DRAWN AEROBIC ONLY  8 CC  Final   Culture NO GROWTH 4 DAYS  Final   Report Status PENDING  Incomplete     Scheduled Meds: . enoxaparin (LOVENOX) injection  40 mg Subcutaneous QHS  . lidocaine  1 application Topical Once  . pantoprazole (PROTONIX) IV  40 mg Intravenous Q24H   Continuous Infusions: . sodium chloride 50 mL/hr at 05/26/15 1804

## 2015-05-27 NOTE — Progress Notes (Signed)
PARENTERAL NUTRITION CONSULT NOTE - INITIAL  Pharmacy Consult for TPN Indication: SBO, expected prolonged ileus  Allergies  Allergen Reactions  . Aspirin     Patient Measurements: Height: '5\' 6"'  (167.6 cm) Weight: 138 lb 0.1 oz (62.6 kg) IBW/kg (Calculated) : 59.3  Vital Signs: Temp: 98.2 F (36.8 C) (06/22 0606) Temp Source: Oral (06/22 0606) BP: 95/66 mmHg (06/22 0606) Pulse Rate: 114 (06/22 0606) Intake/Output from previous day: 06/21 0701 - 06/22 0700 In: 600 [P.O.:600] Out: 100 [Emesis/NG output:100] Intake/Output from this shift:    Labs:  Recent Labs  05/25/15 0741 05/26/15 0700 05/27/15 0538  WBC 4.8 6.2 6.4  HGB 9.2* 9.2* 9.3*  HCT 28.2* 27.3* 26.9*  PLT 177 164 215     Recent Labs  05/25/15 0741 05/26/15 0700 05/27/15 0538  NA 143 146* 139  K 4.3 3.8 3.4*  CL 112* 116* 107  CO2 '22 22 22  ' GLUCOSE 110* 115* 118*  BUN 20 21* 17  CREATININE 1.29* 1.09* 1.17*  CALCIUM 8.3* 8.5* 8.1*   Estimated Creatinine Clearance: 44.9 mL/min (by C-G formula based on Cr of 1.17).   No results for input(s): GLUCAP in the last 72 hours.  Medical History: Past Medical History  Diagnosis Date  . Cancer     breast  . Hypertension   . Hyperlipidemia   . Allergy   . Chocolate cyst of ovary   . Breast cancer, right breast Jan 2001    [id number] radiation and chemo granfortuna    Insulin Requirements in the past 24 hours:  No insulin ordered  Assessment: 68 yof with presenting 05/22/15 with worsening N/V and abdominal pain. Duration of symptoms 2 months and has lost 11-12 lb in last month. Nutrition notes patient with moderate depletion in body fat, depletions in muscle mass. Patient found to have SBO likely due to pelvic mass - anticipated prolonged ileus post-op and patient has been NPO x ~5 days inpatient. Pharmacy consulted to initiate TPN for SBO and anticipated prolonged ileus post-op.  Surgeries/Procedures: 6/22: to OR today  GI: SBO likely due to  pelvic mass - anticipated prolonged ileus post-op. Prealbumin 9 on admit. Did not tolerate sips of clears on 6/21. Throwing up bile and having BMs on 6/22. Failed non-operative management - NGT clamped on 6/21, to be replaced today with more vomiting (728m/24h). To OR on 6/22. PPI, zofran prn  Endo: No DM hx. BG stable 118 prior to TPN initiation (no CBGs)  Lytes: K low 3.4 - no supplementation (goal >/=4 with ileus), Mg 1.9 on 6/18 (goal >/=2 with ileus), coCa ok 9.4, other lytes wnl  Renal: SCr stable 1.17, CrCl~45, I/Os innaccurate. 1/2NS+40K'@50'  ordered  Cards: HTN, HLD  Hepatobil: LFTs/alk phos/tbili wnl on 6/18  Neuro: Anxiety. hydromorphone prn, lorazepam prn  Heme/Onc: Concerning pancreatic mass with liver mets. Hx breast cancer s/p mastectomy. Awaiting biopsy results from 6/20  ID: Afebrile, wbc wnl, no antibiotics 6/17 BCx4>>ngtd  Best Practices: enox40 TPN Access: PICC placed 05/22/15 TPN start date: 05/27/15>>  Current Nutrition:  NPO (clears to be d/c'd per MD)  Nutritional Goals:  1600-1800 kCal, 75-85 grams of protein per day (per Nutrition assessment)  Plan: - Initiate Clinimix E 5/15 '@40'  ml/h (goal rate 70 ml/h) + 20% IVFE '@10'  ml/h - will advance to goal as tolerated. (TPN currently will provide 48g protein + 1162 kcal) - Multivitamin and trace elements daily - Start sensitive SSI and monitor CBGs - K runs x2 (patient will get K in IVF  also) - Decrease IVF to [facility] at 1800 when TPN initiated - F/u TPN labs   Elicia Lamp, PharmD Clinical Pharmacist - Resident Pager [phone] 05/27/2015 12:02 PM

## 2015-05-27 NOTE — Progress Notes (Signed)
Nutrition Follow-up  DOCUMENTATION CODES:  Severe malnutrition in context of chronic illness  INTERVENTION:  TPN  NUTRITION DIAGNOSIS:  Malnutrition related to acute illness as evidenced by moderate depletion of body fat, moderate depletions of muscle mass.  Ongoing  GOAL:  Patient will meet greater than or equal to 90% of their needs  Unmet  MONITOR:  Diet advancement, Skin, Weight trends, I & O's, Labs  REASON FOR ASSESSMENT:  Consult New TPN/TNA  ASSESSMENT: 64 year old female with HTN, history of breast cancer, presented to [facility] ED with main concern of several days duration of progressively worsening abd pain associated with nausea and non bloody vomiting. Current work up notable for SBO, pancreatic and liver mass and was admitted for further management.  Chart reviewed. Full assessment completed on 05/25/15.   NGT was removed and pt was advanced to clear liquid diet. However, pt was unable to tolerate liquids due to vomiting. Plan is now to start TPN.   Per MD notes, biopsy revealed pancreatobiliary adenocarcinoma. Plan is for pt to undergo ex lap with potential for palliative bypass or G-tube for palliation.   Per pharmacy note, plan is to initiate Clinimix E 5/15 @40  ml/h (goal rate 70 ml/h) + 20% IVFE @10  ml/h - will advance to goal as tolerated. (TPN currently will provide 48g protein + 1162 kcal, which meets 66% of estimated kcal needs and 53% of estimated protein needs). Noted orders for PICC placement.  Labs reviewed: K: 3.4.  Height:  Ht Readings from Last 1 Encounters:  05/22/15 5\' 6"  (1.676 m)    Weight:  Wt Readings from Last 1 Encounters:  05/27/15 138 lb 0.1 oz (62.6 kg)    Ideal Body Weight:  59 kg  Wt Readings from Last 10 Encounters:  05/27/15 138 lb 0.1 oz (62.6 kg)  05/21/15 132 lb (59.875 kg)  05/19/15 131 lb 12.8 oz (59.784 kg)  05/14/15 132 lb 3.2 oz (59.966 kg)  05/01/15 139 lb (63.05 kg)  09/09/14 147 lb (66.679 kg)  08/14/13  147 lb (66.679 kg)  07/31/12 145 lb (65.772 kg)  07/17/12 145 lb 1.6 oz (65.817 kg)  05/23/12 145 lb (65.772 kg)    BMI:  Body mass index is 22.29 kg/(m^2).  Estimated Nutritional Needs:  Kcal:  8127-5170  Protein:  90-100 grams  Fluid:  1.7-1.9 L  Skin:  Reviewed, no issues  Diet Order:  Diet NPO time specified TPN (CLINIMIX-E) Adult  EDUCATION NEEDS:  Education needs addressed  No intake or output data in the 24 hours ending 05/27/15 1558  Last BM:  05/26/15  Dennis Hegeman A. Jimmye Norman, RD, LDN, CDE Pager: [phone] After hours Pager: [phone]

## 2015-05-27 NOTE — Progress Notes (Signed)
RN spoke with IV team- encouraged IV site to be changed but if patient is resistant- there should be no harm done. Patient does not want site change at this time.

## 2015-05-27 NOTE — Progress Notes (Signed)
Peripherally Inserted Central Catheter/Midline Placement  The IV Nurse has discussed with the patient and/or persons authorized to consent for the patient, the purpose of this procedure and the potential benefits and risks involved with this procedure.  The benefits include less needle sticks, lab draws from the catheter and patient may be discharged home with the catheter.  Risks include, but not limited to, infection, bleeding, blood clot (thrombus formation), and puncture of an artery; nerve damage and irregular heat beat.  Alternatives to this procedure were also discussed.  PICC/Midline Placement Documentation        Ashley Hurley 05/27/2015, 3:22 PM

## 2015-05-27 NOTE — Progress Notes (Signed)
Patient ID: Ashley Hurley, female   DOB: Oct 23, 1951, 64 y.o.   MRN: [id number]     [facility]      [address] [place], [place] [address]    Phone: [phone] FAX: [phone]     Subjective: Throwing up bile.  Having BMs.   Objective:  Vital signs:  Filed Vitals:   05/26/15 1400 05/26/15 2100 05/27/15 0046 05/27/15 0606  BP: 101/59 101/62  95/66  Pulse: 91 80  114  Temp: 98.9 F (37.2 C) 99.1 F (37.3 C) 98.3 F (36.8 C) 98.2 F (36.8 C)  TempSrc: Oral Oral Oral Oral  Resp: _0 Height:      Weight:    62.6 kg (138 lb 0.1 oz)  SpO2: 100% 100%  97%    Last BM Date: 05/25/15  Intake/Output   Yesterday:  06/21 0701 - 06/22 0700 In: 600 [P.O.:600] Out: 100 [Emesis/NG output:100] This shift: I/O last 3 completed shifts: In: 1050 [P.O.:600; I.V.:450] Out: 11 [Urine:110; Emesis/NG output:700]    Physical Exam: General: Pt awake/alert/oriented x4 in no acute distress  Abdomen: Soft.  distended.  Non tender.  No evidence of peritonitis.  No incarcerated hernias.    Problem List:   Active Problems:   Liver mass, left lobe   Pancreatic mass   SBO (small bowel obstruction)   Abdominal pain   Protein-calorie malnutrition, severe    Results:   Labs: Results for orders placed or performed during the hospital encounter of 05/22/15 (from the past 48 hour(s))  CBC     Status: Abnormal   Collection Time: 05/26/15  7:00 AM  Result Value Ref Range   WBC 6.2 4.0 - 10.5 K/uL   RBC 2.87 (L) 3.87 - 5.11 MIL/uL   Hemoglobin 9.2 (L) 12.0 - 15.0 g/dL   HCT 27.3 (L) 36.0 - 46.0 %   MCV 95.1 78.0 - 100.0 fL   MCH 32.1 26.0 - 34.0 pg   MCHC 33.7 30.0 - 36.0 g/dL   RDW 15.7 (H) 11.5 - 15.5 %   Platelets 164 150 - 400 K/uL  Basic metabolic panel     Status: Abnormal   Collection Time: 05/26/15  7:00 AM  Result Value Ref Range   Sodium 146 (H) 135 - 145 mmol/L   Potassium 3.8 3.5 - 5.1 mmol/L   Chloride  116 (H) 101 - 111 mmol/L   CO2 22 22 - 32 mmol/L   Glucose, Bld 115 (H) 65 - 99 mg/dL   BUN 21 (H) 6 - 20 mg/dL   Creatinine, Ser 1.09 (H) 0.44 - 1.00 mg/dL   Calcium 8.5 (L) 8.9 - 10.3 mg/dL   GFR calc non Af Amer 52 (L) >60 mL/min   GFR calc Af Amer >60 >60 mL/min    Comment: (NOTE) The eGFR has been calculated using the CKD EPI equation. This calculation has not been validated in all clinical situations. eGFR's persistently <60 mL/min signify possible Chronic Kidney Disease.    Anion gap 8 5 - 15  CBC     Status: Abnormal   Collection Time: 05/27/15  5:38 AM  Result Value Ref Range   WBC 6.4 4.0 - 10.5 K/uL   RBC 2.91 (L) 3.87 - 5.11 MIL/uL   Hemoglobin 9.3 (L) 12.0 - 15.0 g/dL   HCT 26.9 (L) 36.0 - 46.0 %   MCV 92.4 78.0 - 100.0 fL   MCH 32.0 26.0 - 34.0 pg  MCHC 34.6 30.0 - 36.0 g/dL   RDW 15.6 (H) 11.5 - 15.5 %   Platelets 215 150 - 400 K/uL  Basic metabolic panel     Status: Abnormal   Collection Time: 05/27/15  5:38 AM  Result Value Ref Range   Sodium 139 135 - 145 mmol/L    Comment: DELTA CHECK NOTED   Potassium 3.4 (L) 3.5 - 5.1 mmol/L   Chloride 107 101 - 111 mmol/L   CO2 22 22 - 32 mmol/L   Glucose, Bld 118 (H) 65 - 99 mg/dL   BUN 17 6 - 20 mg/dL   Creatinine, Ser 1.17 (H) 0.44 - 1.00 mg/dL   Calcium 8.1 (L) 8.9 - 10.3 mg/dL   GFR calc non Af Amer 48 (L) >60 mL/min   GFR calc Af Amer 55 (L) >60 mL/min    Comment: (NOTE) The eGFR has been calculated using the CKD EPI equation. This calculation has not been validated in all clinical situations. eGFR's persistently <60 mL/min signify possible Chronic Kidney Disease.    Anion gap 10 5 - 15    Imaging / Studies: US Biopsy  05/25/2015   INDICATION: 64 year old female with a new imaging diagnosis of pancreatic mass and liver mass, referred for image guided biopsy.  EXAM: ULTRASOUND GUIDED RENAL BIOPSY  COMPARISON:  CT abdomen 05/19/2015  MEDICATIONS: Fentanyl 2.0 mcg IV; Versed 50 mg IV  ANESTHESIA/SEDATION:  Total Moderate Sedation time  Twenty minutes  COMPLICATIONS: None  PROCEDURE: Informed written consent was obtained from the patient after a discussion of the risks, benefits and alternatives to treatment. The patient understands and consents the procedure. A timeout was performed prior to the initiation of the procedure.  Ultrasound survey of the epigastric region was performed, with images of the left liver lobe stored and sent to PACs.  The epigastric region was then prepped and draped in the usual sterile fashion.  Using sterile technique, the skin and subcutaneous tissues were generously infiltrated 1% lidocaine for local anesthesia. The musculature was also infiltrated with 1% lidocaine to the level of the liver capsule.  Using ultrasound, a guide needle was advanced into the heterogeneously echoic lesion of the left liver lobe. After we confirmed position of the needle tip with ultrasound, 4 separate 18 gauge core biopsy were retrieved.  Specimen were placed into formalin for transportation to the lab.  Gel-Foam pledgets were then infused with a small amount of saline.  The patient tolerated the procedure well and remained hemodynamically stable throughout.  No complications were encountered and no significant blood loss was encountered.  FINDINGS: Ultrasound survey and images during the case demonstrate heterogeneous lesion within the left liver lobe. Needle tip was observed within the lesion on each pass.  Gas was present on the final image after infusion of Gel-Foam pledgets, with no complicating features identified.  IMPRESSION: Status post ultrasound-guided biopsy of left liver lobe mass, with tissue specimen sent to pathology for complete histopathologic analysis.  Signed,  Dulcy Fanny. Earleen Newport, DO  [facility]  [facility]   Electronically Signed   By: Corrie Mckusick D.O.   On: 05/25/2015 16:27   Dg Abd 2 Views  05/26/2015   CLINICAL DATA:  History of small  bowel obstruction, NG tube placement  EXAM: ABDOMEN - 2 VIEW  COMPARISON:  Abdomen films of 05/24/2015  FINDINGS: There are persistently dilated small bowel loops most consistent with partial small bowel obstruction with scattered air-fluid levels. No significant colonic bowel gas  is seen with a small amount contrast within the nondistended descending and rectosigmoid colon. No free air is seen on the erect view. An NG tube is present with the tip coiling in the antrum of the stomach.  IMPRESSION: 1. Persistently dilated loops of small bowel consistent with partial small bowel obstruction. 2. NG tube coils in the antrum of the stomach.   Electronically Signed   By: Ivar Drape M.D.   On: 05/26/2015 07:58    Medications / Allergies:  Scheduled Meds: . enoxaparin (LOVENOX) injection  40 mg Subcutaneous QHS  . lidocaine  1 application Topical Once  . pantoprazole (PROTONIX) IV  40 mg Intravenous Q24H   Continuous Infusions: . sodium chloride 50 mL/hr at 05/26/15 1804   PRN Meds:.acetaminophen **OR** acetaminophen, HYDROmorphone (DILAUDID) injection, LORazepam, ondansetron (ZOFRAN) IV, promethazine  Antibiotics: Anti-infectives    None        Assessment/Plan HD#5 SBO likely due to pelvic mass-I don't think she is completely obstructed. No acute abdomen. She has failed non operative management.  Will obtain a plain film and replace NGT.  Discussed surgical options with the patient and s/o, wish to proceed.  Dr. Marlou Starks to further discuss later today and make more definitive decision/timing PCM-prealbumin on admission 9.  Will start TPN Pancreatic mass with mets: adenocarcinoma or pathology   Erby Pian, ANP-BC [facility] Pager [phone](7A-4:30P) For consults and floor pages call [phone](7A-4:30P)  05/27/2015 9:21 AM

## 2015-05-28 ENCOUNTER — Inpatient Hospital Stay (HOSPITAL_COMMUNITY): Payer: BC Managed Care – PPO | Admitting: Certified Registered Nurse Anesthetist

## 2015-05-28 ENCOUNTER — Encounter (HOSPITAL_COMMUNITY)
Admission: EM | Disposition: A | Discharge status: Home Health | Payer: Self-pay | Source: Home / Self Care | Attending: Internal Medicine

## 2015-05-28 ENCOUNTER — Ambulatory Visit
Admit: 2015-05-28 | Discharge: 2015-05-28 | Disposition: A | Discharge status: Home / Self Care | Payer: BC Managed Care – PPO | Attending: Radiation Oncology | Admitting: Radiation Oncology

## 2015-05-28 DIAGNOSIS — K5669 Other intestinal obstruction: Secondary | ICD-10-CM

## 2015-05-28 DIAGNOSIS — R112 Nausea with vomiting, unspecified: Secondary | ICD-10-CM

## 2015-05-28 DIAGNOSIS — I959 Hypotension, unspecified: Secondary | ICD-10-CM

## 2015-05-28 DIAGNOSIS — K869 Disease of pancreas, unspecified: Secondary | ICD-10-CM

## 2015-05-28 DIAGNOSIS — R160 Hepatomegaly, not elsewhere classified: Secondary | ICD-10-CM

## 2015-05-28 DIAGNOSIS — E43 Unspecified severe protein-calorie malnutrition: Secondary | ICD-10-CM

## 2015-05-28 DIAGNOSIS — R1084 Generalized abdominal pain: Secondary | ICD-10-CM

## 2015-05-28 HISTORY — PX: LAPAROTOMY: SHX154

## 2015-05-28 LAB — COMPREHENSIVE METABOLIC PANEL
ALT: 18 U/L (ref 14–54)
AST: 19 U/L (ref 15–41)
Albumin: 2 g/dL — ABNORMAL LOW (ref 3.5–5.0)
Alkaline Phosphatase: 74 U/L (ref 38–126)
Anion gap: 7 (ref 5–15)
BUN: 20 mg/dL (ref 6–20)
CALCIUM: 8.3 mg/dL — AB (ref 8.9–10.3)
CO2: 23 mmol/L (ref 22–32)
Chloride: 108 mmol/L (ref 101–111)
Creatinine, Ser: 1.15 mg/dL — ABNORMAL HIGH (ref 0.44–1.00)
GFR calc non Af Amer: 49 mL/min — ABNORMAL LOW (ref >60)
GFR, EST AFRICAN AMERICAN: 57 mL/min — AB (ref >60)
Glucose, Bld: 158 mg/dL — ABNORMAL HIGH (ref 65–99)
Potassium: 3.6 mmol/L (ref 3.5–5.1)
Sodium: 138 mmol/L (ref 135–145)
TOTAL PROTEIN: 4.8 g/dL — AB (ref 6.5–8.1)
Total Bilirubin: 0.5 mg/dL (ref 0.3–1.2)

## 2015-05-28 LAB — CBC
HEMATOCRIT: 26.9 % — AB (ref 36.0–46.0)
Hemoglobin: 9.2 g/dL — ABNORMAL LOW (ref 12.0–15.0)
MCH: 31.7 pg (ref 26.0–34.0)
MCHC: 34.2 g/dL (ref 30.0–36.0)
MCV: 92.8 fL (ref 78.0–100.0)
Platelets: 214 10*3/uL (ref 150–400)
RBC: 2.9 MIL/uL — ABNORMAL LOW (ref 3.87–5.11)
RDW: 15.6 % — ABNORMAL HIGH (ref 11.5–15.5)
WBC: 4.2 10*3/uL (ref 4.0–10.5)

## 2015-05-28 LAB — SURGICAL PCR SCREEN
MRSA, PCR: NEGATIVE
Staphylococcus aureus: POSITIVE — AB

## 2015-05-28 LAB — PHOSPHORUS: PHOSPHORUS: 2.6 mg/dL (ref 2.5–4.6)

## 2015-05-28 LAB — GLUCOSE, CAPILLARY
GLUCOSE-CAPILLARY: 219 mg/dL — AB (ref 65–99)
GLUCOSE-CAPILLARY: 243 mg/dL — AB (ref 65–99)
Glucose-Capillary: 144 mg/dL — ABNORMAL HIGH (ref 65–99)
Glucose-Capillary: 156 mg/dL — ABNORMAL HIGH (ref 65–99)

## 2015-05-28 LAB — MAGNESIUM: Magnesium: 1.7 mg/dL (ref 1.7–2.4)

## 2015-05-28 LAB — CG4 I-STAT (LACTIC ACID): Lactic Acid, Venous: 2.3 mmol/L (ref 0.5–2.0)

## 2015-05-28 SURGERY — LAPAROTOMY, EXPLORATORY
Anesthesia: General | Site: Abdomen

## 2015-05-28 MED ORDER — HYDROMORPHONE HCL 1 MG/ML IJ SOLN
INTRAMUSCULAR | Status: AC
  Filled 2015-05-28: qty 1

## 2015-05-28 MED ORDER — SUCCINYLCHOLINE CHLORIDE 20 MG/ML IJ SOLN
INTRAMUSCULAR | Status: DC | PRN
  Administered 2015-05-28: 80 mg via INTRAVENOUS

## 2015-05-28 MED ORDER — ONDANSETRON HCL 4 MG/2ML IJ SOLN
INTRAMUSCULAR | Status: DC | PRN
  Administered 2015-05-28: 4 mg via INTRAVENOUS

## 2015-05-28 MED ORDER — NEOSTIGMINE METHYLSULFATE 10 MG/10ML IV SOLN
INTRAVENOUS | Status: AC
  Filled 2015-05-28: qty 1

## 2015-05-28 MED ORDER — LACTATED RINGERS IV SOLN
INTRAVENOUS | Status: DC | PRN
  Administered 2015-05-28: 09:00:00 via INTRAVENOUS

## 2015-05-28 MED ORDER — PHENYLEPHRINE HCL 10 MG/ML IJ SOLN
10.0000 mg | INTRAVENOUS | Status: DC | PRN
  Administered 2015-05-28: 40 ug/min via INTRAVENOUS

## 2015-05-28 MED ORDER — POVIDONE-IODINE 10 % EX OINT
TOPICAL_OINTMENT | CUTANEOUS | Status: DC | PRN
  Administered 2015-05-28: 1 via TOPICAL

## 2015-05-28 MED ORDER — POTASSIUM CHLORIDE IN NACL 20-0.45 MEQ/L-% IV SOLN
INTRAVENOUS | Status: AC
  Administered 2015-05-28: 75 mL/h via INTRAVENOUS
  Filled 2015-05-28 (×3): qty 1000

## 2015-05-28 MED ORDER — PROPOFOL 10 MG/ML IV BOLUS
INTRAVENOUS | Status: AC
  Filled 2015-05-28: qty 20

## 2015-05-28 MED ORDER — PHENYLEPHRINE HCL 10 MG/ML IJ SOLN
30.0000 ug/min | INTRAMUSCULAR | Status: DC
  Administered 2015-05-28: 30 ug/min via INTRAVENOUS
  Administered 2015-05-29: 70 ug/min via INTRAVENOUS
  Administered 2015-05-29: 50 ug/min via INTRAVENOUS
  Administered 2015-05-29: 55 ug/min via INTRAVENOUS
  Administered 2015-05-29: 50 ug/min via INTRAVENOUS
  Administered 2015-05-29 (×2): 70 ug/min via INTRAVENOUS
  Administered 2015-05-29: 50 ug/min via INTRAVENOUS
  Administered 2015-05-30: 65 ug/min via INTRAVENOUS
  Administered 2015-05-30: 70 ug/min via INTRAVENOUS
  Administered 2015-05-30: 25 ug/min via INTRAVENOUS
  Administered 2015-05-30: 70 ug/min via INTRAVENOUS
  Filled 2015-05-28 (×17): qty 1

## 2015-05-28 MED ORDER — FAT EMULSION 20 % IV EMUL
240.0000 mL | INTRAVENOUS | Status: AC
  Administered 2015-05-28: 240 mL via INTRAVENOUS
  Filled 2015-05-28: qty 250

## 2015-05-28 MED ORDER — ARTIFICIAL TEARS OP OINT
TOPICAL_OINTMENT | OPHTHALMIC | Status: AC
  Filled 2015-05-28: qty 3.5

## 2015-05-28 MED ORDER — NEOSTIGMINE METHYLSULFATE 10 MG/10ML IV SOLN
INTRAVENOUS | Status: DC | PRN
  Administered 2015-05-28: 1 mg via INTRAVENOUS
  Administered 2015-05-28: 3 mg via INTRAVENOUS
  Administered 2015-05-28: 1 mg via INTRAVENOUS

## 2015-05-28 MED ORDER — SODIUM CHLORIDE 0.9 % IV SOLN
Freq: Once | INTRAVENOUS | Status: AC
  Administered 2015-05-28: 16:00:00 via INTRAVENOUS

## 2015-05-28 MED ORDER — MAGNESIUM SULFATE 2 GM/50ML IV SOLN
2.0000 g | Freq: Once | INTRAVENOUS | Status: AC
  Administered 2015-05-28: 2 g via INTRAVENOUS
  Filled 2015-05-28: qty 50

## 2015-05-28 MED ORDER — POVIDONE-IODINE 10 % EX OINT
TOPICAL_OINTMENT | CUTANEOUS | Status: AC
  Filled 2015-05-28: qty 28.35

## 2015-05-28 MED ORDER — ONDANSETRON HCL 4 MG/2ML IJ SOLN
INTRAMUSCULAR | Status: AC
  Filled 2015-05-28: qty 2

## 2015-05-28 MED ORDER — MIDAZOLAM HCL 2 MG/2ML IJ SOLN
INTRAMUSCULAR | Status: AC
  Filled 2015-05-28: qty 2

## 2015-05-28 MED ORDER — GLYCOPYRROLATE 0.2 MG/ML IJ SOLN
INTRAMUSCULAR | Status: DC | PRN
  Administered 2015-05-28: 0.4 mg via INTRAVENOUS
  Administered 2015-05-28: 0.1 mg via INTRAVENOUS

## 2015-05-28 MED ORDER — SODIUM CHLORIDE 0.9 % IV BOLUS (SEPSIS)
500.0000 mL | Freq: Once | INTRAVENOUS | Status: AC
  Administered 2015-05-28: 500 mL via INTRAVENOUS

## 2015-05-28 MED ORDER — LACTATED RINGERS IV SOLN: INTRAVENOUS | Status: DC

## 2015-05-28 MED ORDER — LACTATED RINGERS IV BOLUS (SEPSIS)
500.0000 mL | Freq: Once | INTRAVENOUS | Status: AC
  Administered 2015-05-28: 500 mL via INTRAVENOUS

## 2015-05-28 MED ORDER — DEXTROSE 5 % IV SOLN
2.0000 g | INTRAVENOUS | Status: AC
  Administered 2015-05-28: 2 g via INTRAVENOUS
  Filled 2015-05-28: qty 2

## 2015-05-28 MED ORDER — 0.9 % SODIUM CHLORIDE (POUR BTL) OPTIME
TOPICAL | Status: DC | PRN
  Administered 2015-05-28: 2000 mL

## 2015-05-28 MED ORDER — DEXAMETHASONE SODIUM PHOSPHATE 4 MG/ML IJ SOLN
INTRAMUSCULAR | Status: DC | PRN
  Administered 2015-05-28: 4 mg via INTRAVENOUS

## 2015-05-28 MED ORDER — FENTANYL CITRATE (PF) 100 MCG/2ML IJ SOLN
INTRAMUSCULAR | Status: DC | PRN
  Administered 2015-05-28: 100 ug via INTRAVENOUS
  Administered 2015-05-28 (×4): 50 ug via INTRAVENOUS
  Administered 2015-05-28: 100 ug via INTRAVENOUS

## 2015-05-28 MED ORDER — ROCURONIUM BROMIDE 50 MG/5ML IV SOLN
INTRAVENOUS | Status: AC
  Filled 2015-05-28: qty 1

## 2015-05-28 MED ORDER — ALBUMIN HUMAN 5 % IV SOLN
INTRAVENOUS | Status: DC | PRN
  Administered 2015-05-28: 10:00:00 via INTRAVENOUS

## 2015-05-28 MED ORDER — FENTANYL CITRATE (PF) 250 MCG/5ML IJ SOLN
INTRAMUSCULAR | Status: AC
  Filled 2015-05-28: qty 5

## 2015-05-28 MED ORDER — HYDROMORPHONE HCL 1 MG/ML IJ SOLN
0.2500 mg | INTRAMUSCULAR | Status: AC | PRN
  Administered 2015-05-28 (×8): 0.25 mg via INTRAVENOUS

## 2015-05-28 MED ORDER — POTASSIUM CHLORIDE 10 MEQ/50ML IV SOLN
10.0000 meq | INTRAVENOUS | Status: AC
  Administered 2015-05-28 – 2015-05-29 (×3): 10 meq via INTRAVENOUS
  Filled 2015-05-28 (×3): qty 50

## 2015-05-28 MED ORDER — TRACE MINERALS CR-CU-F-FE-I-MN-MO-SE-ZN IV SOLN
INTRAVENOUS | Status: AC
  Administered 2015-05-28: 18:00:00 via INTRAVENOUS
  Filled 2015-05-28: qty 1440

## 2015-05-28 MED ORDER — PHENYLEPHRINE HCL 10 MG/ML IJ SOLN
INTRAMUSCULAR | Status: DC | PRN
  Administered 2015-05-28 (×2): 80 ug via INTRAVENOUS
  Administered 2015-05-28: 120 ug via INTRAVENOUS
  Administered 2015-05-28: 80 ug via INTRAVENOUS

## 2015-05-28 MED ORDER — MIDAZOLAM HCL 5 MG/5ML IJ SOLN
INTRAMUSCULAR | Status: DC | PRN
  Administered 2015-05-28: 2 mg via INTRAVENOUS

## 2015-05-28 MED ORDER — DEXAMETHASONE SODIUM PHOSPHATE 4 MG/ML IJ SOLN
INTRAMUSCULAR | Status: AC
  Filled 2015-05-28: qty 1

## 2015-05-28 MED ORDER — ROCURONIUM BROMIDE 100 MG/10ML IV SOLN
INTRAVENOUS | Status: DC | PRN
  Administered 2015-05-28: 30 mg via INTRAVENOUS
  Administered 2015-05-28: 20 mg via INTRAVENOUS

## 2015-05-28 MED ORDER — PROPOFOL 10 MG/ML IV BOLUS
INTRAVENOUS | Status: DC | PRN
  Administered 2015-05-28: 110 mg via INTRAVENOUS

## 2015-05-28 MED ORDER — SUCCINYLCHOLINE CHLORIDE 20 MG/ML IJ SOLN
INTRAMUSCULAR | Status: AC
  Filled 2015-05-28: qty 1

## 2015-05-28 MED ORDER — MORPHINE SULFATE 2 MG/ML IJ SOLN
1.0000 mg | INTRAMUSCULAR | Status: DC | PRN
  Administered 2015-05-28 (×3): 2 mg via INTRAVENOUS
  Administered 2015-05-29: 4 mg via INTRAVENOUS
  Administered 2015-05-29 – 2015-05-30 (×6): 2 mg via INTRAVENOUS
  Filled 2015-05-28 (×9): qty 1
  Filled 2015-05-28: qty 2

## 2015-05-28 MED ORDER — GLYCOPYRROLATE 0.2 MG/ML IJ SOLN
INTRAMUSCULAR | Status: AC
  Filled 2015-05-28: qty 2

## 2015-05-28 SURGICAL SUPPLY — 60 items
BAG URINE DRAINAGE (UROLOGICAL SUPPLIES) ×2 IMPLANT
BLADE SURG ROTATE 9660 (MISCELLANEOUS) ×2 IMPLANT
CANISTER SUCTION 2500CC (MISCELLANEOUS) ×3 IMPLANT
CATH DRAINAGE MALECOT 26FR (CATHETERS) IMPLANT
CATH MALECOT (CATHETERS) ×3
CHLORAPREP W/TINT 26ML (MISCELLANEOUS) ×5 IMPLANT
COVER MAYO STAND STRL (DRAPES) IMPLANT
COVER SURGICAL LIGHT HANDLE (MISCELLANEOUS) ×3 IMPLANT
DRAPE LAPAROSCOPIC ABDOMINAL (DRAPES) ×3 IMPLANT
DRAPE PROXIMA HALF (DRAPES) IMPLANT
DRAPE UTILITY XL STRL (DRAPES) ×2 IMPLANT
DRAPE WARM FLUID 44X44 (DRAPE) ×3 IMPLANT
DRSG OPSITE POSTOP 4X10 (GAUZE/BANDAGES/DRESSINGS) ×2 IMPLANT
DRSG OPSITE POSTOP 4X8 (GAUZE/BANDAGES/DRESSINGS) IMPLANT
ELECT BLADE 6.5 EXT (BLADE) IMPLANT
ELECT CAUTERY BLADE 6.4 (BLADE) ×4 IMPLANT
ELECT REM PT RETURN 9FT ADLT (ELECTROSURGICAL) ×3
ELECTRODE REM PT RTRN 9FT ADLT (ELECTROSURGICAL) ×1 IMPLANT
GLOVE BIO SURGEON STRL SZ7 (GLOVE) ×4 IMPLANT
GLOVE BIO SURGEON STRL SZ7.5 (GLOVE) ×5 IMPLANT
GLOVE BIO SURGEON STRL SZ8 (GLOVE) ×4 IMPLANT
GLOVE BIOGEL PI IND STRL 7.0 (GLOVE) IMPLANT
GLOVE BIOGEL PI IND STRL 7.5 (GLOVE) IMPLANT
GLOVE BIOGEL PI IND STRL 8 (GLOVE) IMPLANT
GLOVE BIOGEL PI INDICATOR 7.0 (GLOVE) ×2
GLOVE BIOGEL PI INDICATOR 7.5 (GLOVE) ×2
GLOVE BIOGEL PI INDICATOR 8 (GLOVE) ×2
GLOVE SURG SS PI 7.5 STRL IVOR (GLOVE) ×4 IMPLANT
GOWN STRL REUS W/ TWL LRG LVL3 (GOWN DISPOSABLE) ×3 IMPLANT
GOWN STRL REUS W/ TWL XL LVL3 (GOWN DISPOSABLE) IMPLANT
GOWN STRL REUS W/TWL LRG LVL3 (GOWN DISPOSABLE) ×6
GOWN STRL REUS W/TWL XL LVL3 (GOWN DISPOSABLE) ×6
KIT BASIN OR (CUSTOM PROCEDURE TRAY) ×3 IMPLANT
KIT ROOM TURNOVER OR (KITS) ×3 IMPLANT
LIGASURE IMPACT 36 18CM CVD LR (INSTRUMENTS) ×2 IMPLANT
NS IRRIG 1000ML POUR BTL (IV SOLUTION) ×6 IMPLANT
PACK GENERAL/GYN (CUSTOM PROCEDURE TRAY) ×3 IMPLANT
PAD ARMBOARD 7.5X6 YLW CONV (MISCELLANEOUS) ×3 IMPLANT
PENCIL BUTTON HOLSTER BLD 10FT (ELECTRODE) IMPLANT
PLUG CATH AND CAP STER (CATHETERS) ×2 IMPLANT
SPECIMEN JAR LARGE (MISCELLANEOUS) IMPLANT
SPONGE LAP 18X18 X RAY DECT (DISPOSABLE) IMPLANT
STAPLER GUN LINEAR PROX 60 (STAPLE) ×2 IMPLANT
STAPLER PROXIMATE 75MM BLUE (STAPLE) ×2 IMPLANT
STAPLER VISISTAT 35W (STAPLE) ×3 IMPLANT
SUCTION POOLE TIP (SUCTIONS) ×5 IMPLANT
SUT ETHILON 3 0 FSL (SUTURE) ×2 IMPLANT
SUT NOVA 1 T20/GS 25DT (SUTURE) ×6 IMPLANT
SUT PDS AB 1 TP1 96 (SUTURE) ×2 IMPLANT
SUT SILK 2 0 SH CR/8 (SUTURE) ×3 IMPLANT
SUT SILK 2 0 TIES 10X30 (SUTURE) ×3 IMPLANT
SUT SILK 3 0 SH CR/8 (SUTURE) ×3 IMPLANT
SUT SILK 3 0 TIES 10X30 (SUTURE) ×3 IMPLANT
SUT VIC AB 3-0 SH 18 (SUTURE) IMPLANT
SYRINGE IRR TOOMEY STRL 70CC (SYRINGE) ×2 IMPLANT
TOWEL OR 17X26 10 PK STRL BLUE (TOWEL DISPOSABLE) ×3 IMPLANT
TRAY FOLEY CATH 16FRSI W/METER (SET/KITS/TRAYS/PACK) ×2 IMPLANT
TUBE CONNECTING 12'X1/4 (SUCTIONS)
TUBE CONNECTING 12X1/4 (SUCTIONS) IMPLANT
YANKAUER SUCT BULB TIP NO VENT (SUCTIONS) IMPLANT

## 2015-05-28 NOTE — Progress Notes (Signed)
Patient extubated with Dr Ola Spurr at bedside. Patient placed on simple mask. Tolerated well.

## 2015-05-28 NOTE — Progress Notes (Addendum)
[facility] NOTE  Pharmacy Consult for TPN Indication: SBO, expected prolonged ileus  Allergies  Allergen Reactions  . Aspirin     Patient Measurements: Height: _0  (167.6 cm) Weight: 138 lb 0.1 oz (62.6 kg) IBW/kg (Calculated) : 59.3  Vital Signs: Temp: 99.4 F (37.4 C) (06/23 0706) Temp Source: Oral (06/23 0706) BP: 97/62 mmHg (06/23 0706) Pulse Rate: 117 (06/23 0706) Intake/Output from previous day: 06/22 0701 - 06/23 0700 In: 0  Out: 1200 [Emesis/NG output:1200] Intake/Output from this shift:    Labs:  Recent Labs  05/26/15 0700 05/27/15 0538 05/28/15 0500  WBC 6.2 6.4 4.2  HGB 9.2* 9.3* 9.2*  HCT 27.3* 26.9* 26.9*  PLT 164 215 214     Recent Labs  05/26/15 0700 05/27/15 0538 05/28/15 0500  NA 146* 139 138  K 3.8 3.4* 3.6  CL 116* 107 108  CO2 _1 GLUCOSE 115* 118* 158*  BUN 21* 17 20  CREATININE 1.09* 1.17* 1.15*  CALCIUM 8.5* 8.1* 8.3*  MG  --   --  1.7  PHOS  --   --  2.6  PROT  --   --  4.8*  ALBUMIN  --   --  2.0*  AST  --   --  19  ALT  --   --  18  ALKPHOS  --   --  74  BILITOT  --   --  0.5   Estimated Creatinine Clearance: 45.7 mL/min (by C-G formula based on Cr of 1.15).    Recent Labs  05/27/15 2058 05/27/15 2353 05/28/15 0357  GLUCAP 133* 164* 144*    Medical History: Past Medical History  Diagnosis Date  . Cancer     breast  . Hypertension   . Hyperlipidemia   . Allergy   . Chocolate cyst of ovary   . Breast cancer, right breast Jan 2001    [id number] radiation and chemo granfortuna    Insulin Requirements in the past 24 hours:  4 units sensitive SSI q4h in past 24h  Assessment: 66 yof with presenting 05/22/15 with worsening N/V and abdominal pain. Duration of symptoms 2 months and has lost 11-12 lb in last month. Nutrition notes patient with moderate depletion in body fat, depletions in muscle mass. Patient found to have SBO likely due to pelvic mass - anticipated prolonged ileus post-op  and patient has been NPO x ~5 days inpatient. Pharmacy consulted to initiate TPN for SBO and anticipated prolonged ileus post-op.  Surgeries/Procedures: 6/23: to OR today for ex-lap  GI: SBO likely due to pelvic mass - anticipated prolonged ileus post-op. Prealbumin 9 on admit. Did not tolerate sips of clears on 6/21. Throwing up bile and having BMs on 6/22. Failed non-operative management - NGT clamped on 6/21, replaced 6/22 with vomiting continuing (1268m/24h). To OR today for ex-lap. PPI, zofran prn x3 doses, promethazine x1 yesterday  Endo: No DM hx. CBGs 144-164 on TPN (BG controlled prior to TPN)  Lytes: K up to 3.6 - s/p 2 k runs yesterday + K in IVF (goal >/=4 with ileus), Mg 1.7 (goal >/=2 with ileus), coCa ok 9.8, other lytes wnl  Renal: SCr stable 1.15, CrCl~45, I/Os appear innaccurate. 1/2NS+40K KVO'd  Cards: HTN, HLD  Hepatobil: LFTs/alk phos/tbili wnl  Neuro: Anxiety. hydromorphone prn - 5 doses in past 24h, lorazepam prn  Heme/Onc: Concerning pancreatic mass with liver mets. Hx breast cancer s/p mastectomy. Awaiting biopsy results from 6/20  ID: Afebrile, wbc wnl, no  antibiotics 6/17 BCx4>>ngf 6/17 UC>>ngf  Best Practices: enox40 TPN Access: PICC placed 05/27/15 TPN start date: 05/27/15>>  Current Nutrition:  Clinimix E 5/15 _0  ml/h + 20% IVFE _1  ml/h NPO  Nutritional Goals:  1750-1950 kCal, 90-100 grams of protein per day (per Nutrition assessment)  Plan: - Increase Clinimix E 5/15 to 60 ml/h + 20% IVFE _2  ml/h (will provide 72g protein + 1502 Kcal) - Multivitamin and trace elements daily in TPN - Continue sensitive SSI and monitor CBGs - K runs x4 - Mg sulfate 2g x1 - F/u AM labs   Elicia Lamp, PharmD Clinical Pharmacist - Resident Pager [phone] 05/28/2015 7:58 AM

## 2015-05-28 NOTE — Progress Notes (Signed)
eLink Physician-Brief Progress Note Patient Name: Ashley Hurley DOB: 04-24-1951 MRN: [id number]   Date of Service  05/28/2015  HPI/Events of Note  Admit from PACU Post exlap for lysis of adhesions in setting of metastatic pancreatic CA and SBO Had G tube placed Extubated Tachy, off pressors On TPN  eICU Interventions  No eICU intervention at this time     Intervention Category Evaluation Type: New Patient Evaluation  MCQUAID, DOUGLAS 05/28/2015, 6:22 PM

## 2015-05-28 NOTE — Progress Notes (Signed)
Spoke to Dr Orene Desanctis regarding ST 130-135. No new orders received, will tolerate ST as long as BP remain stable and did not want to treat at this time as issue is most likely volume related.

## 2015-05-28 NOTE — Anesthesia Procedure Notes (Signed)
Procedure Name: Intubation Date/Time: 05/28/2015 9:15 AM Performed by: [place] Pink Pre-anesthesia Checklist: Patient identified, Emergency Drugs available, Suction available, Patient being monitored and Timeout performed Patient Re-evaluated:Patient Re-evaluated prior to inductionOxygen Delivery Method: Circle system utilized Preoxygenation: Pre-oxygenation with 100% oxygen Intubation Type: IV induction and Rapid sequence Laryngoscope Size: Mac and 3 Grade View: Grade I Tube type: Oral Tube size: 7.0 mm Number of attempts: 1 Airway Equipment and Method: Stylet Placement Confirmation: ETT inserted through vocal cords under direct vision,  positive ETCO2 and breath sounds checked- equal and bilateral Secured at: 20 cm Tube secured with: Tape Dental Injury: Teeth and Oropharynx as per pre-operative assessment

## 2015-05-28 NOTE — Progress Notes (Signed)
Spoke to Dr Marlou Starks regarding patients BP. Able to discontinue Neo and maintain BP. Sinus Tachy 120-130. Will bolus 1L NS and put in request for ICU bed for the night. Will continue to monitor.

## 2015-05-28 NOTE — Progress Notes (Signed)
Patient ID: Ashley Hurley, female   DOB: January 24, 1951, 64 y.o.   MRN: [id number] [facility] PROGRESS NOTE  Brianah Hopson CHE:[id number] DOB: 05/18/1951 DOA: 05/22/2015 PCP: Lottie Dawson, MD   Brief narrative:    64 year old female with HTN, history of breast cancer, presented to [facility] ED with main concern of several days duration of progressively worsening abd pain associated with nausea and non bloody vomiting. Current work up notable for SBO, pancreatic and liver mass and was admitted for further management.   Assessment/Plan:    Active Problems:   SBO - pt will be taken to OR today  - Appreciate surgery team following - biopsy results positive for adenocarcinoma (pancreatobiliary)     Right hydroureteronephrosis  - secondary to a soft tissue mass in the right lower pelvis - Urologist recommend conservative management of asymptomatic hydronephrosis at this time.  - since Cr trending down, no stenting needed at this time     Acute renal failure - Secondary to right hydroureteronephrosis and prerenal etiology from dehydration in the setting of SBO - IV fluids provided, creatinine continues trending down  - Repeat BMP in the morning    Hypokalemia - supplement in IVF and WNL this AM - keep K ~4    Hypernatremia - Na is WNL this AM     Pancreatic mass with diffuse metastatic process - IR done liver biopsy 6/20. CA 19-9 is 4179. 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 Earleen Newport)    Brief episode of SVT 6/18 - remains asymptomatic - resolved  - d/c telemetry 6/21    Anemia of chronic disease, ? Malignancy - anemia panel pending - Hg has remained stable over the past 48 hours  - repeat CBC in AM    Liver mass, left lobe - biopsy results noted above     Severe PCM - in the context of acute illness - appreciate nutritionist team assistance      Anxiety  - stable at this time     HTN - reasonable inpatient control off antihypertensives     Severe PCM - in  the context of acute illness  DVT prophylaxis - Lovenox SQ  Code Status: Full.  Family Communication:  plan of care discussed with family at bedside  Disposition Plan: In surgery today so not ready for d/c  IV access:  Peripheral IV  Procedures and diagnostic studies:    Dg Abd 1 View 05/23/2015   Persistent dilated small bowel loops, 4.9 cm in diameter, c/w SBO ? From RLQ mass  US Abdomen Complete 05/18/2015   2.7 cm complex abnormality seen in left hepatic lobe, ? Neoplasm, epigastric mass measuring 4.1 x 3.6 x 2.9 cm  Ct Abdomen Pelvis W Contrast 05/19/2015  6 cm mass in pancreatic body, ? pancreatic ca with splenic vein thrombosis. 3.5 cm mass in left hepatic lobe, ? liver met's. Right hydro  and distal SBO, d/d includes metastatic disease, or primary malignancies such as ovarian carcinoma, appendiceal carcinoma, or carcinoid tumor.   US Renal 05/18/2015   Moderate right hydronephrosis.     Dg Abd 2 Views 05/24/2015   Mild decrease in gaseous dilatation of small bowel loops which may indicate improving small bowel obstruction.     Dg Abd Acute W/chest 05/22/2015  Dilated small bowel loops are noted consistent with distal small bowel obstruction.     Dg Naso G Tube Plc  W/fl W/rad 05/22/2015  Fluoroscopically guided nasogastric tube placement with tip in the stomach.   Medical Consultants:  Urology for evaluation of hydronephrosis Surgery for SBO management IR for biopsy   Other Consultants:  None   IAnti-Infectives:   None   Faye Ramsay, MD  [facility] Pager [phone]  If 7PM-7AM, please contact night-coverage [email or web address] Password [facility] 05/28/2015, 11:24 AM   LOS: 6 days   HPI/Subjective: No events overnight.   Objective: Filed Vitals:   05/27/15 0046 05/27/15 0606 05/27/15 2102 05/28/15 0706  BP:  '95/66 89/64 97/62 '  Pulse:  114 115  117  Temp: 98.3 F (36.8 C) 98.2 F (36.8 C) 98.1 F (36.7 C) 99.4 F (37.4 C)  TempSrc: Oral Oral Oral Oral  Resp:  16  18  Height:      Weight:  62.6 kg (138 lb 0.1 oz)    SpO2:  97% 93% 94%    Intake/Output Summary (Last 24 hours) at 05/28/15 1124 Last data filed at 05/28/15 1045  Gross per 24 hour  Intake   1850 ml  Output   1300 ml  Net    550 ml    Exam:   General:  Pt is alert, follows commands appropriately, not in acute distress  Cardiovascular: Regular rhythm, tachycardic, no rubs, no gallops  Respiratory: Clear to auscultation bilaterally, no wheezing, no crackles, no rhonchi  Abdomen: Soft, distended, mild tenderness in epigastric area  Data Reviewed: Basic Metabolic Panel:  Recent Labs Lab 05/23/15 1410 05/24/15 0351 05/25/15 0741 05/26/15 0700 05/27/15 0538 05/28/15 0500  NA  --  141 143 146* 139 138  K  --  3.7 4.3 3.8 3.4* 3.6  CL  --  111 112* 116* 107 108  CO2  --  '22 22 22 22 23  ' GLUCOSE  --  114* 110* 115* 118* 158*  BUN  --  18 20 21* 17 20  CREATININE  --  1.34* 1.29* 1.09* 1.17* 1.15*  CALCIUM  --  7.9* 8.3* 8.5* 8.1* 8.3*  MG 1.9  --   --   --   --  1.7  PHOS  --   --   --   --   --  2.6   Liver Function Tests:  Recent Labs Lab 05/22/15 1153 05/23/15 0523 05/28/15 0500  AST '22 15 19  ' ALT '27 21 18  ' ALKPHOS 92 75 74  BILITOT 0.9 0.7 0.5  PROT 6.5 5.3* 4.8*  ALBUMIN 3.0* 2.2* 2.0*    Recent Labs Lab 05/22/15 1153  LIPASE 57*   CBC:  Recent Labs Lab 05/22/15 1153  05/24/15 0351 05/25/15 0741 05/26/15 0700 05/27/15 0538 05/28/15 0500  WBC 8.4  < > 6.2 4.8 6.2 6.4 4.2  NEUTROABS 5.9  --   --   --   --   --   --   HGB 13.2  < > 9.9* 9.2* 9.2* 9.3* 9.2*  HCT 38.0  < > 29.7* 28.2* 27.3* 26.9* 26.9*  MCV 92.2  < > 93.7 94.6 95.1 92.4 92.8  PLT 254  < > 220 177 164 215 214  < > = values in this interval not displayed. Recent Results (from the past 240 hour(s))  Blood culture (routine x 2)     Status: None    Collection Time: 05/22/15 11:43 AM  Result Value Ref Range Status   Specimen Description BLOOD HAND RIGHT  Final   Special Requests BOTTLES DRAWN AEROBIC ONLY 3CC  Final   Culture NO  GROWTH 5 DAYS  Final   Report Status 05/27/2015 FINAL  Final  Blood culture (routine x 2)     Status: None   Collection Time: 05/22/15 12:42 PM  Result Value Ref Range Status   Specimen Description BLOOD ARM LEFT  Final   Special Requests BOTTLES DRAWN AEROBIC AND ANAEROBIC 5CC  Final   Culture NO GROWTH 5 DAYS  Final   Report Status 05/27/2015 FINAL  Final  Urine culture     Status: None   Collection Time: 05/22/15  1:21 PM  Result Value Ref Range Status   Specimen Description URINE, CLEAN CATCH  Final   Special Requests NONE  Final   Culture   Final    MULTIPLE SPECIES PRESENT, SUGGEST RECOLLECTION IF CLINICALLY INDICATED   Report Status 05/23/2015 FINAL  Final  Blood culture (routine x 2)     Status: None   Collection Time: 05/22/15  5:05 PM  Result Value Ref Range Status   Specimen Description BLOOD LEFT ANTECUBITAL  Final   Special Requests BOTTLES DRAWN AEROBIC ONLY  8 CC  Final   Culture NO GROWTH 5 DAYS  Final   Report Status 05/27/2015 FINAL  Final  Surgical pcr screen     Status: Abnormal   Collection Time: 05/28/15  7:39 AM  Result Value Ref Range Status   MRSA, PCR NEGATIVE NEGATIVE Final   Staphylococcus aureus POSITIVE (A) NEGATIVE Final    Comment:        The Xpert SA Assay (FDA approved for NASAL specimens in patients over 21 years of age), is one component of a comprehensive surveillance program.  Test performance has been validated by [facility] for patients greater than or equal to 58 year old. It is not intended to diagnose infection nor to guide or monitor treatment.      Scheduled Meds: . [MAR Hold] enoxaparin (LOVENOX) injection  40 mg Subcutaneous QHS  . [MAR Hold] insulin aspart  0-9 Units Subcutaneous 6 times per day  . [MAR Hold] pantoprazole (PROTONIX) IV   40 mg Intravenous Q24H   Continuous Infusions: . Marland KitchenTPN (CLINIMIX-E) Adult 40 mL/hr (05/28/15 0859)   And  . fat emulsion 240 mL (05/27/15 1755)  . lactated ringers    . sodium chloride 0.45 % with kcl 10 mL/hr at 05/27/15 1911

## 2015-05-28 NOTE — Consult Note (Addendum)
PULMONARY / CRITICAL CARE MEDICINE   Name: Ashley Hurley MRN: [id number] DOB: 11-09-1951    ADMISSION DATE:  05/22/2015 CONSULTATION DATE:  05/28/2015  REFERRING MD :  Doyle Askew  CHIEF COMPLAINT:  Hypotension post ex-lap  INITIAL PRESENTATION:  64 y.o. F with hx breast CA s/p mastectomy, admitted 6/17 with N/V and abd pain.  Found to have pancreatic mass, liver mass, LAD, SBO.  Failed conservative management for SBO therefor taken to OR 6/23 for ex lap.  Following surgery, she remained hypotensive; therefore, PCCM was consulted.    STUDIES:  CT A/P 6/14 >>> mass centered in pancreatic body suspicious for primary pancreatic carcinoma. Mass in left hepatic lobe suspicious for liver mets.  Right hydroureteronephrosis and distal SBO due to irregular soft tissue mass in right adnexa.  SIGNIFICANT EVENTS: 6/17 - admit. 6/20 - liver biopsy (pathology positive for adenocarcinoma - pancreatobiliary). 6/23 - ex lap with SB to left colon bypass (palliative), gastrostomy tube placement.   HISTORY OF PRESENT ILLNESS:  Ashley Hurley is a 64 y.o. F w PMH as outlined below.  She was admitted 6/17 with N/V, abd pain.  She had outpatient CT scan prior which showed pancreatic mass, liver mass, lymphadenopathy, SBO, hydronephrosis. She was admitted for conservative management of SBO and further workup of suspected pancreatic CA with liver mets.  She unfortunately failed conservative management and on 6/23, was taken to OR for ex lap with SB to left colon bypass (palliative), gastrostomy tube placement.  Following surgery, she was hypotensive; therefore, PCCM was consulted.  CA 19-9, CA 125, and CEA all elevated.  Liver biopsy via IR positive for adenocarcinoma (pancreatobiliary).  She was evaluated by oncology who will revisit post op to see whether there is any role in XRT.   PAST MEDICAL HISTORY :   has a past medical history of Cancer; Hypertension; Hyperlipidemia; Allergy; Chocolate cyst of ovary;  and Breast cancer, right breast (Jan 2001).  has past surgical history that includes Breast surgery and Oophorectomy. Prior to Admission medications   Medication Sig Start Date End Date Taking? Authorizing Provider  escitalopram (LEXAPRO) 20 MG tablet TAKE 1 TABLET EVERY DAY 09/09/14  Yes Burnis Medin, MD  omeprazole (PRILOSEC) 40 MG capsule Take 1 capsule (40 mg total) by mouth daily. 05/01/15  Yes Laurey Morale, MD  oxyCODONE-acetaminophen (PERCOCET/ROXICET) 5-325 MG per tablet Take 1 tablet by mouth 2 (two) times daily.   Yes Historical Provider, MD  lisinopril-hydrochlorothiazide (PRINZIDE,ZESTORETIC) 10-12.5 MG per tablet TAKE 1 TABLET BY MOUTH EVERY DAY Patient not taking: Reported on 05/21/2015 09/09/14   Burnis Medin, MD  niacin (NIASPAN) 750 MG CR tablet Take 2 tablets (1,500 mg total) by mouth at bedtime. Patient not taking: Reported on 05/19/2015 09/09/14   Burnis Medin, MD   Allergies  Allergen Reactions  . Aspirin     FAMILY HISTORY:  Family History  Problem Relation Age of Onset  . Coronary artery disease Mother 78  . Diabetes Mother   . Cancer Father 73    pancreatic  . Cancer Sister     breast  . Hypertension      family hx  . Hypertension Sister   . Stroke Sister   . Colon cancer Neg Hx   . Rectal cancer Neg Hx   . Stomach cancer Neg Hx     SOCIAL HISTORY:  reports that she has never smoked. She does not have any smokeless tobacco history on file. She reports that she drinks alcohol. She  reports that she does not use illicit drugs.  REVIEW OF SYSTEMS:   All negative; except for those that are bolded, which indicate positives.  Constitutional: weight loss, weight gain, night sweats, fevers, chills, fatigue, weakness.  HEENT: headaches, sore throat, sneezing, nasal congestion, post nasal drip, difficulty swallowing, tooth/dental problems, visual complaints, visual changes, ear aches. Neuro: difficulty with speech, weakness, numbness, ataxia. CV:  chest pain,  orthopnea, PND, swelling in lower extremities, dizziness, palpitations, syncope.  Resp: cough, hemoptysis, dyspnea, wheezing. GI  heartburn, indigestion, abdominal pain at incision site, nausea, vomiting, diarrhea, constipation, change in bowel habits, loss of appetite, hematemesis, melena, hematochezia.  GU: dysuria, change in color of urine, urgency or frequency, flank pain, hematuria. MSK: joint pain or swelling, decreased range of motion. Psych: change in mood or affect, depression, anxiety, suicidal ideations, homicidal ideations. Skin: rash, itching, bruising.   SUBJECTIVE:  In good spirits despite newly discovered adenocarcinoma.  Denies chest pain, SOB post op.  Has incisional pain but is controlled on current pain regimen.  VITAL SIGNS: Temp:  [97.6 F (36.4 C)-99.4 F (37.4 C)] 98.8 F (37.1 C) (06/23 1928) Pulse Rate:  [85-137] 115 (06/23 2100) Resp:  [8-26] 15 (06/23 2100) BP: (68-116)/(44-79) 86/57 mmHg (06/23 2100) SpO2:  [94 %-100 %] 98 % (06/23 2100) FiO2 (%):  [30 %-50 %] 30 % (06/23 1223) HEMODYNAMICS:   VENTILATOR SETTINGS: Vent Mode:  [-] PSV;CPAP FiO2 (%):  [30 %-50 %] 30 % Set Rate:  [14 bmp] 14 bmp Vt Set:  [500 mL] 500 mL PEEP:  [5 cmH20] 5 cmH20 Pressure Support:  [5 cmH20] 5 cmH20 Plateau Pressure:  [14 cmH20] 14 cmH20 INTAKE / OUTPUT: Intake/Output      06/23 0701 - 06/24 0700   P.O.    I.V. (mL/kg) 4324 (69.1)   IV Piggyback 850   TPN 280   Total Intake(mL/kg) 5454 (87.1)   Urine (mL/kg/hr) 405 (0.4)   Emesis/NG output 150 (0.2)   Drains 100 (0.1)   Blood 100 (0.1)   Total Output 755   Net +4699         PHYSICAL EXAMINATION: General: Pleasant adult female, in NAD. Neuro: A&O x 3, non-focal.  HEENT: [place]/AT. PERRL, sclerae anicteric. Cardiovascular: Tachy, regular, no M/R/G.  Lungs: Respirations even and unlabored.  CTA bilaterally, No W/R/R.  Abdomen: Abdominal midline incision dressings C/D/I, gastrostomy tube in place.  Abd  appropriately tender.  Non-distended. Musculoskeletal: No gross deformities, no edema.  Skin: Intact, warm, no rashes.  LABS:  CBC  Recent Labs Lab 05/26/15 0700 05/27/15 0538 05/28/15 0500  WBC 6.2 6.4 4.2  HGB 9.2* 9.3* 9.2*  HCT 27.3* 26.9* 26.9*  PLT 164 215 214   Coag's  Recent Labs Lab 05/24/15 0351  INR 1.32   BMET  Recent Labs Lab 05/26/15 0700 05/27/15 0538 05/28/15 0500  NA 146* 139 138  K 3.8 3.4* 3.6  CL 116* 107 108  CO2 22 22 23   BUN 21* 17 20  CREATININE 1.09* 1.17* 1.15*  GLUCOSE 115* 118* 158*   Electrolytes  Recent Labs Lab 05/23/15 1410  05/26/15 0700 05/27/15 0538 05/28/15 0500  CALCIUM  --   < > 8.5* 8.1* 8.3*  MG 1.9  --   --   --  1.7  PHOS  --   --   --   --  2.6  < > = values in this interval not displayed. Sepsis Markers  Recent Labs Lab 05/22/15 1204  LATICACIDVEN 2.48*   ABG  No results for input(s): PHART, PCO2ART, PO2ART in the last 168 hours. Liver Enzymes  Recent Labs Lab 05/22/15 1153 05/23/15 0523 05/28/15 0500  AST 22 15 19   ALT 27 21 18   ALKPHOS 92 75 74  BILITOT 0.9 0.7 0.5  ALBUMIN 3.0* 2.2* 2.0*   Cardiac Enzymes No results for input(s): TROPONINI, PROBNP in the last 168 hours. Glucose  Recent Labs Lab 05/27/15 1644 05/27/15 2058 05/27/15 2353 05/28/15 0357 05/28/15 1142 05/28/15 1813  GLUCAP 99 133* 164* 144* 156* 219*    Imaging No results found.   ASSESSMENT / PLAN:  CARDIOVASCULAR RUE PICC 6/22 >>> A:  Hypotension post op - likely due to hypovolemia. Hx HTN, HLD. P:  Continue IVF resuscitation - pt appears clinically dry. Goal MAP > 60 Neosynephrine PRN to maintain above goal. Repeat lactate. Hold outpatient lisinopril-hctz, niacin.  GASTROINTESTINAL A:   SBO - s/p ex lap 6/23 with SB to left colon bypass (palliative), gastrostomy tube placement (Dr. Marlou Starks). Pancreatic mass - unresectable per Dr. Marlou Starks. Protein calorie malnutrition. GERD. Nutrition. P:   Post op  care per [facility]. Pantoprazole. Continue TPN. Timing of PO nutrition per [facility].  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 Chronic anemia. VTE Prophylaxis. P:  Oncology following - will revisit post op to evaluate for role of XRT. Transfuse for Hgb < 7. SCD's / Lovenox. CBC in AM.  PULMONARY A: Acute hypoxic respiratory failure. At risk post op atelectasis. P:   Continue supplemental O2 as needed to maintain SpO2 > 92%. Incentive spirometry / pulmonary hygiene. pcxr for atx  RENAL A:   AKI. Pseudohypocalcemia - corrects to 9.9. Right hydroureteronephrosis - due to tissue mass in right lower pelvis. P:   Continue IVF hydration. Send ionized calcium. Seen by urology - recommending conservative management. BMP in AM.  INFECTIOUS A:   Surgical prophylaxis. P:   Cefoxitin x 1.  ENDOCRINE A:   Hyperglycemia.  P:   SSI. Hgb A1C.  NEUROLOGIC A:   Pain post ex lap. Hx Anxiety. P:   Dilaudid PRN, Morphine PRN. Ativan PRN.  Family updated: None.  Interdisciplinary Family Meeting v Palliative Care Meeting:  Due by: 6/29.  CC time:  30 min.   Montey Hora, [place] [facility] Pager: [phone]  or [phone] 05/28/2015, 9:30 PM  STAFF NOTE: I, Merrie Roof, MD FACP have personally reviewed patient's available data, including medical history, events of note, physical examination and test results as part of my evaluation. I have discussed with resident/NP and other care providers such as pharmacist, RN and RRT. In addition, I personally evaluated patient and elicited key findings of: awake, alert, no distress, concern is hypovolemia as cause shock, neo to MAP 55-60 likely adequate but LA clearance needed, repeat lactic, assess cortisol, assess cvp, assess pcxr for edema , likely needs further volume, caution narcs, no evidence active  infection, repeat cbc this am, r/o ischemic compoenent The patient is critically ill with multiple organ systems failure and requires high complexity decision making for assessment and support, frequent evaluation and titration of therapies, application of advanced monitoring technologies and extensive interpretation of multiple databases.   Critical Care Time devoted to patient care services described in this note is30 Minutes. This time reflects time of care of this signee: Merrie Roof, MD FACP. This critical care time does not reflect procedure time, or  teaching time or supervisory time of PA/NP/Med student/Med Resident etc but could involve care discussion time. Rest per NP/medical resident whose note is outlined above and that I agree with   Lavon Paganini. Titus Mould, MD, [place] Pgr: [facility] 05/29/2015 6:57 AM

## 2015-05-28 NOTE — Op Note (Signed)
05/22/2015 - 05/28/2015  10:59 AM  PATIENT:  Ashley Hurley  64 y.o. female  PRE-OPERATIVE DIAGNOSIS:  Small Bowel Obstruction  POST-OPERATIVE DIAGNOSIS:  Small Bowel Obstruction  PROCEDURE:  Procedure(s): EXPLORATORY LAPAROTOMY WITH SMALL BOWEL TO LEFT COLON BYPASS - Palliative GASTROSTOMY TUBE WITH 26 MALEncrodT (N/A)  SURGEON:  Surgeon(s) and Role:    * Jovita Kussmaul, MD - Primary    * Georganna Skeans, MD - Assisting  PHYSICIAN ASSISTANT:   ASSISTANTS: Dr. Grandville Silos   ANESTHESIA:   general  EBL:  Total I/O In: 4010 [I.V.:1600; IV Piggyback:250] Out: 100 [Urine:100]  BLOOD ADMINISTERED:none  DRAINS: Gastrostomy Tube   LOCAL MEDICATIONS USED:  NONE  SPECIMEN:  No Specimen  DISPOSITION OF SPECIMEN:  N/A  COUNTS:  YES  TOURNIQUET:  * No tourniquets in log *  DICTATION: .Dragon Dictation  After informed consent was obtained the patient was brought to the operating room and placed in the supine position on the operating table. After adequate induction of general anesthesia the patient's abdomen was prepped with ChloraPrep, allowed to dry, and draped in usual sterile manner. A midline incision was made with a 10 blade knife. This incision was carried through the skin and subcutaneous tissue sharply with electrocautery until the linea alba was identified. The linea alba was incised with the electrocautery. There was clearly mesh previously placed on the anterior surface of the abdominal wall fascia that was well incorporated. The peritoneum was identified and grasped with hemostats. The peritoneum was opened sharply with Metzenbaum scissors. This allowed [place] access to the abdominal cavity. The rest of the incision was opened with the electrocautery under direct vision. There was a moderate amount of ascites fluid identified and evacuated. The small bowel was run from the ligament of Treitz to the ileocecal valve. The small bowel was dilated. The small bowel was tethered in the right  pelvis to a metastatic implant that was fixed and not resectable there was also omentum identified that was seeming to come inferiorly from the abdominal wall to the base of the small bowel mesentery forming a very tight band. This omentum was divided sharply with the Harmonic scalpel. The right and transverse colon were also involved with fixed hard tumor. We then decided in order to palliate her and relieve the obstruction that we would attach the distal small bowel to a healthy portion of left colon. These 2 segments of bowel easily approximated each other. They were held in place with 2 2-0 silk stitches. A small opening was made on the antimesenteric surface of each limb of the small bowel or colon. The contents of the small bowel were milked back to this opening and evacuated using a pool sucker. Next each limb of a GIA-75 stapler was placed down the appropriate limb of small bowel or colon, clamped, and fired thereby creating a nice widely patent enteroenterostomy. The common opening was closed with a TA 60 stapler. 2 crotch stitches of 2-0 silk were placed. There were several other areas of metastatic disease identified in the small bowel mesentery and wall of the small bowel but no other areas of obstruction were identified. Because of the amount of disease present we also decided to place a gastrostomy tube. A 26 French Malencrodt drain was chosen. A 2-0 silk pursestring stitch was placed on the anterior wall of the stomach. A small opening was created with the cautery in the center of the pursestring stitch. The Malencrodt drain was placed through this opening into the lumen  of the stomach. The pursestring stitch was cinched down and tied. A stab incision was made in the left upper quadrant and a tonsil clamp was placed through this opening and used to bring the end of the drain through the abdominal wall. The stomach was anchored in 4 places around the exit site of the drain to the abdominal wall. The  drain was flushed and it flushed easily and then it was aspirated and there were clear stomach contents. The drain was anchored to the skin with a 3-0 nylon stitch. The abdomen was then irrigated with copious amounts of saline. The fascia of the anterior abdominal wall was then closed with multiple interrupted figure-of-eight #1 Novafil stitches. The subcutaneous tissue was irrigated with copious amounts of saline. The skin was closed with staples. Sterile dressings were applied. The patient tolerated the procedure well. At the end of the case all needle sponge and instrument counts were correct. The patient was then awakened and taken to recovery in stable condition.  PLAN OF CARE: Admit to inpatient   PATIENT DISPOSITION:  PACU - hemodynamically stable.   Delay start of Pharmacological VTE agent (>24hrs) due to surgical blood loss or risk of bleeding: no

## 2015-05-28 NOTE — Progress Notes (Signed)
The patient's case was discussed in GI tumor board and the right pelvic mass which is contributing to SBO and right hydronephrosis was discussed as a potential target for palliative radiation. However, the patient did not tolerate removal of the NG tube and has been scheduled for surgery later today. I will follow-up on the results of surgery to help determine if there is a role for XRT postoperatively, but no role currently. I saw the patient this am and discussed this with her, including the fact that given her acute problems radiation was not an appropriate alternative to surgery.

## 2015-05-28 NOTE — Transfer of Care (Signed)
Immediate Anesthesia Transfer of Care Note  Patient: Ashley Hurley  Procedure(s) Performed: Procedure(s): EXPLORATORY LAPAROTOMY WITH SMALL BOWEL TO LEFT COLON BYPASS (N/A) GASTROSTOMY TUBE WITH 20 MALECOT (N/A)  Patient Location: PACU  Anesthesia Type:General  Level of Consciousness: sedated and Patient remains intubated per anesthesia plan  Airway & Oxygen Therapy: Patient remains intubated per anesthesia plan and Patient placed on Ventilator (see vital sign flow sheet for setting)  Post-op Assessment: Report given to RN and Post -op Vital signs reviewed and stable  Post vital signs: Reviewed and stable  Last Vitals:  Filed Vitals:   05/28/15 1140  BP:   Pulse:   Temp: 36.4 C  Resp:     Complications: Patient re-intubated

## 2015-05-28 NOTE — Anesthesia Preprocedure Evaluation (Signed)
Anesthesia Evaluation  Patient identified by MRN, date of birth, ID band Patient awake    Reviewed: Allergy & Precautions, H&P , NPO status , Patient's Chart, lab work & pertinent test results  Airway Mallampati: II  TM Distance: >3 FB Neck ROM: Full    Dental no notable dental hx. (+) Teeth Intact, Dental Advisory Given   Pulmonary asthma ,  breath sounds clear to auscultation  Pulmonary exam normal       Cardiovascular hypertension, Pt. on medications Rhythm:Regular Rate:Normal     Neuro/Psych negative neurological ROS     GI/Hepatic negative GI ROS, Neg liver ROS,   Endo/Other  negative endocrine ROS  Renal/GU negative Renal ROS  negative genitourinary   Musculoskeletal   Abdominal   Peds  Hematology negative hematology ROS (+)   Anesthesia Other Findings   Reproductive/Obstetrics negative OB ROS                             Anesthesia Physical Anesthesia Plan  ASA: II  Anesthesia Plan: General   Post-op Pain Management:    Induction: Intravenous, Rapid sequence and Cricoid pressure planned  Airway Management Planned: Oral ETT  Additional Equipment:   Intra-op Plan:   Post-operative Plan: Extubation in OR  Informed Consent: I have reviewed the patients History and Physical, chart, labs and discussed the procedure including the risks, benefits and alternatives for the proposed anesthesia with the patient or authorized representative who has indicated his/her understanding and acceptance.   Dental advisory given  Plan Discussed with: CRNA  Anesthesia Plan Comments:         Anesthesia Quick Evaluation

## 2015-05-28 NOTE — Progress Notes (Signed)
eLink Physician-Brief Progress Note Patient Name: Ashley Hurley DOB: 1950/12/07 MRN: [id number]   Date of Service  05/28/2015  HPI/Events of Note  Hypotension, tachycardia Received slightly less than 2 L in OR  eICU Interventions  500cc saline bolus Neo PCCM to consult     Intervention Category Intermediate Interventions: Hypotension - evaluation and management  Wilna Pennie 05/28/2015, 7:35 PM

## 2015-05-29 ENCOUNTER — Inpatient Hospital Stay (HOSPITAL_COMMUNITY): Payer: BC Managed Care – PPO

## 2015-05-29 ENCOUNTER — Encounter (HOSPITAL_COMMUNITY): Payer: Self-pay | Admitting: General Surgery

## 2015-05-29 LAB — BASIC METABOLIC PANEL
Anion gap: 6 (ref 5–15)
BUN: 15 mg/dL (ref 6–20)
CO2: 20 mmol/L — ABNORMAL LOW (ref 22–32)
Calcium: 7.2 mg/dL — ABNORMAL LOW (ref 8.9–10.3)
Chloride: 109 mmol/L (ref 101–111)
Creatinine, Ser: 1.11 mg/dL — ABNORMAL HIGH (ref 0.44–1.00)
GFR calc non Af Amer: 51 mL/min — ABNORMAL LOW (ref >60)
GFR, EST AFRICAN AMERICAN: 59 mL/min — AB (ref >60)
Glucose, Bld: 256 mg/dL — ABNORMAL HIGH (ref 65–99)
POTASSIUM: 3.7 mmol/L (ref 3.5–5.1)
SODIUM: 135 mmol/L (ref 135–145)

## 2015-05-29 LAB — CBC
HCT: 22 % — ABNORMAL LOW (ref 36.0–46.0)
Hemoglobin: 7.5 g/dL — ABNORMAL LOW (ref 12.0–15.0)
MCH: 31.6 pg (ref 26.0–34.0)
MCHC: 34.1 g/dL (ref 30.0–36.0)
MCV: 92.8 fL (ref 78.0–100.0)
PLATELETS: 315 10*3/uL (ref 150–400)
RBC: 2.37 MIL/uL — ABNORMAL LOW (ref 3.87–5.11)
RDW: 15.7 % — AB (ref 11.5–15.5)
WBC: 15 10*3/uL — AB (ref 4.0–10.5)

## 2015-05-29 LAB — URINE MICROSCOPIC-ADD ON

## 2015-05-29 LAB — URINALYSIS, ROUTINE W REFLEX MICROSCOPIC
Bilirubin Urine: NEGATIVE
GLUCOSE, UA: NEGATIVE mg/dL
KETONES UR: NEGATIVE mg/dL
Leukocytes, UA: NEGATIVE
Nitrite: NEGATIVE
PROTEIN: 30 mg/dL — AB
Specific Gravity, Urine: 1.008 (ref 1.005–1.030)
Urobilinogen, UA: 0.2 mg/dL (ref 0.0–1.0)
pH: 6 (ref 5.0–8.0)

## 2015-05-29 LAB — CG4 I-STAT (LACTIC ACID): LACTIC ACID, VENOUS: 1.81 mmol/L (ref 0.5–2.0)

## 2015-05-29 LAB — CBC WITH DIFFERENTIAL/PLATELET
Basophils Absolute: 0 10*3/uL (ref 0.0–0.1)
Basophils Relative: 0 % (ref 0–1)
Eosinophils Absolute: 0 10*3/uL (ref 0.0–0.7)
Eosinophils Relative: 0 % (ref 0–5)
HCT: 24.4 % — ABNORMAL LOW (ref 36.0–46.0)
HEMOGLOBIN: 8.4 g/dL — AB (ref 12.0–15.0)
Lymphocytes Relative: 8 % — ABNORMAL LOW (ref 12–46)
Lymphs Abs: 1.1 10*3/uL (ref 0.7–4.0)
MCH: 31.3 pg (ref 26.0–34.0)
MCHC: 34.4 g/dL (ref 30.0–36.0)
MCV: 91 fL (ref 78.0–100.0)
MONO ABS: 0.9 10*3/uL (ref 0.1–1.0)
Monocytes Relative: 7 % (ref 3–12)
NEUTROS ABS: 11.4 10*3/uL — AB (ref 1.7–7.7)
NEUTROS PCT: 85 % — AB (ref 43–77)
Platelets: 260 10*3/uL (ref 150–400)
RBC: 2.68 MIL/uL — AB (ref 3.87–5.11)
RDW: 15.5 % (ref 11.5–15.5)
WBC: 13.4 10*3/uL — ABNORMAL HIGH (ref 4.0–10.5)

## 2015-05-29 LAB — GLUCOSE, CAPILLARY
GLUCOSE-CAPILLARY: 236 mg/dL — AB (ref 65–99)
GLUCOSE-CAPILLARY: 187 mg/dL — AB (ref 65–99)
Glucose-Capillary: 150 mg/dL — ABNORMAL HIGH (ref 65–99)
Glucose-Capillary: 155 mg/dL — ABNORMAL HIGH (ref 65–99)

## 2015-05-29 LAB — CORTISOL: Cortisol, Plasma: 11.3 ug/dL

## 2015-05-29 LAB — TROPONIN I: Troponin I: 0.04 ng/mL — ABNORMAL HIGH (ref <0.031)

## 2015-05-29 LAB — MAGNESIUM: MAGNESIUM: 1.9 mg/dL (ref 1.7–2.4)

## 2015-05-29 LAB — OSMOLALITY, URINE: Osmolality, Ur: 294 mOsm/kg — ABNORMAL LOW (ref 390–1090)

## 2015-05-29 LAB — SODIUM, URINE, RANDOM: Sodium, Ur: 18 mmol/L

## 2015-05-29 MED ORDER — POTASSIUM CHLORIDE IN NACL 20-0.45 MEQ/L-% IV SOLN
INTRAVENOUS | Status: DC
  Administered 2015-05-29: 20:00:00 via INTRAVENOUS
  Filled 2015-05-29 (×4): qty 1000

## 2015-05-29 MED ORDER — CETYLPYRIDINIUM CHLORIDE 0.05 % MT LIQD
7.0000 mL | Freq: Two times a day (BID) | OROMUCOSAL | Status: DC
  Administered 2015-05-29 – 2015-06-06 (×11): 7 mL via OROMUCOSAL

## 2015-05-29 MED ORDER — CHLORHEXIDINE GLUCONATE 0.12 % MT SOLN
15.0000 mL | Freq: Two times a day (BID) | OROMUCOSAL | Status: DC
  Administered 2015-05-29 – 2015-06-08 (×19): 15 mL via OROMUCOSAL
  Filled 2015-05-29 (×18): qty 15

## 2015-05-29 MED ORDER — SODIUM CHLORIDE 0.9 % IV BOLUS (SEPSIS)
1000.0000 mL | Freq: Once | INTRAVENOUS | Status: AC
  Administered 2015-05-29: 1000 mL via INTRAVENOUS

## 2015-05-29 MED ORDER — SODIUM CHLORIDE 0.9 % IV BOLUS (SEPSIS)
500.0000 mL | Freq: Once | INTRAVENOUS | Status: AC
  Administered 2015-05-29: 500 mL via INTRAVENOUS

## 2015-05-29 MED ORDER — TRACE MINERALS CR-CU-F-FE-I-MN-MO-SE-ZN IV SOLN
INTRAVENOUS | Status: AC
  Administered 2015-05-29: 17:00:00 via INTRAVENOUS
  Filled 2015-05-29: qty 1992

## 2015-05-29 MED ORDER — POTASSIUM CHLORIDE 10 MEQ/50ML IV SOLN
10.0000 meq | INTRAVENOUS | Status: AC
  Administered 2015-05-29 (×4): 10 meq via INTRAVENOUS
  Filled 2015-05-29 (×4): qty 50

## 2015-05-29 MED ORDER — MAGNESIUM SULFATE 2 GM/50ML IV SOLN
2.0000 g | Freq: Once | INTRAVENOUS | Status: AC
  Administered 2015-05-29: 2 g via INTRAVENOUS
  Filled 2015-05-29: qty 50

## 2015-05-29 MED ORDER — FAT EMULSION 20 % IV EMUL
240.0000 mL | INTRAVENOUS | Status: AC
  Administered 2015-05-29: 240 mL via INTRAVENOUS
  Filled 2015-05-29: qty 250

## 2015-05-29 NOTE — Progress Notes (Signed)
1 Day Post-Op  Subjective: Feels ok. Wants to go home. Weaning pressor  Objective: Vital signs in last 24 hours: Temp:  [98.1 F (36.7 C)-98.8 F (37.1 C)] 98.1 F (36.7 C) (06/24 1100) Pulse Rate:  [84-137] 94 (06/24 1300) Resp:  [8-31] 13 (06/24 1300) BP: (72-110)/(42-79) 87/54 mmHg (06/24 1300) SpO2:  [95 %-99 %] 96 % (06/24 1300) Weight:  [68.266 kg (150 lb 8 oz)] 68.266 kg (150 lb 8 oz) (06/24 0500) Last BM Date: 05/27/15  Intake/Output from previous day: 06/23 0701 - 06/24 0700 In: 7519 [I.V.:5689; IV Piggyback:850; TPN:980] Out: 2155 [Urine:1105; Emesis/NG output:150; Drains:800; Blood:100] Intake/Output this shift: Total I/O In: 1845.7 [I.V.:1285.7; IV Piggyback:150; TPN:410] Out: 665 [Urine:665]  Resp: clear to auscultation bilaterally Cardio: regular rate and rhythm GI: soft, appropriately tender  Lab Results:   Recent Labs  05/29/15 0410 05/29/15 0730  WBC 15.0* 13.4*  HGB 7.5* 8.4*  HCT 22.0* 24.4*  PLT 315 260   BMET  Recent Labs  05/28/15 0500 05/29/15 0410  NA 138 135  K 3.6 3.7  CL 108 109  CO2 23 20*  GLUCOSE 158* 256*  BUN 20 15  CREATININE 1.15* 1.11*  CALCIUM 8.3* 7.2*   PT/INR No results for input(s): LABPROT, INR in the last 72 hours. ABG No results for input(s): PHART, HCO3 in the last 72 hours.  Invalid input(s): PCO2, PO2  Studies/Results: Dg Chest Port 1 View  05/29/2015   CLINICAL DATA:  Atelectasis  EXAM: PORTABLE CHEST - 1 VIEW  COMPARISON:  05/22/2015  FINDINGS: Cardiac shadow is within normal limits. The inspiratory effort is poor when compared with the prior exam. Postsurgical changes are new again noted on the right. Minimal right basilar atelectasis is noted. No focal confluent infiltrate is seen.  IMPRESSION: Minimal right basilar atelectasis.   Electronically Signed   By: Inez Catalina M.D.   On: 05/29/2015 08:17    Anti-infectives: Anti-infectives    Start     Dose/Rate Route Frequency Ordered Stop   05/28/15  0930  cefOXitin (MEFOXIN) 2 g in dextrose 5 % 50 mL IVPB    Comments:  Pharmacy may adjust dosing strength, interval, or rate of medication as needed for optimal therapy for the patient Send with patient on call to the OR.  Anesthesia to complete antibiotic administration <51min prior to incision per [facility].   2 g 100 mL/hr over 30 Minutes Intravenous On call to O.R. 05/28/15 0919 05/28/15 0930   05/28/15 0600  cefOXitin (MEFOXIN) 2 g in dextrose 5 % 50 mL IVPB  Status:  Discontinued    Comments:  Pharmacy may adjust dosing strength, interval, or rate of medication as needed for optimal therapy for the patient Send with patient on call to the OR.  Anesthesia to complete antibiotic administration <17min prior to incision per [facility].   2 g 100 mL/hr over 30 Minutes Intravenous On call to O.R. 05/27/15 1320 05/27/15 1328      Assessment/Plan: s/p Procedure(s): EXPLORATORY LAPAROTOMY WITH SMALL BOWEL TO LEFT COLON BYPASS (N/A) GASTROSTOMY TUBE WITH 26 MALECOT (N/A) Wean pressor as tolerated. may give fluid boluses  Keep g tube to drain for now  LOS: 7 days    TOTH III,Pinky Ravan S 05/29/2015

## 2015-05-29 NOTE — Plan of Care (Signed)
Problem: Phase I Progression Outcomes Goal: Initial discharge plan identified Outcome: Completed/Met Date Met:  05/29/15 Home with significant other-Rosemarie.  ? May need hospice/palliative care upon discharge

## 2015-05-29 NOTE — Anesthesia Postprocedure Evaluation (Signed)
  Anesthesia Post-op Note  Patient: Ashley Hurley  Procedure(s) Performed: Procedure(s): EXPLORATORY LAPAROTOMY WITH SMALL BOWEL TO LEFT COLON BYPASS (N/A) GASTROSTOMY TUBE WITH 85 MALECOT (N/A)  Patient Location: PACU  Anesthesia Type:General  Level of Consciousness: awake and alert   Airway and Oxygen Therapy: Patient Spontanous Breathing  Post-op Pain: mild  Post-op Assessment: Post-op Vital signs reviewed, Patient's Cardiovascular Status Stable and Respiratory Function Stable  Post-op Vital Signs: Reviewed  Filed Vitals:   05/29/15 1700  BP: 84/69  Pulse: 89  Temp:   Resp: 10    Complications: No apparent anesthesia complications

## 2015-05-29 NOTE — Progress Notes (Signed)
[place] NOTE  Pharmacy Consult for TPN Indication: SBO, expected prolonged ileus  Allergies  Allergen Reactions  . Aspirin     Patient Measurements: Height: '5\' 6"'  (167.6 cm) Weight: 150 lb 8 oz (68.266 kg) IBW/kg (Calculated) : 59.3  Vital Signs: Temp: 98.7 F (37.1 C) (06/24 0700) Temp Source: Oral (06/24 0700) BP: 94/61 mmHg (06/24 0700) Pulse Rate: 96 (06/24 0700) Intake/Output from previous day: 06/23 0701 - 06/24 0700 In: 7519 [I.V.:5689; IV Piggyback:850; TPN:980] Out: 2155 [Urine:1105; Emesis/NG output:150; Drains:800; Blood:100] Intake/Output from this shift:    Labs:  Recent Labs  05/27/15 0538 05/28/15 0500  WBC 6.4 4.2  HGB 9.3* 9.2*  HCT 26.9* 26.9*  PLT 215 214     Recent Labs  05/27/15 0538 05/28/15 0500 05/29/15 0410  NA 139 138 135  K 3.4* 3.6 3.7  CL 107 108 109  CO2 22 23 20*  GLUCOSE 118* 158* 256*  BUN '17 20 15  ' CREATININE 1.17* 1.15* 1.11*  CALCIUM 8.1* 8.3* 7.2*  MG  --  1.7 1.9  PHOS  --  2.6  --   PROT  --  4.8*  --   ALBUMIN  --  2.0*  --   AST  --  19  --   ALT  --  18  --   ALKPHOS  --  74  --   BILITOT  --  0.5  --    Estimated Creatinine Clearance: 47.3 mL/min (by C-G formula based on Cr of 1.11).    Recent Labs  05/28/15 1813 05/28/15 2349 05/29/15 0341  GLUCAP 219* 243* 236*    Medical History: Past Medical History  Diagnosis Date  . Cancer     breast  . Hypertension   . Hyperlipidemia   . Allergy   . Chocolate cyst of ovary   . Breast cancer, right breast Jan 2001    [id number] radiation and chemo [place]    Insulin Requirements in the past 24 hours:  9 units sensitive SSI q4h in past 24h  Assessment: 30 yof with presenting 05/22/15 with worsening N/V and abdominal pain. Duration of symptoms 2 months and has lost 11-12 lb in last month. Nutrition notes patient with moderate depletion in body fat, depletions in muscle mass. Patient found to have SBO likely due to pelvic mass  - anticipated prolonged ileus post-op and patient has been NPO x ~5 days inpatient. Pharmacy consulted to initiate TPN for SBO and anticipated prolonged ileus post-op.  Surgeries/Procedures: 6/23: ex-lap w/ SB to L colon bypass - palliative gastrostomy tube  GI: SBO likely due to pelvic mass - anticipated prolonged ileus post-op. Prealbumin 9 on admit. Did not tolerate sips of clears on 6/21. Throwing up bile and having BMs on 6/22. Failed non-operative management - NGT clamped on 6/21, replaced 6/22 with vomiting continuing (148m/24h) + 1059m24h drain output + 10071m4 blood output. Ex-lap w/ SB to L colon bypass (palliative) - gastrostomy tube placement >> ICU bed overnight (hypotension, tachycardia). PPI, zofran  Endo: No DM hx. CBGs increased 187-256 on TPN - probably stress-response related post-OR + TPN rate increase (BG controlled prior to TPN)  Lytes: K up to 3.7 - s/p 2-3? k runs yesterday + K in IVF (goal >/=4 with ileus), Mg up to 1.9 s/p Mg sulfate 2g yesterday (goal >/=2 with ileus), coCa down to 8.7, low CO2, other lytes wnl  Renal: SCr stable 1.11, CrCl~45, UOP 0.7 ml/kg/h. 1/2NS+20K'@75'   Cards: HTN, HLD. Hypotension/tachycardia post-OR. Neo gtt'@50' ,  multiple fluid boluses  Hepatobil: LFTs/alk phos/tbili wnl  Neuro: Anxiety. hydromorphone prn, lorazepam prn  Heme/Onc: Concerning pancreatic mass with liver mets. Hx breast cancer s/p mastectomy. Awaiting biopsy results from 6/20  ID: Afebrile, wbc wnl, no antibiotics 6/17 BCx4>>ngf 6/17 UC>>ngf  Best Practices: [id number] TPN Access: PICC placed 05/27/15 TPN start date: 05/27/15>>  Current Nutrition:  Clinimix E 5/15 '@60'  ml/h + 20% IVFE '@10'  ml/h NPO  Nutritional Goals:  1750-1950 kCal, 90-100 grams of protein per day (per Nutrition assessment)  Plan: - Increase Clinimix E 5/15 to 83 ml/h + 20% IVFE '@10'  ml/h (will provide 100g protein + 1894 Kcal) - TPN will meet 100% of goals - Multivitamin and trace elements daily in  TPN - Add 10 units regular insulin to TPN bag + continue sensitive SSI and monitor CBGs - K runs x4 - Mg sulfate 2g x1 - Decrease IVF rate to 50 ml/h when new TPN bag starts at 1800 - F/u AM labs   Elicia Lamp, PharmD Clinical Pharmacist - Resident Pager [phone] 05/29/2015 8:13 AM

## 2015-05-29 NOTE — Progress Notes (Signed)
CRITICAL VALUE ALERT  Critical value received:  Lactic Acid 2.3 via IStat  Date of notification:  05/28/15  Time of notification:  2244  Critical value read back: N/A  Alerted Dr. Jimmy Footman of this value. No further orders at this time.  Will continue to monitor carefully.

## 2015-05-29 NOTE — Progress Notes (Signed)
[facility] Progress Note Patient Name: Galit Urich DOB: 06-Sep-1951 MRN: [id number]   Date of Service  05/29/2015  HPI/Events of Note  Hypotension with BP of 88/61 - patient has had low BP since arrival from OR.  Has received a total of 2.5 liters of fluid for BP support.  In no resp distress with sats of 97%.  eICU Interventions  Plan: 500 cc NS bolus for BP support.   Consider aline placement to verify BP     Intervention Category Intermediate Interventions: Hypotension - evaluation and management  Vonte Rossin 05/29/2015, 12:08 AM

## 2015-05-30 DIAGNOSIS — N134 Hydroureter: Secondary | ICD-10-CM

## 2015-05-30 DIAGNOSIS — J9811 Atelectasis: Secondary | ICD-10-CM

## 2015-05-30 DIAGNOSIS — J810 Acute pulmonary edema: Secondary | ICD-10-CM

## 2015-05-30 DIAGNOSIS — Z853 Personal history of malignant neoplasm of breast: Secondary | ICD-10-CM

## 2015-05-30 DIAGNOSIS — J811 Chronic pulmonary edema: Secondary | ICD-10-CM | POA: Insufficient documentation

## 2015-05-30 LAB — GLUCOSE, CAPILLARY
GLUCOSE-CAPILLARY: 161 mg/dL — AB (ref 65–99)
GLUCOSE-CAPILLARY: 182 mg/dL — AB (ref 65–99)
GLUCOSE-CAPILLARY: 195 mg/dL — AB (ref 65–99)
Glucose-Capillary: 174 mg/dL — ABNORMAL HIGH (ref 65–99)
Glucose-Capillary: 156 mg/dL — ABNORMAL HIGH (ref 65–99)
Glucose-Capillary: 174 mg/dL — ABNORMAL HIGH (ref 65–99)
Glucose-Capillary: 167 mg/dL — ABNORMAL HIGH (ref 65–99)

## 2015-05-30 LAB — URINALYSIS, ROUTINE W REFLEX MICROSCOPIC
Bilirubin Urine: NEGATIVE
GLUCOSE, UA: NEGATIVE mg/dL
KETONES UR: NEGATIVE mg/dL
LEUKOCYTES UA: NEGATIVE
Nitrite: NEGATIVE
Protein, ur: NEGATIVE mg/dL
Specific Gravity, Urine: 1.005 (ref 1.005–1.030)
Urobilinogen, UA: 0.2 mg/dL (ref 0.0–1.0)
pH: 6 (ref 5.0–8.0)

## 2015-05-30 LAB — BASIC METABOLIC PANEL
Anion gap: 7 (ref 5–15)
BUN: 15 mg/dL (ref 6–20)
CALCIUM: 7.3 mg/dL — AB (ref 8.9–10.3)
CO2: 20 mmol/L — ABNORMAL LOW (ref 22–32)
CREATININE: 0.95 mg/dL (ref 0.44–1.00)
Chloride: 108 mmol/L (ref 101–111)
GFR calc Af Amer: >60 mL/min (ref >60)
Glucose, Bld: 187 mg/dL — ABNORMAL HIGH (ref 65–99)
Potassium: 3.8 mmol/L (ref 3.5–5.1)
SODIUM: 135 mmol/L (ref 135–145)

## 2015-05-30 LAB — PHOSPHORUS: Phosphorus: 1.2 mg/dL — ABNORMAL LOW (ref 2.5–4.6)

## 2015-05-30 LAB — HEMOGLOBIN A1C
Hgb A1c MFr Bld: 6.4 % — ABNORMAL HIGH (ref 4.8–5.6)
Mean Plasma Glucose: 137 mg/dL

## 2015-05-30 LAB — MAGNESIUM: Magnesium: 1.7 mg/dL (ref 1.7–2.4)

## 2015-05-30 LAB — URINE MICROSCOPIC-ADD ON

## 2015-05-30 MED ORDER — POTASSIUM PHOSPHATES 15 MMOLE/5ML IV SOLN
30.0000 mmol | Freq: Once | INTRAVENOUS | Status: AC
  Administered 2015-05-30: 30 mmol via INTRAVENOUS
  Filled 2015-05-30: qty 10

## 2015-05-30 MED ORDER — HYDROCORTISONE NA SUCCINATE PF 100 MG IJ SOLR
50.0000 mg | Freq: Four times a day (QID) | INTRAMUSCULAR | Status: DC
  Administered 2015-05-30 – 2015-05-31 (×4): 50 mg via INTRAVENOUS
  Filled 2015-05-30 (×8): qty 1

## 2015-05-30 MED ORDER — SODIUM CHLORIDE 0.9 % IV BOLUS (SEPSIS)
500.0000 mL | Freq: Once | INTRAVENOUS | Status: AC
  Administered 2015-05-30: 500 mL via INTRAVENOUS

## 2015-05-30 MED ORDER — MAGNESIUM SULFATE 4 GM/100ML IV SOLN
4.0000 g | Freq: Once | INTRAVENOUS | Status: AC
  Administered 2015-05-30: 4 g via INTRAVENOUS
  Filled 2015-05-30: qty 100

## 2015-05-30 MED ORDER — TRACE MINERALS CR-CU-MN-SE-ZN 10-1000-500-60 MCG/ML IV SOLN
INTRAVENOUS | Status: AC
  Administered 2015-05-30: 18:00:00 via INTRAVENOUS
  Filled 2015-05-30: qty 1440

## 2015-05-30 MED ORDER — FAT EMULSION 20 % IV EMUL
240.0000 mL | INTRAVENOUS | Status: AC
  Administered 2015-05-30: 240 mL via INTRAVENOUS
  Filled 2015-05-30: qty 250

## 2015-05-30 MED ORDER — SODIUM CHLORIDE 0.9 % IV BOLUS (SEPSIS)
1000.0000 mL | Freq: Once | INTRAVENOUS | Status: AC
  Administered 2015-05-30: 1000 mL via INTRAVENOUS

## 2015-05-30 NOTE — Consult Note (Signed)
PULMONARY / CRITICAL CARE MEDICINE   Name: Ashley Hurley MRN: [id number] DOB: [birth year]    ADMISSION DATE:  05/22/2015 CONSULTATION DATE:  05/30/2015  REFERRING MD :  Doyle Askew  CHIEF COMPLAINT:  Hypotension post ex-lap  INITIAL PRESENTATION:  64 y.o. F with hx breast CA s/p mastectomy, admitted 6/17 with N/V and abd pain.  Found to have pancreatic mass, liver mass, LAD, SBO.  Failed conservative management for SBO therefor taken to OR 6/23 for ex lap.  Following surgery, she remained hypotensive; therefore, PCCM was consulted.    STUDIES:  CT A/P 6/14 >>> mass centered in pancreatic body suspicious for primary pancreatic carcinoma. Mass in left hepatic lobe suspicious for liver mets.  Right hydroureteronephrosis and distal SBO due to irregular soft tissue mass in right adnexa.  SIGNIFICANT EVENTS: 6/17 - admit. 6/20 - liver biopsy (pathology positive for adenocarcinoma - pancreatobiliary). 6/23 - ex lap with SB to left colon bypass (palliative), gastrostomy tube placement.  SUBJECTIVE:  remains on pressors, high urine output but still pos 3.3 liters last 24 hrs  VITAL SIGNS: Temp:  [98 F (36.7 C)-98.9 F (37.2 C)] 98.7 F (37.1 C) (06/25 1140) Pulse Rate:  [70-114] 99 (06/25 1300) Resp:  [8-27] 15 (06/25 1300) BP: (73-121)/(51-90) 93/51 mmHg (06/25 1300) SpO2:  [87 %-98 %] 96 % (06/25 1300) Weight:  [72.031 kg (158 lb 12.8 oz)] 72.031 kg (158 lb 12.8 oz) (06/25 0445) HEMODYNAMICS: CVP:  [5 mmHg-22 mmHg] 7 mmHg VENTILATOR SETTINGS:   INTAKE / OUTPUT: Intake/Output      06/24 0701 - 06/25 0700 06/25 0701 - 06/26 0700   I.V. (mL/kg) 4287.3 (59.5) 620 (8.6)   IV Piggyback 1250 100   TPN 1747.1 332   Total Intake(mL/kg) 7284.4 (101.1) 1052 (14.6)   Urine (mL/kg/hr) 2660 (1.5) 1075 (2.1)   Emesis/NG output     Drains 1450 (0.8) 300 (0.6)   Blood     Total Output 4110 1375   Net +3174.4 -323          PHYSICAL EXAMINATION: General: Pleasant adult female, in NAD. Neuro:  A&O x 3, non-focal.  HEENT: PERRL, jvd present Cardiovascular: mild Tachy, regular, no M/R/G.  Lungs: CTA bilaterally Abdomen: Abdominal midline incision dressings C/D/I, gastrostomy tube in place Musculoskeletal: No gross deformities, no edema.  Skin: Intact, warm, no rashes.  LABS:  CBC  Recent Labs Lab 05/28/15 0500 05/29/15 0410 05/29/15 0730  WBC 4.2 15.0* 13.4*  HGB 9.2* 7.5* 8.4*  HCT 26.9* 22.0* 24.4*  PLT 214 315 260   Coag's  Recent Labs Lab 05/24/15 0351  INR 1.32   BMET  Recent Labs Lab 05/28/15 0500 05/29/15 0410 05/30/15 0441  NA 138 135 135  K 3.6 3.7 3.8  CL 108 109 108  CO2 23 20* 20*  BUN 20 15 15   CREATININE 1.15* 1.11* 0.95  GLUCOSE 158* 256* 187*   Electrolytes  Recent Labs Lab 05/28/15 0500 05/29/15 0410 05/30/15 0441  CALCIUM 8.3* 7.2* 7.3*  MG 1.7 1.9 1.7  PHOS 2.6  --  1.2*   Sepsis Markers  Recent Labs Lab 05/28/15 2244 05/29/15 0814  LATICACIDVEN 2.30* 1.81   ABG No results for input(s): PHART, PCO2ART, PO2ART in the last 168 hours. Liver Enzymes  Recent Labs Lab 05/28/15 0500  AST 19  ALT 18  ALKPHOS 74  BILITOT 0.5  ALBUMIN 2.0*   Cardiac Enzymes  Recent Labs Lab 05/29/15 0730  TROPONINI 0.04*   Glucose  Recent Labs Lab 05/29/15 1543 05/29/15  1953 05/30/15 0008 05/30/15 0430 05/30/15 0757 05/30/15 1138  GLUCAP 150* 155* 161* 182* 156* 174*    Imaging No results found.   ASSESSMENT / PLAN:  CARDIOVASCULAR RUE PICC 6/22 >>> A:  Hypotension post op - likely due to hypovolemia. Hx HTN, HLD. Rel AI P:  cvp 6, may consider repeat bolus and assess response as we assess urine studies to prove her high urine output is iatrogenic? Neosynephrine to map goal 55 (high urine output, la clearance, mS good) Repeat lactate re assuring of perfusion Needs stress roids  GASTROINTESTINAL A:   SBO - s/p ex lap 6/23 with SB to left colon bypass (palliative), gastrostomy tube placement (Dr.  Marlou Starks). Pancreatic mass - unresectable per Dr. Marlou Starks. Protein calorie malnutrition. GERD. Nutrition. P:   Post op care per [facility]. Pantoprazole. Continue TPN.  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 Chronic anemia. VTE Prophylaxis. P:  SCD's / Lovenox. CBC in AM.  PULMONARY A: Acute hypoxic respiratory failure. At risk post op atelectasis. P:   O2 to RA pcxr for edema risk as we bolus further  RENAL A:   AKI. Pseudohypocalcemia - corrects to 9.9. Right hydroureteronephrosis - due to tissue mass in right lower pelvis. hypomag hypophos High urine output (likley iatrogenic) P:   bmet in am  UA, urine na, osm Bolus cvp follow up Phos, mag supp  INFECTIOUS A:   Surgical prophylaxis. P:   No fevers  ENDOCRINE A:   Hyperglycemia. Rel AI P:   SSI. Needs stress roids  NEUROLOGIC A:   Pain post ex lap. Hx Anxiety. P:   Dilaudid PRN, Morphine PRN. Ativan PRN.  Family updated: None.  Interdisciplinary Family Meeting v Palliative Care Meeting:  Due by: 6/29.  CC time:  30 min.  Lavon Paganini. Titus Mould, MD, [place] Pgr: [facility]

## 2015-05-30 NOTE — Progress Notes (Signed)
[place] NOTE  Pharmacy Consult for TPN Indication: SBO, expected prolonged ileus  Allergies  Allergen Reactions  . Aspirin     Patient Measurements: Height: '5\' 6"'  (167.6 cm) Weight: 158 lb 12.8 oz (72.031 kg) IBW/kg (Calculated) : 59.3  Vital Signs: Temp: 98.7 F (37.1 C) (06/25 0400) Temp Source: Oral (06/25 0400) BP: 103/90 mmHg (06/25 0700) Pulse Rate: 91 (06/25 0700) Intake/Output from previous day: 06/24 0701 - 06/25 0700 In: 7284.4 [I.V.:4287.3; IV Piggyback:1250; TPN:1747.1] Out: 4110 [Urine:2660; Drains:1450] Intake/Output from this shift:    Labs:  Recent Labs  05/28/15 0500 05/29/15 0410 05/29/15 0730  WBC 4.2 15.0* 13.4*  HGB 9.2* 7.5* 8.4*  HCT 26.9* 22.0* 24.4*  PLT 214 315 260     Recent Labs  05/28/15 0500 05/29/15 0410 05/30/15 0441  NA 138 135 135  K 3.6 3.7 3.8  CL 108 109 108  CO2 23 20* 20*  GLUCOSE 158* 256* 187*  BUN '20 15 15  ' CREATININE 1.15* 1.11* 0.95  CALCIUM 8.3* 7.2* 7.3*  MG 1.7 1.9 1.7  PHOS 2.6  --  1.2*  PROT 4.8*  --   --   ALBUMIN 2.0*  --   --   AST 19  --   --   ALT 18  --   --   ALKPHOS 74  --   --   BILITOT 0.5  --   --    Estimated Creatinine Clearance: 60 mL/min (by C-G formula based on Cr of 0.95).    Recent Labs  05/29/15 1953 05/30/15 0008 05/30/15 0430  GLUCAP 155* 161* 182*    Medical History: Past Medical History  Diagnosis Date  . Cancer     breast  . Hypertension   . Hyperlipidemia   . Allergy   . Chocolate cyst of ovary   . Breast cancer, right breast Jan 2001    [id number] radiation and chemo [place]    Insulin Requirements in the past 24 hours:  11 units sensitive SSI + 10 units regular insulin in TPN bag  Assessment: 82 yof with presenting 05/22/15 with worsening N/V and abdominal pain. Duration of symptoms 2 months and has lost 11-12 lb in last month. Nutrition notes patient with moderate depletion in body fat, depletions in muscle mass. Patient found  to have SBO likely due to pelvic mass - anticipated prolonged ileus post-op and patient has been NPO x ~5 days inpatient. Pharmacy consulted to initiate TPN for SBO and anticipated prolonged ileus post-op.  Surgeries/Procedures: 6/23: ex-lap w/ SB to L colon bypass - palliative gastrostomy tube  GI: SBO likely due to pelvic mass - anticipated prolonged ileus post-op. Prealbumin 9 on admit. Did not tolerate sips of clears on 6/21. Throwing up bile and having BMs on 6/22. Failed non-operative management - NGT clamped on 6/21, replaced 6/22 with continued vomiting. 6/23: Ex-lap w/ SB to L colon bypass (palliative) - gastrostomy tube placement. 1420m of output from drain in 24h. Gtube now to gravity.   Endo: No DM hx. CBGs increased 150-187 on TPN - probably stress-response related post-OR + TPN rate increase (BG controlled prior to TPN)  Lytes: K 3.8 - s/p 4 runs yesterday + K in IVF (goal >/=4 with ileus), Mag dropped to 1.7 despite receiving 2g of Mag sulfate yesterday (goal >/=2 with ileus), corCa 8.7, phos dropped to 1.2 (was 2.6 on 6/23)- with drop in lytes, patient likely exhibiting some signs of refeeding with TPN rate increase  Renal: SCr 0.95,  CrCl~45, UOP 1.5 ml/kg/h. 1/2NS+20K'@50'   Cards: HTN, HLD. Hypotension/tachycardia post-OR. Neo gtt'@70' - BP still soft, HR ok but can get tachy  Hepatobil: LFTs/alk phos/tbili wnl  Neuro: Anxiety. hydromorphone prn, lorazepam prn  Heme/Onc: Concerning pancreatic mass with liver mets. Hx breast cancer s/p mastectomy. Awaiting biopsy results from 6/20. Hgb 8.4, plts 260.  ID: Afebrile, wbc wnl, no antibiotics 6/17 BCx4>>ngf 6/17 UC>>ngf  Best Practices: enox40, MC, IV PPI TPN Access: PICC placed 05/27/15 TPN start date: 05/27/15>>  Current Nutrition:  Clinimix E 5/15 '@83'  ml/h + 20% IVFE '@10'  ml/h NPO  Nutritional Goals:  1750-1950 kCal, 90-100 grams of protein per day (per Nutrition assessment)  Plan: - Decrease Clinimix E 5/15 back down to  40m/hr as patient is exhibiting signs of refeeding. Can continue 20% IVFE at 10 ml/h. This will provide 72g protein and 1502 kcal which meets 80% of protein goals and 85% of kcal goals. Will advance TPN slowly - Multivitamin and trace elements daily in TPN - continue 10 units regular insulin in TPN bag despite rate decrease since CBGs were elevated on this rate previously. Will also continue sensitive SSI. Monitor CBGs - KPhos 317ml IV x1 (will provide 45103mof potassium) - Mag sulfate 4g IV x1 - Continue 1/2NS with 53m14mCl at 50mL53m- BMET, mag, and phos in the morning  Reika Callanan D. Chasitty Hehl, PharmD, BCPS Clinical Pharmacist Pager: [phone]/2016 7:43 AM

## 2015-05-30 NOTE — Progress Notes (Signed)
Family asking about oncology follow up. Radiation oncologist Dr. Lisbeth Renshaw saw pt on 6/23, currently no role for rad tx. Pt was presented on tumor board last week. I spoke with Dr. Julien Nordmann who advised me to call cancer center on Monday to discuss with oncology coordinator Miss Manuela Schwartz. Family made aware.Marland Kitchen  Faye Ramsay, MD  [facility] Pager [phone] Cell [phone]  If 7PM-7AM, please contact night-coverage [email or web address] Password [email or web address]

## 2015-05-30 NOTE — Progress Notes (Signed)
Chaplain responded to page from RN that pt's family wished to speak to chaplain.  Met pt briefly then spoke with pt's spouse in hallway.  Pt and spouse wish for more attention to be paid to making sure spouse is present when MD's are meeting with pt.  Spouse shares that pt is taking some news with extreme emotional response and would like to meet with MD's prior to delivering "bad" news to pt.  Pt and spouse are indeed legally married and spouse also shares having a prior HCPOA document which will be brought to hospital for record.  Chaplain provided emotional support as well as pt and family advocacy.  Chaplain available to follow up as needed.    05/30/15 1500  Clinical Encounter Type  Visited With Patient and family together  Visit Type Initial;Social support  Referral From Family;Nurse  Spiritual Encounters  Spiritual Needs Emotional  Stress Factors  Patient Stress Factors Health changes  Family Stress Factors Lack of knowledge   Geralyn Flash 05/30/2015 3:53 PM

## 2015-05-30 NOTE — Progress Notes (Signed)
2 Days Post-Op  Subjective: No complaints. Wants to get out of icu  Objective: Vital signs in last 24 hours: Temp:  [98 F (36.7 C)-98.9 F (37.2 C)] 98.7 F (37.1 C) (06/25 0400) Pulse Rate:  [70-114] 91 (06/25 0700) Resp:  [8-31] 18 (06/25 0700) BP: (81-121)/(50-90) 103/90 mmHg (06/25 0700) SpO2:  [87 %-100 %] 98 % (06/25 0700) Weight:  [72.031 kg (158 lb 12.8 oz)] 72.031 kg (158 lb 12.8 oz) (06/25 0445) Last BM Date: 05/27/15  Intake/Output from previous day: 06/24 0701 - 06/25 0700 In: 7284.4 [I.V.:4287.3; IV Piggyback:1250; TPN:1747.1] Out: 4110 [Urine:2660; Drains:1450] Intake/Output this shift:    Resp: clear to auscultation bilaterally Cardio: regular rate and rhythm GI: soft, minimal tenderness. quiet. incision looks good. g tube to drain  Lab Results:   Recent Labs  05/29/15 0410 05/29/15 0730  WBC 15.0* 13.4*  HGB 7.5* 8.4*  HCT 22.0* 24.4*  PLT 315 260   BMET  Recent Labs  05/29/15 0410 05/30/15 0441  NA 135 135  K 3.7 3.8  CL 109 108  CO2 20* 20*  GLUCOSE 256* 187*  BUN 15 15  CREATININE 1.11* 0.95  CALCIUM 7.2* 7.3*   PT/INR No results for input(s): LABPROT, INR in the last 72 hours. ABG No results for input(s): PHART, HCO3 in the last 72 hours.  Invalid input(s): PCO2, PO2  Studies/Results: Dg Chest Port 1 View  05/29/2015   CLINICAL DATA:  Atelectasis  EXAM: PORTABLE CHEST - 1 VIEW  COMPARISON:  05/22/2015  FINDINGS: Cardiac shadow is within normal limits. The inspiratory effort is poor when compared with the prior exam. Postsurgical changes are new again noted on the right. Minimal right basilar atelectasis is noted. No focal confluent infiltrate is seen.  IMPRESSION: Minimal right basilar atelectasis.   Electronically Signed   By: Inez Catalina M.D.   On: 05/29/2015 08:17    Anti-infectives: Anti-infectives    Start     Dose/Rate Route Frequency Ordered Stop   05/28/15 0930  cefOXitin (MEFOXIN) 2 g in dextrose 5 % 50 mL IVPB     Comments:  Pharmacy may adjust dosing strength, interval, or rate of medication as needed for optimal therapy for the patient Send with patient on call to the OR.  Anesthesia to complete antibiotic administration <57min prior to incision per [facility].   2 g 100 mL/hr over 30 Minutes Intravenous On call to O.R. 05/28/15 0919 05/28/15 0930   05/28/15 0600  cefOXitin (MEFOXIN) 2 g in dextrose 5 % 50 mL IVPB  Status:  Discontinued    Comments:  Pharmacy may adjust dosing strength, interval, or rate of medication as needed for optimal therapy for the patient Send with patient on call to the OR.  Anesthesia to complete antibiotic administration <108min prior to incision per [facility].   2 g 100 mL/hr over 30 Minutes Intravenous On call to O.R. 05/27/15 1320 05/27/15 1328      Assessment/Plan: s/p Procedure(s): EXPLORATORY LAPAROTOMY WITH SMALL BOWEL TO LEFT COLON BYPASS (N/A) GASTROSTOMY TUBE WITH 26 MALECOT (N/A) Wean neo as bp tolerates  Continue g tube and bowel rest until bowel function returns Will stay in icu until off pressor OOB Continue tpn for nutritional support for now Started to address the idea of palliative care but she does not seem ready for this yet  LOS: 8 days    TOTH III,PAUL S 05/30/2015

## 2015-05-31 ENCOUNTER — Inpatient Hospital Stay (HOSPITAL_COMMUNITY): Payer: BC Managed Care – PPO

## 2015-05-31 LAB — GLUCOSE, CAPILLARY
GLUCOSE-CAPILLARY: 187 mg/dL — AB (ref 65–99)
GLUCOSE-CAPILLARY: 170 mg/dL — AB (ref 65–99)
Glucose-Capillary: 164 mg/dL — ABNORMAL HIGH (ref 65–99)
Glucose-Capillary: 165 mg/dL — ABNORMAL HIGH (ref 65–99)
Glucose-Capillary: 189 mg/dL — ABNORMAL HIGH (ref 65–99)
Glucose-Capillary: 203 mg/dL — ABNORMAL HIGH (ref 65–99)
Glucose-Capillary: 204 mg/dL — ABNORMAL HIGH (ref 65–99)

## 2015-05-31 LAB — COMPREHENSIVE METABOLIC PANEL
ALT: 46 U/L (ref 14–54)
AST: 57 U/L — AB (ref 15–41)
Albumin: 1.2 g/dL — ABNORMAL LOW (ref 3.5–5.0)
Alkaline Phosphatase: 111 U/L (ref 38–126)
Anion gap: 4 — ABNORMAL LOW (ref 5–15)
BUN: 16 mg/dL (ref 6–20)
CALCIUM: 7.4 mg/dL — AB (ref 8.9–10.3)
CO2: 22 mmol/L (ref 22–32)
CREATININE: 0.89 mg/dL (ref 0.44–1.00)
Chloride: 112 mmol/L — ABNORMAL HIGH (ref 101–111)
GFR calc Af Amer: >60 mL/min (ref >60)
GFR calc non Af Amer: >60 mL/min (ref >60)
Glucose, Bld: 201 mg/dL — ABNORMAL HIGH (ref 65–99)
Potassium: 4.2 mmol/L (ref 3.5–5.1)
Sodium: 138 mmol/L (ref 135–145)
Total Bilirubin: 1.9 mg/dL — ABNORMAL HIGH (ref 0.3–1.2)
Total Protein: 4 g/dL — ABNORMAL LOW (ref 6.5–8.1)

## 2015-05-31 LAB — CBC WITH DIFFERENTIAL/PLATELET
BASOS ABS: 0 10*3/uL (ref 0.0–0.1)
Basophils Relative: 0 % (ref 0–1)
Eosinophils Absolute: 0 10*3/uL (ref 0.0–0.7)
Eosinophils Relative: 0 % (ref 0–5)
HEMATOCRIT: 22.2 % — AB (ref 36.0–46.0)
Hemoglobin: 7.6 g/dL — ABNORMAL LOW (ref 12.0–15.0)
LYMPHS ABS: 0.6 10*3/uL — AB (ref 0.7–4.0)
Lymphocytes Relative: 7 % — ABNORMAL LOW (ref 12–46)
MCH: 31.4 pg (ref 26.0–34.0)
MCHC: 34.2 g/dL (ref 30.0–36.0)
MCV: 91.7 fL (ref 78.0–100.0)
Monocytes Absolute: 0.8 10*3/uL (ref 0.1–1.0)
Monocytes Relative: 9 % (ref 3–12)
NEUTROS ABS: 7.4 10*3/uL (ref 1.7–7.7)
Neutrophils Relative %: 84 % — ABNORMAL HIGH (ref 43–77)
Platelets: 190 10*3/uL (ref 150–400)
RBC: 2.42 MIL/uL — ABNORMAL LOW (ref 3.87–5.11)
RDW: 15.8 % — ABNORMAL HIGH (ref 11.5–15.5)
WBC Morphology: INCREASED
WBC: 8.8 10*3/uL (ref 4.0–10.5)

## 2015-05-31 LAB — MAGNESIUM: MAGNESIUM: 2.2 mg/dL (ref 1.7–2.4)

## 2015-05-31 LAB — PHOSPHORUS: PHOSPHORUS: 2.9 mg/dL (ref 2.5–4.6)

## 2015-05-31 MED ORDER — HYDROCORTISONE NA SUCCINATE PF 100 MG IJ SOLR
25.0000 mg | Freq: Four times a day (QID) | INTRAMUSCULAR | Status: DC
  Administered 2015-05-31 – 2015-06-01 (×5): 25 mg via INTRAVENOUS
  Filled 2015-05-31 (×2): qty 2
  Filled 2015-05-31: qty 0.5
  Filled 2015-05-31 (×2): qty 2
  Filled 2015-05-31: qty 0.5
  Filled 2015-05-31: qty 2
  Filled 2015-05-31: qty 0.5

## 2015-05-31 MED ORDER — TRACE MINERALS CR-CU-MN-SE-ZN 10-1000-500-60 MCG/ML IV SOLN
INTRAVENOUS | Status: AC
  Administered 2015-05-31: 18:00:00 via INTRAVENOUS
  Filled 2015-05-31: qty 1680

## 2015-05-31 MED ORDER — FAT EMULSION 20 % IV EMUL
240.0000 mL | INTRAVENOUS | Status: AC
  Administered 2015-05-31: 240 mL via INTRAVENOUS
  Filled 2015-05-31: qty 250

## 2015-05-31 NOTE — Progress Notes (Signed)
Per patient, she would like her significant other, [name] to be included in her care.  Patient only wants to know "certain details" of what is going on and wants the doctors to only tell her what she wants to know. She would like [name] to know everything and then she will let [name] decide what else she should know at that time.

## 2015-05-31 NOTE — Progress Notes (Addendum)
PULMONARY / CRITICAL CARE MEDICINE   Name: Ashley Hurley MRN: [id number] DOB: May 24, 1951    ADMISSION DATE:  05/22/2015 CONSULTATION DATE:  05/31/2015  REFERRING MD :  Doyle Askew  CHIEF COMPLAINT:  Hypotension post ex-lap  INITIAL PRESENTATION:    64 y.o. F with hx breast CA s/p mastectomy, admitted 6/17 with N/V and abd pain.  Found to have pancreatic mass, liver mass, LAD, SBO.  Failed conservative management for SBO therefor taken to OR 6/23 for ex lap.  Following surgery, she remained hypotensive; therefore, PCCM was consulted.    STUDIES:  CT A/P 6/14 >>> mass centered in pancreatic body suspicious for primary pancreatic carcinoma. Mass in left hepatic lobe suspicious for liver mets.  Right hydroureteronephrosis and distal SBO due to irregular soft tissue mass in right adnexa.  SIGNIFICANT EVENTS: 6/17 - admit. 6/20 - liver biopsy (pathology positive for adenocarcinoma - pancreatobiliary). 6/23 - ex lap with SB to left colon bypass (palliative), gastrostomy tube placement.  SUBJECTIVE:  Off pressors yeah!!!   VITAL SIGNS: Temp:  [97.3 F (36.3 C)-98.2 F (36.8 C)] 97.8 F (36.6 C) (06/26 1159) Pulse Rate:  [73-109] 78 (06/26 1200) Resp:  [8-32] 21 (06/26 1200) BP: (69-115)/(37-85) 90/75 mmHg (06/26 1200) SpO2:  [92 %-97 %] 95 % (06/26 1200) HEMODYNAMICS: CVP:  [4 mmHg-7 mmHg] 4 mmHg VENTILATOR SETTINGS:   INTAKE / OUTPUT: Intake/Output      06/25 0701 - 06/26 0700 06/26 0701 - 06/27 0700   I.V. (mL/kg) 2548.8 (35.4) 250 (3.5)   IV Piggyback 610    TPN 1961.5 350   Total Intake(mL/kg) 5120.3 (71.1) 600 (8.3)   Urine (mL/kg/hr) 4440 (2.6) 325 (0.7)   Drains 675 (0.4) 200 (0.4)   Total Output 5115 525   Net +5.3 +75          PHYSICAL EXAMINATION: General: Pleasant adult female, in NAD. Neuro: A&O x 3, non-focal.  HEENT: PERRL, no JVD  Cardiovascular: mild Tachy, regular, no M/R/G.  Lungs: CTA bilaterally Abdomen: Abdominal midline incision dressings C/D/I,  gastrostomy tube in place. abd soft, tender, no bs still Musculoskeletal: No gross deformities, no edema.  Skin: Intact, warm, no rashes.  LABS:  CBC  Recent Labs Lab 05/29/15 0410 05/29/15 0730 05/31/15 0428  WBC 15.0* 13.4* 8.8  HGB 7.5* 8.4* 7.6*  HCT 22.0* 24.4* 22.2*  PLT 315 260 190   Coag's No results for input(s): APTT, INR in the last 168 hours. BMET  Recent Labs Lab 05/29/15 0410 05/30/15 0441 05/31/15 0428  NA 135 135 138  K 3.7 3.8 4.2  CL 109 108 112*  CO2 20* 20* 22  BUN 15 15 16   CREATININE 1.11* 0.95 0.89  GLUCOSE 256* 187* 201*   Electrolytes  Recent Labs Lab 05/28/15 0500 05/29/15 0410 05/30/15 0441 05/31/15 0428  CALCIUM 8.3* 7.2* 7.3* 7.4*  MG 1.7 1.9 1.7 2.2  PHOS 2.6  --  1.2* 2.9   Sepsis Markers  Recent Labs Lab 05/28/15 2244 05/29/15 0814  LATICACIDVEN 2.30* 1.81   ABG No results for input(s): PHART, PCO2ART, PO2ART in the last 168 hours. Liver Enzymes  Recent Labs Lab 05/28/15 0500 05/31/15 0428  AST 19 57*  ALT 18 46  ALKPHOS 74 111  BILITOT 0.5 1.9*  ALBUMIN 2.0* 1.2*   Cardiac Enzymes  Recent Labs Lab 05/29/15 0730  TROPONINI 0.04*   Glucose  Recent Labs Lab 05/30/15 1613 05/30/15 2009 05/31/15 0022 05/31/15 0422 05/31/15 0747 05/31/15 1154  GLUCAP 167* 195* 204* 203* 164*  187*    Imaging Dg Chest Port 1 View  05/31/2015   CLINICAL DATA:  Pulmonary edema  EXAM: PORTABLE CHEST - 1 VIEW  COMPARISON:  05/29/2015; 05/22/2015  FINDINGS: Grossly unchanged cardiac silhouette and mediastinal contours. Stable position of support apparatus. Worsening right mid and lower lung heterogeneous/consolidative opacities. Left basilar/retrocardiac opacities are unchanged. No definite pleural effusion or evidence of edema. Unchanged bones.  IMPRESSION: Worsening right mid and lower lung opacities, worrisome for progression of infection.   Electronically Signed   By: Sandi Mariscal M.D.   On: 05/31/2015 07:57      ASSESSMENT / PLAN:  CARDIOVASCULAR RUE PICC 6/22 >>> A:  Hypotension post op - likely due to hypovolemia and adrenal insuff Hx HTN, HLD. Rel AI P:  Cont IVFs keep even status Cont stress dose steroids but reduce as off pressors  GASTROINTESTINAL A:   SBO - s/p ex lap 6/23 with SB to left colon bypass (palliative), gastrostomy tube placement (Dr. Marlou Starks). Pancreatic mass - unresectable per Dr. Marlou Starks. Protein calorie malnutrition. GERD. Nutrition. P:   Post op care per [facility]. Pantoprazole. Continue TPN.  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 Chronic anemia. VTE Prophylaxis. P:  SCD's / Lovenox. CBC in AM.  PULMONARY A: Acute hypoxic respiratory failure-->resolved At risk post op atelectasis. Effusion, ATX rt abse P:   O2 to RA IS Allow neg balance further  RENAL A:   AKI-->resolved Pseudohypocalcemia - corrects to 9.9. Right hydroureteronephrosis - due to tissue mass in right lower pelvis. High urine output (likley iatrogenic) P:   bmet in am    INFECTIOUS A:   Surgical prophylaxis. P:   No fevers  ENDOCRINE A:   Hyperglycemia. Rel AI P:   SSI. Needs stress roids  NEUROLOGIC A:   Pain post ex lap. Hx Anxiety. P:   Dilaudid PRN, Morphine PRN. Ativan PRN.  Family updated: None.  Interdisciplinary Family Meeting v Palliative Care Meeting:  Due by: 6/29.   Stable. Ready to go to medical ward. Cont post-op care and supportive care. Will d/c foley.    STAFF NOTE: I, Merrie Roof, MD FACP have personally reviewed patient's available data, including medical history, events of note, physical examination and test results as part of my evaluation. I have discussed with resident/NP and other care providers such as pharmacist, RN and RRT. In addition, I personally evaluated patient and elicited key findings of: responded to therapy, off pressors  reduce steroids further and taper over 4-7 days to off, allow neg balance and re assess over time the rt base atx / effusion, to [facility] in am , move out of icu, kvo fluids, dc LR with TPN, caution excessive narcs  Lavon Paganini. Titus Mould, MD, [place] Pgr: [place] [facility] 05/31/2015 2:30 PM

## 2015-05-31 NOTE — Progress Notes (Signed)
[place] NOTE  Pharmacy Consult for TPN Indication: SBO, expected prolonged ileus  Allergies  Allergen Reactions  . Aspirin     Patient Measurements: Height: 5\' 6"  (167.6 cm) Weight: 158 lb 12.8 oz (72.031 kg) IBW/kg (Calculated) : 59.3  Vital Signs: Temp: 97.7 F (36.5 C) (06/26 0750) Temp Source: Oral (06/26 0750) BP: 102/70 mmHg (06/26 0700) Pulse Rate: 109 (06/26 0700) Intake/Output from previous day: 06/25 0701 - 06/26 0700 In: 5075.3 [I.V.:2503.8; IV Piggyback:610; TPN:1961.5] Out: 5115 [Urine:4440; Drains:675] Intake/Output from this shift:    Labs:  Recent Labs  05/29/15 0410 05/29/15 0730 05/31/15 0428  WBC 15.0* 13.4* 8.8  HGB 7.5* 8.4* 7.6*  HCT 22.0* 24.4* 22.2*  PLT 315 260 190     Recent Labs  05/29/15 0410 05/30/15 0441 05/31/15 0428  NA 135 135 138  K 3.7 3.8 4.2  CL 109 108 112*  CO2 20* 20* 22  GLUCOSE 256* 187* 201*  BUN 15 15 16   CREATININE 1.11* 0.95 0.89  CALCIUM 7.2* 7.3* 7.4*  MG 1.9 1.7 2.2  PHOS  --  1.2* 2.9  PROT  --   --  4.0*  ALBUMIN  --   --  1.2*  AST  --   --  57*  ALT  --   --  46  ALKPHOS  --   --  111  BILITOT  --   --  1.9*   Estimated Creatinine Clearance: 64.1 mL/min (by C-G formula based on Cr of 0.89).    Recent Labs  05/30/15 2009 05/31/15 0022 05/31/15 0422  GLUCAP 195* 204* 203*   Insulin Requirements in the past 24 hours:  16 units sensitive SSI + 10 units regular insulin in TPN bag  Assessment: 47 yof with presenting 05/22/15 with worsening N/V and abdominal pain. Duration of symptoms 2 months and has lost 11-12 lb in last month. Nutrition notes patient with moderate depletion in body fat, depletions in muscle mass. Patient found to have SBO likely due to pelvic mass - anticipated prolonged ileus post-op and patient has been NPO x ~5 days inpatient. Pharmacy consulted to initiate TPN for SBO and anticipated prolonged ileus post-op.  Surgeries/Procedures: 6/23: ex-lap w/  SB to L colon bypass - palliative gastrostomy tube  GI: SBO likely due to pelvic mass - anticipated prolonged ileus post-op. Prealbumin 9 on admit. Did not tolerate sips of clears on 6/21. Throwing up bile and having BMs on 6/22. Failed non-operative management - NGT clamped on 6/21, replaced 6/22 with continued vomiting. 6/23: Ex-lap w/ SB to L colon bypass (palliative) - gastrostomy tube placement. 77mL of output from drain in 24h (decreased). Gtube to gravity. likely out of ICU today.   Endo: No DM hx, CBGs controlled prior to start of TPN and OR. Now on stress dose steroids (hydrocortisone 50mg  IV q8h). CBGs 156-201 in last 24h  Lytes: patient showed signs of refeeding yesterday and TPN rate was decreased. Also received repletion with mag sulfate 4g IV x1 and 24mmol Kphos IV x1. This morning, K 4.2 (goal >/=4 with ileus), Mag 2.2 (goal >/=2 with ileus), Phos 2.9, corCa 9.7.  Renal: SCr 0.89, CrCl~60-65, UOP 1.5 ml/kg/h. 1/2NS+20K@50   Cards: HTN, HLD. Hypotension/tachycardia post-OR. off Neo gtt- BP still soft, HR ok but can get tachy  Hepatobil: AST up to 57, ALT remains normal. TBili increased to 1.9. Albumin remains low at 1.2. Trigs have not yet been checked, ordered for tomorrow morning  Neuro: Anxiety. hydromorphone prn, lorazepam prn.  Heme/Onc: Concerning pancreatic mass with liver mets. Hx breast cancer s/p mastectomy. Awaiting biopsy results from 6/20. Hgb 7.6, plts 190- both dropped. No overt bleeding noted.  ID: Afebrile, wbc wnl, no antibiotics 6/17 BCx4>>ngf 6/17 UC>>ngf  Best Practices: enox40, MC, IV PPI TPN Access: PICC placed 05/27/15 TPN start date: 05/27/15>>  Current Nutrition:  Clinimix E 5/15 @60  ml/h + 20% IVFE @10  ml/h- provides 72g protein and 1502kcal in 24h period NPO  Nutritional Goals:  1750-1950 kCal, 90-100 grams of protein per day (per Nutrition assessment)  Plan: -Need to advance TPN slowly given patient has already showed signs of refeeding.  Increase Clinimix E 5/15 slightly to 49mL/hr, continue 20% IVFE at 10 ml/h- this will provide 84g protein (93% of goal) and 1672kcal (95% of goal) - Multivitamin and trace elements daily in TPN - Increase regular insulin in TPN bag to 20 units. Will also continue sensitive SSI. Monitor CBGs -Continue 1/2NS with 17mEq KCl at 71mL/hr - TPN labs as ordered  Ashley Hurley D. Stevin Bielinski, PharmD, BCPS Clinical Pharmacist Pager: [phone] 05/31/2015 8:14 AM

## 2015-05-31 NOTE — Progress Notes (Signed)
3 Days Post-Op  Subjective: NO FLATUS OR BM PAIN WELL CONTROLLED OFF NEO WANTS OUT ICU   Objective: Vital signs in last 24 hours: Temp:  [97.3 F (36.3 C)-98.8 F (37.1 C)] 97.7 F (36.5 C) (06/26 0750) Pulse Rate:  [73-110] 109 (06/26 0700) Resp:  [8-32] 23 (06/26 0700) BP: (69-115)/(37-85) 102/70 mmHg (06/26 0700) SpO2:  [92 %-97 %] 94 % (06/26 0700) Last BM Date: 05/27/15  Intake/Output from previous day: 06/25 0701 - 06/26 0700 In: 5075.3 [I.V.:2503.8; IV Piggyback:610; TPN:1961.5] Out: 5115 [Urine:4440; Drains:675] Intake/Output this shift:    Incision/Wound:CDI THROUGH HONEYCOMB DRESSING SLIGHT DISTENTION   Lab Results:   Recent Labs  05/29/15 0730 05/31/15 0428  WBC 13.4* 8.8  HGB 8.4* 7.6*  HCT 24.4* 22.2*  PLT 260 190   BMET  Recent Labs  05/30/15 0441 05/31/15 0428  NA 135 138  K 3.8 4.2  CL 108 112*  CO2 20* 22  GLUCOSE 187* 201*  BUN 15 16  CREATININE 0.95 0.89  CALCIUM 7.3* 7.4*   PT/INR No results for input(s): LABPROT, INR in the last 72 hours. ABG No results for input(s): PHART, HCO3 in the last 72 hours.  Invalid input(s): PCO2, PO2  Studies/Results: No results found.  Anti-infectives: Anti-infectives    Start     Dose/Rate Route Frequency Ordered Stop   05/28/15 0930  cefOXitin (MEFOXIN) 2 g in dextrose 5 % 50 mL IVPB    Comments:  Pharmacy may adjust dosing strength, interval, or rate of medication as needed for optimal therapy for the patient Send with patient on call to the OR.  Anesthesia to complete antibiotic administration <33min prior to incision per [facility].   2 g 100 mL/hr over 30 Minutes Intravenous On call to O.R. 05/28/15 0919 05/28/15 0930   05/28/15 0600  cefOXitin (MEFOXIN) 2 g in dextrose 5 % 50 mL IVPB  Status:  Discontinued    Comments:  Pharmacy may adjust dosing strength, interval, or rate of medication as needed for optimal therapy for the patient Send with patient on call to the OR.  Anesthesia to  complete antibiotic administration <26min prior to incision per [facility].   2 g 100 mL/hr over 30 Minutes Intravenous On call to O.R. 05/27/15 1320 05/27/15 1328      Assessment/Plan: s/p Procedure(s): EXPLORATORY LAPAROTOMY WITH SMALL BOWEL TO LEFT COLON BYPASS (N/A) GASTROSTOMY TUBE WITH 26 MALECOT (N/A) LOOKS WELL NO FLATUS YET OFF NEO   OK TO TRANSFER OUT PER MEDICAL SERVICE OOB TODAY    LOS: 9 days    Ashley Hurley A. 05/31/2015

## 2015-06-01 LAB — GLUCOSE, CAPILLARY
GLUCOSE-CAPILLARY: 162 mg/dL — AB (ref 65–99)
GLUCOSE-CAPILLARY: 174 mg/dL — AB (ref 65–99)
GLUCOSE-CAPILLARY: 196 mg/dL — AB (ref 65–99)
GLUCOSE-CAPILLARY: 200 mg/dL — AB (ref 65–99)
Glucose-Capillary: 183 mg/dL — ABNORMAL HIGH (ref 65–99)
Glucose-Capillary: 178 mg/dL — ABNORMAL HIGH (ref 65–99)

## 2015-06-01 LAB — COMPREHENSIVE METABOLIC PANEL
ALBUMIN: 1.3 g/dL — AB (ref 3.5–5.0)
ALK PHOS: 179 U/L — AB (ref 38–126)
ALT: 54 U/L (ref 14–54)
AST: 43 U/L — ABNORMAL HIGH (ref 15–41)
Anion gap: 4 — ABNORMAL LOW (ref 5–15)
BUN: 19 mg/dL (ref 6–20)
CALCIUM: 7.9 mg/dL — AB (ref 8.9–10.3)
CO2: 21 mmol/L — ABNORMAL LOW (ref 22–32)
CREATININE: 0.92 mg/dL (ref 0.44–1.00)
Chloride: 111 mmol/L (ref 101–111)
GFR calc non Af Amer: >60 mL/min (ref >60)
Glucose, Bld: 196 mg/dL — ABNORMAL HIGH (ref 65–99)
POTASSIUM: 4.3 mmol/L (ref 3.5–5.1)
Sodium: 136 mmol/L (ref 135–145)
TOTAL PROTEIN: 4.1 g/dL — AB (ref 6.5–8.1)
Total Bilirubin: 1.4 mg/dL — ABNORMAL HIGH (ref 0.3–1.2)

## 2015-06-01 LAB — PHOSPHORUS: Phosphorus: 3.6 mg/dL (ref 2.5–4.6)

## 2015-06-01 LAB — CBC
HCT: 22.8 % — ABNORMAL LOW (ref 36.0–46.0)
Hemoglobin: 7.9 g/dL — ABNORMAL LOW (ref 12.0–15.0)
MCH: 31.3 pg (ref 26.0–34.0)
MCHC: 34.6 g/dL (ref 30.0–36.0)
MCV: 90.5 fL (ref 78.0–100.0)
Platelets: 212 10*3/uL (ref 150–400)
RBC: 2.52 MIL/uL — AB (ref 3.87–5.11)
RDW: 15.7 % — ABNORMAL HIGH (ref 11.5–15.5)
WBC: 7.5 10*3/uL (ref 4.0–10.5)

## 2015-06-01 LAB — DIFFERENTIAL
BASOS ABS: 0 10*3/uL (ref 0.0–0.1)
Basophils Relative: 0 % (ref 0–1)
Eosinophils Absolute: 0 10*3/uL (ref 0.0–0.7)
Eosinophils Relative: 0 % (ref 0–5)
Lymphocytes Relative: 9 % — ABNORMAL LOW (ref 12–46)
Lymphs Abs: 0.7 10*3/uL (ref 0.7–4.0)
Monocytes Absolute: 0.8 10*3/uL (ref 0.1–1.0)
Monocytes Relative: 10 % (ref 3–12)
NEUTROS ABS: 6 10*3/uL (ref 1.7–7.7)
NEUTROS PCT: 81 % — AB (ref 43–77)

## 2015-06-01 LAB — TRIGLYCERIDES: Triglycerides: 316 mg/dL — ABNORMAL HIGH (ref <150)

## 2015-06-01 LAB — MAGNESIUM: Magnesium: 1.9 mg/dL (ref 1.7–2.4)

## 2015-06-01 LAB — PREALBUMIN: PREALBUMIN: 6.2 mg/dL — AB (ref 18–38)

## 2015-06-01 MED ORDER — HYDROCORTISONE NA SUCCINATE PF 100 MG IJ SOLR
25.0000 mg | Freq: Three times a day (TID) | INTRAMUSCULAR | Status: DC
  Administered 2015-06-02 (×2): 25 mg via INTRAVENOUS
  Filled 2015-06-01 (×2): qty 2

## 2015-06-01 MED ORDER — MAGNESIUM SULFATE 2 GM/50ML IV SOLN
2.0000 g | Freq: Once | INTRAVENOUS | Status: AC
  Administered 2015-06-01: 2 g via INTRAVENOUS
  Filled 2015-06-01: qty 50

## 2015-06-01 MED ORDER — FAT EMULSION 20 % IV EMUL
240.0000 mL | INTRAVENOUS | Status: DC
  Administered 2015-06-01: 240 mL via INTRAVENOUS
  Filled 2015-06-01: qty 250

## 2015-06-01 MED ORDER — PROMETHAZINE HCL 25 MG/ML IJ SOLN: 12.5000 mg | Freq: Four times a day (QID) | INTRAMUSCULAR | Status: DC | PRN

## 2015-06-01 MED ORDER — HYDROCORTISONE 1 % EX CREA: TOPICAL_CREAM | Freq: Three times a day (TID) | CUTANEOUS | Status: DC

## 2015-06-01 MED ORDER — BISACODYL 10 MG RE SUPP
10.0000 mg | Freq: Once | RECTAL | Status: AC
  Administered 2015-06-01: 10 mg via RECTAL
  Filled 2015-06-01: qty 1

## 2015-06-01 MED ORDER — MORPHINE SULFATE 2 MG/ML IJ SOLN
1.0000 mg | INTRAMUSCULAR | Status: DC | PRN
Start: 2015-06-01 — End: 2015-06-03

## 2015-06-01 MED ORDER — TRACE MINERALS CR-CU-MN-SE-ZN 10-1000-500-60 MCG/ML IV SOLN
INTRAVENOUS | Status: DC
  Administered 2015-06-01: 18:00:00 via INTRAVENOUS
  Filled 2015-06-01: qty 1992

## 2015-06-01 MED ORDER — FUROSEMIDE 10 MG/ML IJ SOLN
20.0000 mg | Freq: Every day | INTRAMUSCULAR | Status: DC
  Administered 2015-06-02 – 2015-06-05 (×4): 20 mg via INTRAVENOUS
  Filled 2015-06-01 (×4): qty 2

## 2015-06-01 NOTE — Progress Notes (Addendum)
Patient ID: Ashley Hurley, female   DOB: 03-15-1951, 64 y.o.   MRN: [id number]  [facility] PROGRESS NOTE  Ashley Hurley WUJ:[id number] DOB: 1951-04-03 DOA: 05/22/2015 PCP: Lottie Dawson, MD   Brief narrative:    64 y.o. F with hx breast CA s/p mastectomy, admitted 6/17 with N/V and abd pain. Found to have pancreatic mass, liver mass, LAD, SBO. Failed conservative management for SBO therefor taken to OR 6/23 for ex lap. Following surgery, she remained hypotensive; therefore, PCCM was consulted.   STUDIES:  CT A/P 6/14 >>> mass centered in pancreatic body suspicious for primary pancreatic carcinoma. Mass in left hepatic lobe suspicious for liver mets. Right hydroureteronephrosis and distal SBO due to irregular soft tissue mass in right adnexa.  SIGNIFICANT EVENTS: 6/17 - admit. 6/20 - liver biopsy (pathology positive for adenocarcinoma - pancreatobiliary). 6/23 - ex lap with SB to left colon bypass (palliative), gastrostomy tube placement. 6/26 - transferred from ICU to medical floor  6/27 - Pt transferred from [facility] to [facility] care; on TPN  Assessment/Plan:    Active Problems: SBO - s/p ex lap 6/23 with SB to left colon bypass (palliative), gastrostomy tube placement (Dr. Marlou Starks). - Pancreatic mass - unresectable per Dr. Marlou Starks. - still with ileus, ambulation encouraged, allowing clear liquids to see if pt able to tolerate  - post op care per [facility] - continue TPN for now  Newly discovered pancreatobiliary adenocarcinoma  - biopsy proven 6/20 (liver bx via IR) - biopsy results positive for adenocarcinoma (pancreatobiliary) with liver mets - awaiting call from oncology center, pt anxious to know who she will follow up with   Post op hypotension secondary to hypovolemia and adrenal insufficiency  - RUE PICC 6/22 >>> - Cont stress dose steroids but reduce as off pressors  Anemia of chronic disease, malignancy - no signs of active bleeding, CBC in AM  Acute hypoxic  respiratory failure - post op atelectasis and effusion at the right lung base, now resolved   Right hydroureteronephrosis  - secondary to a soft tissue mass in the right lower pelvis - Urologist recommend conservative management of asymptomatic hydronephrosis at this time.  - since Cr trending down, no stenting needed at this time   Acute renal failure - Secondary to right hydroureteronephrosis and prerenal etiology from dehydration in the setting of SBO - IV fluids provided, creatinine continues trending down  - will stop IVF due to volume overload - Repeat BMP in the morning  Volume overload - at baseline ~ 130 lbs but now up to 160's lbs - give dose of Lasix 20 mg IV x 1 and monitor clinical response   Hypokalemia - supplemented and WNL   Severe PCM - in the context of acute illness - appreciate nutritionist team assistance   DVT prophylaxis - Lovenox SQ  Code Status: Full.  Family Communication:  plan of care discussed with the patient Disposition Plan: Home when stable.   IV access:  Peripheral IV  Procedures and diagnostic studies:    Dg Abd 1 View 05/23/2015   Persistent dilated small bowel loops, measuring up to 4.9 cm in diameter, concerning for persistent small bowel obstruction. Underlying nondistended loops small bowel are also seen.   US Abdomen Complete 05/18/2015   Possible sludge in minimal cholelithiasis is noted with gallbladder wall thickening, but no pericholecystic fluid. Acute cholecystitis cannot be excluded, and HIDA scan may be performed for further evaluation.  2.7 cm complex abnormality seen in left hepatic lobe which may represent mass  or neoplasm. Also noted is possible epigastric mass measuring 4.1 x 3.6 x 2.9 cm. CT scan of the abdomen and pelvis with intravenous contrast administration is recommended for further evaluation.  Minimal left hydronephrosis is noted with moderate right hydronephrosis. Further evaluation with CT scan is recommended.      Ct Abdomen Pelvis W Contrast 05/19/2015    6 cm hypovascular mass centered in the pancreatic body, suspicious for primary pancreatic carcinoma. This mass shows encasement of the splenic artery and vein, with splenic vein thrombosis. Consider EUS for tissue diagnosis.  Diffuse biliary ductal dilatation, likely secondary to mild porta hepatis lymphadenopathy/metastatic disease.  3.5 cm hypovascular mass in the lateral segment of the left hepatic lobe, suspicious for liver metastasis. Indeterminate left hepatic lobe lesion adjacent to the falciform ligament could represent focal fatty infiltration, although hepatic metastasis cannot be excluded.  Right hydroureteronephrosis and distal small bowel obstruction due to irregular soft tissue mass in the right adnexa approximately 4 x 5 cm. Adjacent mesenteric lymphadenopathy also seen.   US Renal 05/18/2015   Moderate right hydronephrosis. Mild caliectasis left lower renal pole. No bladder distention.  2. Kidneys are slightly echogenic suggesting a component of chronic medical renal disease.  3. Mild ascites. Reference is made to abdominal ultrasound report for discussion of abdominal masses  US Biopsy 05/25/2015   Status post ultrasound-guided biopsy of left liver lobe mass, with tissue specimen sent to pathology for complete histopathologic analysis.    Dg Chest Port 1 View 05/31/2015   Worsening right mid and lower lung opacities, worrisome for progression of infection.    Dg Chest Port 1 View 05/29/2015 Minimal right basilar atelectasis.     Dg Abd 2 Views 05/26/2015   Persistently dilated loops of small bowel consistent with partial small bowel obstruction. 2. NG tube coils in the antrum of the stomach.    Dg Abd 2 Views 05/24/2015   Mild decrease in gaseous dilatation of small bowel loops which may indicate improving small bowel obstruction.    Dg Abd Acute W/chest 05/22/2015   Dilated small bowel loops are noted consistent with distal small bowel  obstruction.     Dg Abd Portable 1v 05/27/2015   Dilated loops of small bowel are noted which are more prominent compared to prior exam, concerning for small bowel obstruction.    Dg Loyce Dys Tube Plc W/fl W/rad 05/22/2015  Fluoroscopically guided nasogastric tube placement with tip in the stomach.     Medical Consultants:  Surgery  Other Consultants:  PT  IAnti-Infectives:   None   Faye Ramsay, MD  [facility] Pager [phone]  If 7PM-7AM, please contact night-coverage [email or web address] Password [facility] 06/01/2015, 12:41 PM   LOS: 10 days   HPI/Subjective: No events overnight.   Objective: Filed Vitals:   05/31/15 1618 05/31/15 1700 05/31/15 2238 06/01/15 0456  BP:  93/56 112/72 120/73  Pulse:  83 87 89  Temp: 98.3 F (36.8 C)  97.5 F (36.4 C) 97.9 F (36.6 C)  TempSrc: Oral  Oral Oral  Resp:  18 18 18   Height:      Weight:    76.64 kg (168 lb 15.4 oz)  SpO2:  97% 95% 96%    Intake/Output Summary (Last 24 hours) at 06/01/15 1241 Last data filed at 06/01/15 0400  Gross per 24 hour  Intake 458.67 ml  Output    252 ml  Net 206.67 ml    Exam:   General:  Pt is alert, follows commands appropriately, not in  acute distress  Cardiovascular: Regular rate and rhythm, S1/S2, no murmurs, no rubs, no gallops  Respiratory: Clear to auscultation bilaterally, no wheezing, diminished breath sounds at bases   Abdomen: Soft, non tender, non distended, bowel sounds present, no guarding  Data Reviewed: Basic Metabolic Panel:  Recent Labs Lab 05/28/15 0500 05/29/15 0410 05/30/15 0441 05/31/15 0428 06/01/15 0628  NA 138 135 135 138 136  K 3.6 3.7 3.8 4.2 4.3  CL 108 109 108 112* 111  CO2 23 20* 20* 22 21*  GLUCOSE 158* 256* 187* 201* 196*  BUN 20 15 15 16 19   CREATININE 1.15* 1.11* 0.95 0.89 0.92  CALCIUM 8.3* 7.2* 7.3* 7.4* 7.9*  MG 1.7 1.9 1.7 2.2 1.9  PHOS 2.6  --  1.2* 2.9 3.6   Liver Function Tests:  Recent Labs Lab 05/28/15 0500 05/31/15 0428  06/01/15 0628  AST 19 57* 43*  ALT 18 46 54  ALKPHOS 74 111 179*  BILITOT 0.5 1.9* 1.4*  PROT 4.8* 4.0* 4.1*  ALBUMIN 2.0* 1.2* 1.3*   CBC:  Recent Labs Lab 05/28/15 0500 05/29/15 0410 05/29/15 0730 05/31/15 0428 06/01/15 0628  WBC 4.2 15.0* 13.4* 8.8 7.5  NEUTROABS  --   --  11.4* 7.4 6.0  HGB 9.2* 7.5* 8.4* 7.6* 7.9*  HCT 26.9* 22.0* 24.4* 22.2* 22.8*  MCV 92.8 92.8 91.0 91.7 90.5  PLT 214 315 260 190 212   Cardiac Enzymes:  Recent Labs Lab 05/29/15 0730  TROPONINI 0.04*   CBG:  Recent Labs Lab 05/31/15 1615 05/31/15 1953 05/31/15 2356 06/01/15 0406 06/01/15 0743  GLUCAP 170* 165* 189* 183* 196*    Recent Results (from the past 240 hour(s))  Blood culture (routine x 2)     Status: None   Collection Time: 05/22/15 12:42 PM  Result Value Ref Range Status   Specimen Description BLOOD ARM LEFT  Final   Culture NO GROWTH 5 DAYS  Final   Report Status 05/27/2015 FINAL  Final  Urine culture     Status: None   Collection Time: 05/22/15  1:21 PM  Result Value Ref Range Status   Specimen Description URINE, CLEAN CATCH  Final   Special Requests NONE  Final   Culture   Final    MULTIPLE SPECIES PRESENT, SUGGEST RECOLLECTION IF CLINICALLY INDICATED   Report Status 05/23/2015 FINAL  Final  Blood culture (routine x 2)     Status: None   Collection Time: 05/22/15  5:05 PM  Result Value Ref Range Status   Specimen Description BLOOD LEFT ANTECUBITAL  Final   Culture NO GROWTH 5 DAYS  Final   Report Status 05/27/2015 FINAL  Final  Surgical pcr screen     Status: Abnormal   Collection Time: 05/28/15  7:39 AM  Result Value Ref Range Status   MRSA, PCR NEGATIVE NEGATIVE Final   Staphylococcus aureus POSITIVE (A) NEGATIVE Final     Scheduled Meds: . antiseptic oral rinse  7 mL Mouth Rinse q12n4p  . bisacodyl  10 mg Rectal Once  . chlorhexidine  15 mL Mouth Rinse BID  . enoxaparin (LOVENOX) injection  40 mg Subcutaneous QHS  . hydrocortisone sodium succinate  25  mg Intravenous Q6H  . insulin aspart  0-9 Units Subcutaneous 6 times per day  . magnesium sulfate 1 - 4 g bolus IVPB  2 g Intravenous Once  . pantoprazole (PROTONIX) IV  40 mg Intravenous Q24H   Continuous Infusions: . 0.45 % NaCl with KCl 20 mEq / L  10 mL/hr at 05/31/15 1443  . Marland KitchenTPN (CLINIMIX-E) Adult 70 mL/hr at 05/31/15 1738   And  . fat emulsion 240 mL (05/31/15 1739)  . Marland KitchenTPN (CLINIMIX-E) Adult     And  . fat emulsion

## 2015-06-01 NOTE — Progress Notes (Signed)
[facility] NOTE  Pharmacy Consult for TPN Indication: SBO, expected prolonged ileus  Allergies  Allergen Reactions  . Aspirin     Patient Measurements: Height: _0  (167.6 cm) Weight: 168 lb 15.4 oz (76.64 kg) IBW/kg (Calculated) : 59.3  Vital Signs: Temp: 97.9 F (36.6 C) (06/27 0456) Temp Source: Oral (06/27 0456) BP: 120/73 mmHg (06/27 0456) Pulse Rate: 89 (06/27 0456) Intake/Output from previous day: 06/26 0701 - 06/27 0700 In: 1058.7 [I.V.:408.7; TPN:650] Out: 777 [Urine:477; Drains:300] Intake/Output from this shift:    Labs:  Recent Labs  05/31/15 0428 06/01/15 0628  WBC 8.8 7.5  HGB 7.6* 7.9*  HCT 22.2* 22.8*  PLT 190 212     Recent Labs  05/30/15 0441 05/31/15 0428 06/01/15 0628  NA 135 138 136  K 3.8 4.2 4.3  CL 108 112* 111  CO2 20* 22 21*  GLUCOSE 187* 201* 196*  BUN _1 CREATININE 0.95 0.89 0.92  CALCIUM 7.3* 7.4* 7.9*  MG 1.7 2.2 1.9  PHOS 1.2* 2.9 3.6  PROT  --  4.0* 4.1*  ALBUMIN  --  1.2* 1.3*  AST  --  57* 43*  ALT  --  46 54  ALKPHOS  --  111 179*  BILITOT  --  1.9* 1.4*  PREALBUMIN  --   --  6.2*  TRIG  --   --  316*   Estimated Creatinine Clearance: 63.7 mL/min (by C-G formula based on Cr of 0.92).    Recent Labs  05/31/15 2356 06/01/15 0406 06/01/15 0743  GLUCAP 189* 183* 196*   Insulin Requirements in the past 24 hours:  12 units sensitive SSI + 20 units regular insulin in TPN bag  Assessment: 75 yof with presenting 05/22/15 with worsening N/V and abdominal pain. Duration of symptoms 2 months and has lost 11-12 lb in last month. Nutrition notes patient with moderate depletion in body fat, depletions in muscle mass. Patient found to have SBO likely due to pelvic mass - anticipated prolonged ileus post-op and patient has been NPO x ~5 days inpatient. Pharmacy consulted to initiate TPN for SBO and anticipated prolonged ileus post-op.  Surgeries/Procedures: 6/23: ex-lap w/ SB to L colon  bypass - palliative gastrostomy tube  GI: SBO likely due to pelvic mass - anticipated prolonged ileus post-op. Prealbumin 9 on admit >> 6.2. Did not tolerate sips of clears on 6/21. Throwing up bile and having BMs on 6/22 (no BM/BS since). Failed non-operative management - NGT clamped on 6/21, replaced 6/22 with continued vomiting. 6/23: Ex-lap w/ SB to L colon bypass (palliative) - gastrostomy tube placement. 430m of output from drain in 24h (decreased). Gtube to gravity. PPI IV. Transferred out of ICU on 6/26. Sips of clears started today  Endo: No DM hx, CBGs controlled prior to start of TPN and OR. Tapering stress dose steroids to off in next 4-7 days (hydrocortisone 282mIV q6h). CBGs 165-196 in last 24h  Lytes: Patient showed signs of refeeding on 6/25 and TPN rate was decreased - now increasing rate again slowly. No electrolyte repletion yesterday. K 4.3 (goal >/=4 with ileus), Mag 1.9 (goal >/=2 with ileus), Phos improved 3.6, corCa 10.1  Renal: SCr 0.92, CrCl~60-65, UOP 0.3 ml/kg/h per documentation. 1/2NS+20K kvo'd  Cards: HTN, HLD. Hypotension/tachycardia post-OR. Off pressors - BP still soft-wnl, HR ok  Hepatobil: AST up to down to 43, ALT remains normal. Alk phos increased to 179. TBili decreased to 1.4. Albumin remains low at 1.3. TG 316  this AM - will monitor  Neuro: Anxiety. hydromorphone prn  Heme/Onc: Pancreatobiliary adenocarcinoma with liver mets confirmed on 6/20 biopsy. Hx breast cancer s/p mastectomy. Chronic anemia. Hgb 7.9 stable, plts increased to 212. No overt bleeding noted.  ID: Afebrile, wbc wnl, no antibiotics 6/17 BCx4>>ngf 6/17 UC>>ngf  Best Practices: enox40, MC, IV PPI TPN Access: PICC placed 05/27/15 TPN start date: 05/27/15>>  Current Nutrition:  Clinimix E 5/15 _0  ml/h + 20% IVFE _1  ml/h- provides 84g protein and 1672kcal in 24h period NPO - sips of clears  Nutritional Goals:  1750-1950 kCal, 90-100 grams of protein per day (per Nutrition  assessment)  Plan: -TPN has been advanced slowly due to patient showing refeeding signs previously. Will increase Clinimix E to goal rate 64m/hr + continue 20% IVFE at 10 ml/h - to provide 100g protein and 1843kcal (meeting 100% of protein and kcal goals) - Multivitamin and trace elements daily in TPN - Increase regular insulin in TPN bag to 25 units + continue sensitive SSI and monitor CBGs - Mg sulfate 2g x1 - IVF already kvo'd per MD - F/u AM labs  HElicia Lamp PharmD Clinical Pharmacist - Resident Pager [phone]/27/2016 9:11 AM

## 2015-06-01 NOTE — Care Management Note (Signed)
Case Management Note  Patient Details  Name: Stephonie Wilcoxen MRN: [id number] Date of Birth: Mar 17, 1951  Subjective/Objective:                    Action/Plan: UR updated   Expected Discharge Date:    06-04-15               Expected Discharge Plan:  [place]  In-House Referral:     Discharge planning Services     Post Acute Care Choice:    Choice offered to:     DME Arranged:    DME Agency:     HH Arranged:    HH Agency:     Status of Service:  In process, will continue to follow  Medicare Important Message Given:    Date Medicare IM Given:    Medicare IM give by:    Date Additional Medicare IM Given:    Additional Medicare Important Message give by:     If discussed at [place] of Stay Meetings, dates discussed:    Additional Comments:  Marilu Favre, RN 06/01/2015, 2:01 PM

## 2015-06-01 NOTE — Progress Notes (Signed)
Patient ID: Ashley Hurley, female   DOB: 10-Oct-1951, 64 y.o.   MRN: [id number]   LOS: 10 days   POD#4  Subjective: Denies N/V/flatus but feels like it's coming. Pain manageable.   Objective: Vital signs in last 24 hours: Temp:  [97.5 F (36.4 C)-98.3 F (36.8 C)] 97.9 F (36.6 C) (06/27 0456) Pulse Rate:  [78-96] 89 (06/27 0456) Resp:  [18-26] 18 (06/27 0456) BP: (81-120)/(48-75) 120/73 mmHg (06/27 0456) SpO2:  [92 %-97 %] 96 % (06/27 0456) Weight:  [76.34 kg (168 lb 4.8 oz)-76.64 kg (168 lb 15.4 oz)] 76.64 kg (168 lb 15.4 oz) (06/27 0456) Last BM Date: 05/27/15   Laboratory  CBC  Recent Labs  05/31/15 0428 06/01/15 0628  WBC 8.8 7.5  HGB 7.6* 7.9*  HCT 22.2* 22.8*  PLT 190 212   BMET  Recent Labs  05/31/15 0428 06/01/15 0628  NA 138 136  K 4.2 4.3  CL 112* 111  CO2 22 21*  GLUCOSE 201* 196*  BUN 16 19  CREATININE 0.89 0.92  CALCIUM 7.4* 7.9*    Physical Exam General appearance: alert and no distress Resp: clear to auscultation bilaterally Cardio: regular rate and rhythm GI: Soft, minimal BS, incision C/D/I   Assessment/Plan: SBO s/p ex lap, ileocolonic bypass -- Ileus continues. Encouraged ambulation. Will let her have sips of clears. Multiple medical problems -- per primary service    Lisette Abu, PA-C Pager: [phone] General Trauma PA Pager: [phone]  06/01/2015

## 2015-06-02 LAB — BASIC METABOLIC PANEL
Anion gap: 4 — ABNORMAL LOW (ref 5–15)
BUN: 27 mg/dL — ABNORMAL HIGH (ref 6–20)
CALCIUM: 7.5 mg/dL — AB (ref 8.9–10.3)
CHLORIDE: 108 mmol/L (ref 101–111)
CO2: 22 mmol/L (ref 22–32)
Creatinine, Ser: 0.98 mg/dL (ref 0.44–1.00)
GFR calc Af Amer: >60 mL/min (ref >60)
GFR calc non Af Amer: 59 mL/min — ABNORMAL LOW (ref >60)
GLUCOSE: 205 mg/dL — AB (ref 65–99)
Potassium: 4.2 mmol/L (ref 3.5–5.1)
SODIUM: 134 mmol/L — AB (ref 135–145)

## 2015-06-02 LAB — MAGNESIUM: Magnesium: 2 mg/dL (ref 1.7–2.4)

## 2015-06-02 LAB — GLUCOSE, CAPILLARY
GLUCOSE-CAPILLARY: 197 mg/dL — AB (ref 65–99)
GLUCOSE-CAPILLARY: 195 mg/dL — AB (ref 65–99)
GLUCOSE-CAPILLARY: 157 mg/dL — AB (ref 65–99)
Glucose-Capillary: 194 mg/dL — ABNORMAL HIGH (ref 65–99)
Glucose-Capillary: 164 mg/dL — ABNORMAL HIGH (ref 65–99)

## 2015-06-02 LAB — PHOSPHORUS: Phosphorus: 3.8 mg/dL (ref 2.5–4.6)

## 2015-06-02 MED ORDER — ESCITALOPRAM OXALATE 20 MG PO TABS
20.0000 mg | ORAL_TABLET | Freq: Every day | ORAL | Status: DC
  Administered 2015-06-02 – 2015-06-08 (×7): 20 mg via ORAL
  Filled 2015-06-02 (×7): qty 1

## 2015-06-02 MED ORDER — PANTOPRAZOLE SODIUM 40 MG PO TBEC
40.0000 mg | DELAYED_RELEASE_TABLET | Freq: Every day | ORAL | Status: DC
Start: 2015-06-02 — End: 2015-06-08
  Administered 2015-06-02 – 2015-06-08 (×7): 40 mg via ORAL
  Filled 2015-06-02 (×7): qty 1

## 2015-06-02 MED ORDER — HYDROCORTISONE NA SUCCINATE PF 100 MG IJ SOLR
25.0000 mg | Freq: Two times a day (BID) | INTRAMUSCULAR | Status: DC
  Administered 2015-06-03 (×2): 25 mg via INTRAVENOUS
  Filled 2015-06-02 (×2): qty 2

## 2015-06-02 MED ORDER — FAT EMULSION 20 % IV EMUL
240.0000 mL | INTRAVENOUS | Status: AC
  Administered 2015-06-02: 240 mL via INTRAVENOUS
  Filled 2015-06-02: qty 250

## 2015-06-02 MED ORDER — TRACE MINERALS CR-CU-MN-SE-ZN 10-1000-500-60 MCG/ML IV SOLN
INTRAVENOUS | Status: AC
  Administered 2015-06-02: 17:00:00 via INTRAVENOUS
  Filled 2015-06-02: qty 1992

## 2015-06-02 NOTE — Progress Notes (Signed)
Patient ID: Ashley Hurley, female   DOB: 1951/01/05, 64 y.o.   MRN: [id number]  [facility] PROGRESS NOTE  Ivet Guerrieri WUJ:[id number] DOB: [birth year] DOA: 05/22/2015 PCP: Lottie Dawson, MD   Brief narrative:    64 y.o. F with hx breast CA s/p mastectomy, admitted 6/17 with N/V and abd pain. Found to have pancreatic mass, liver mass, LAD, SBO. Failed conservative management for SBO therefor taken to OR 6/23 for ex lap. Following surgery, she remained hypotensive; therefore, PCCM was consulted.   STUDIES:  CT A/P 6/14 >>> mass centered in pancreatic body suspicious for primary pancreatic carcinoma. Mass in left hepatic lobe suspicious for liver mets. Right hydroureteronephrosis and distal SBO due to irregular soft tissue mass in right adnexa.  SIGNIFICANT EVENTS: 6/17 - admit. 6/20 - liver biopsy (pathology positive for adenocarcinoma - pancreatobiliary). 6/23 - ex lap with SB to left colon bypass (palliative), gastrostomy tube placement. 6/26 - transferred from ICU to medical floor  6/27 - Pt transferred from [facility] to [facility] care; on TPN 6/28 - Tolerating clear liquids, has had BM  Assessment/Plan:    Active Problems: SBO - s/p ex lap 6/23, with SB to left colon bypass (palliative), gastrostomy tube placement (Dr. Marlou Starks), post op day #5 - Pancreatic mass - unresectable per Dr. Marlou Starks. - ileus improving, pt has had two small BM's and flatus  - encouraged ambulation and IS, pt in good spirits  - plan to advance diet to full liquids tonight and see how she does - continue G-tube to gravity, staples to be discontinued POD #10 Severe PCM - in the setting of the acute on chronic illness - slowly advancing diet as noted above  -Dietitian following  Newly discovered pancreatobiliary adenocarcinoma  - biopsy proven 6/20 (liver bx via IR) - biopsy results positive for adenocarcinoma (pancreatobiliary) with liver mets - Spoke with Dr. Benay Spice, certainly appreciate his  assistance  - he will try to see pt on Thursday as pt will likely still be hospitalizes until Thursday   Post op hypotension secondary to hypovolemia and adrenal insufficiency  - RUE PICC 6/22 >>> - continue to taper down stress dose steroids from TID to BID today and possibly QD tomorrow 6/29, and than d/c  Anemia of chronic disease, malignancy - no signs of active bleeding, CBC in AM  Acute hypoxic respiratory failure - post op atelectasis and effusion at the right lung base, now resolved   Right hydroureteronephrosis  - secondary to a soft tissue mass in the right lower pelvis - Urologist recommend conservative management of asymptomatic hydronephrosis at this time.  - since Cr trending down, no stenting needed at this time   Acute renal failure - Secondary to right hydroureteronephrosis and prerenal etiology from dehydration in the setting of SBO - IV fluids provided, creatinine remains WNL  - IVF have been discontinued 6/27 due to volume overload  - Repeat BMP in the morning  Volume overload in the setting of severe hypoalbuminemia and post op hypotension requiring IVF - at baseline ~ 130 lbs but now up to 170's lbs - will give additional dose of Lasix 20 mg IV   Hypokalemia - supplemented and WNL   DVT prophylaxis - Lovenox SQ  Code Status: Full.  Family Communication:  plan of care discussed with the patient Disposition Plan: Home possibly on Thursday per surgery team   IV access:  Peripheral IV  Procedures and diagnostic studies:    Dg Abd 1 View 05/23/2015   Persistent dilated small  bowel loops, measuring up to 4.9 cm in diameter, concerning for persistent small bowel obstruction. Underlying nondistended loops small bowel are also seen.   US Abdomen Complete 05/18/2015   Possible sludge in minimal cholelithiasis is noted with gallbladder wall thickening, but no pericholecystic fluid. Acute cholecystitis cannot be excluded, and HIDA scan may be performed for  further evaluation.  2.7 cm complex abnormality seen in left hepatic lobe which may represent mass or neoplasm. Also noted is possible epigastric mass measuring 4.1 x 3.6 x 2.9 cm. CT scan of the abdomen and pelvis with intravenous contrast administration is recommended for further evaluation.  Minimal left hydronephrosis is noted with moderate right hydronephrosis. Further evaluation with CT scan is recommended.     Ct Abdomen Pelvis W Contrast 05/19/2015    6 cm hypovascular mass centered in the pancreatic body, suspicious for primary pancreatic carcinoma. This mass shows encasement of the splenic artery and vein, with splenic vein thrombosis. Consider EUS for tissue diagnosis.  Diffuse biliary ductal dilatation, likely secondary to mild porta hepatis lymphadenopathy/metastatic disease.  3.5 cm hypovascular mass in the lateral segment of the left hepatic lobe, suspicious for liver metastasis. Indeterminate left hepatic lobe lesion adjacent to the falciform ligament could represent focal fatty infiltration, although hepatic metastasis cannot be excluded.  Right hydroureteronephrosis and distal small bowel obstruction due to irregular soft tissue mass in the right adnexa approximately 4 x 5 cm. Adjacent mesenteric lymphadenopathy also seen.   US Renal 05/18/2015   Moderate right hydronephrosis. Mild caliectasis left lower renal pole. No bladder distention.  2. Kidneys are slightly echogenic suggesting a component of chronic medical renal disease.  3. Mild ascites. Reference is made to abdominal ultrasound report for discussion of abdominal masses  US Biopsy 05/25/2015   Status post ultrasound-guided biopsy of left liver lobe mass, with tissue specimen sent to pathology for complete histopathologic analysis.    Dg Chest Port 1 View 05/31/2015   Worsening right mid and lower lung opacities, worrisome for progression of infection.    Dg Chest Port 1 View 05/29/2015 Minimal right basilar atelectasis.     Dg  Abd 2 Views 05/26/2015   Persistently dilated loops of small bowel consistent with partial small bowel obstruction. 2. NG tube coils in the antrum of the stomach.    Dg Abd 2 Views 05/24/2015   Mild decrease in gaseous dilatation of small bowel loops which may indicate improving small bowel obstruction.    Dg Abd Acute W/chest 05/22/2015   Dilated small bowel loops are noted consistent with distal small bowel obstruction.     Dg Abd Portable 1v 05/27/2015   Dilated loops of small bowel are noted which are more prominent compared to prior exam, concerning for small bowel obstruction.    Dg Loyce Dys Tube Plc W/fl W/rad 05/22/2015  Fluoroscopically guided nasogastric tube placement with tip in the stomach.     Medical Consultants:  Surgery Oncology   Other Consultants:  PT  IAnti-Infectives:   None   Faye Ramsay, MD  [facility] Pager [phone]  If 7PM-7AM, please contact night-coverage [email or web address] Password [facility] 06/02/2015, 6:36 PM   LOS: 11 days   HPI/Subjective: No events overnight.   Objective: Filed Vitals:   06/01/15 1300 06/01/15 2127 06/02/15 0553 06/02/15 1300  BP: 110/71 108/66 114/69 117/71  Pulse: 95 97 90 98  Temp: 98.4 F (36.9 C) 98 F (36.7 C) 98 F (36.7 C) 98.2 F (36.8 C)  TempSrc: Oral Oral Oral Oral  Resp: 18  16 16 16   Height:      Weight:   77.3 kg (170 lb 6.7 oz)   SpO2: 96% 95% 96% 100%    Intake/Output Summary (Last 24 hours) at 06/02/15 1836 Last data filed at 06/02/15 1741  Gross per 24 hour  Intake   1914 ml  Output   1101 ml  Net    813 ml    Exam:   General:  Pt is alert, follows commands appropriately, not in acute distress  Cardiovascular: Regular rate and rhythm, S1/S2, no murmurs, no rubs, no gallops  Respiratory: Clear to auscultation bilaterally, no wheezing, diminished breath sounds at bases   Abdomen: Soft, non tender, non distended, bowel sounds present, no guarding  Data Reviewed: Basic Metabolic Panel:  Recent  Labs Lab 05/28/15 0500 05/29/15 0410 05/30/15 0441 05/31/15 0428 06/01/15 0628 06/02/15 0640  NA 138 135 135 138 136 134*  K 3.6 3.7 3.8 4.2 4.3 4.2  CL 108 109 108 112* 111 108  CO2 23 20* 20* 22 21* 22  GLUCOSE 158* 256* 187* 201* 196* 205*  BUN 20 15 15 16 19  27*  CREATININE 1.15* 1.11* 0.95 0.89 0.92 0.98  CALCIUM 8.3* 7.2* 7.3* 7.4* 7.9* 7.5*  MG 1.7 1.9 1.7 2.2 1.9 2.0  PHOS 2.6  --  1.2* 2.9 3.6 3.8   Liver Function Tests:  Recent Labs Lab 05/28/15 0500 05/31/15 0428 06/01/15 0628  AST 19 57* 43*  ALT 18 46 54  ALKPHOS 74 111 179*  BILITOT 0.5 1.9* 1.4*  PROT 4.8* 4.0* 4.1*  ALBUMIN 2.0* 1.2* 1.3*   CBC:  Recent Labs Lab 05/28/15 0500 05/29/15 0410 05/29/15 0730 05/31/15 0428 06/01/15 0628  WBC 4.2 15.0* 13.4* 8.8 7.5  NEUTROABS  --   --  11.4* 7.4 6.0  HGB 9.2* 7.5* 8.4* 7.6* 7.9*  HCT 26.9* 22.0* 24.4* 22.2* 22.8*  MCV 92.8 92.8 91.0 91.7 90.5  PLT 214 315 260 190 212   Cardiac Enzymes:  Recent Labs Lab 05/29/15 0730  TROPONINI 0.04*   CBG:  Recent Labs Lab 06/01/15 2341 06/02/15 0349 06/02/15 0738 06/02/15 1203 06/02/15 1607  GLUCAP 200* 197* 194* 195* 164*    Recent Results (from the past 240 hour(s))  Blood culture (routine x 2)     Status: None   Collection Time: 05/22/15 12:42 PM  Result Value Ref Range Status   Specimen Description BLOOD ARM LEFT  Final   Culture NO GROWTH 5 DAYS  Final   Report Status 05/27/2015 FINAL  Final  Urine culture     Status: None   Collection Time: 05/22/15  1:21 PM  Result Value Ref Range Status   Specimen Description URINE, CLEAN CATCH  Final   Special Requests NONE  Final   Culture   Final    MULTIPLE SPECIES PRESENT, SUGGEST RECOLLECTION IF CLINICALLY INDICATED   Report Status 05/23/2015 FINAL  Final  Blood culture (routine x 2)     Status: None   Collection Time: 05/22/15  5:05 PM  Result Value Ref Range Status   Specimen Description BLOOD LEFT ANTECUBITAL  Final   Culture NO GROWTH  5 DAYS  Final   Report Status 05/27/2015 FINAL  Final  Surgical pcr screen     Status: Abnormal   Collection Time: 05/28/15  7:39 AM  Result Value Ref Range Status   MRSA, PCR NEGATIVE NEGATIVE Final   Staphylococcus aureus POSITIVE (A) NEGATIVE Final     Scheduled Meds: . antiseptic oral  rinse  7 mL Mouth Rinse q12n4p  . chlorhexidine  15 mL Mouth Rinse BID  . enoxaparin (LOVENOX) injection  40 mg Subcutaneous QHS  . escitalopram  20 mg Oral Daily  . furosemide  20 mg Intravenous Daily  . hydrocortisone sodium succinate  25 mg Intravenous Q8H  . insulin aspart  0-9 Units Subcutaneous 6 times per day  . pantoprazole  40 mg Oral Daily   Continuous Infusions: . Marland KitchenTPN (CLINIMIX-E) Adult 83 mL/hr at 06/02/15 1727   And  . fat emulsion 240 mL (06/02/15 1730)

## 2015-06-02 NOTE — Progress Notes (Signed)
Nutrition Follow-up  DOCUMENTATION CODES:  Severe malnutrition in context of chronic illness  INTERVENTION:  TPN  NUTRITION DIAGNOSIS:  Malnutrition related to acute illness as evidenced by moderate depletion of body fat, moderate depletions of muscle mass.  Ongoing  GOAL:  Patient will meet greater than or equal to 90% of their needs  Met with TPN  MONITOR:  Diet advancement, Skin, Weight trends, I & O's, Labs  REASON FOR ASSESSMENT:  Consult New TPN/TNA  ASSESSMENT: 64 year old female with HTN, history of breast cancer, presented to [facility] ED with main concern of several days duration of progressively worsening abd pain associated with nausea and non bloody vomiting. Current work up notable for SBO, pancreatic and liver mass and was admitted for further management.  S/p Procedure(s) 05/28/15: EXPLORATORY LAPAROTOMY WITH SMALL BOWEL TO LEFT COLON BYPASS - Palliative GASTROSTOMY TUBE WITH 26 MALEncrodT (N/A)  NGT removed on 05/28/15.   Pt remains NPO, but receiving water and ice chips from the floor. G-tube open to gravity; per MD notes, drainage is decreasing. Noted ~1300 ml output over the past 24 hours per doc flowsheets.   Pt remains on TPN. Per pharmacy notes, Clinimix E at goal rate 35m/hr + 20% IVFE at 10 ml/h which provides 100g protein and 1843kcal (meeting 100% of protein and kcal goals).   [facility] on tumor board [facility]   Labs reviewed: Na: 134.   Height:  Ht Readings from Last 1 Encounters:  05/22/15 '5\' 6"'  (1.676 m)    Weight:  Wt Readings from Last 1 Encounters:  06/02/15 170 lb 6.7 oz (77.3 kg)    Ideal Body Weight:  59 kg  Wt Readings from Last 10 Encounters:  06/02/15 170 lb 6.7 oz (77.3 kg)  05/21/15 132 lb (59.875 kg)  05/19/15 131 lb 12.8 oz (59.784 kg)  05/14/15 132 lb 3.2 oz (59.966 kg)  05/01/15 139 lb (63.05 kg)  09/09/14 147 lb (66.679 kg)  08/14/13 147 lb (66.679  kg)  07/31/12 145 lb (65.772 kg)  07/17/12 145 lb 1.6 oz (65.817 kg)  05/23/12 145 lb (65.772 kg)    BMI:  Body mass index is 27.52 kg/(m^2).  Estimated Nutritional Needs:  Kcal:  [address]1 Protein:  90-100 grams  Fluid:  1.7-1.9 L  Skin:  Wound (see comment) (scab on rt breast, closed abdominal incision)  Diet Order:  TPN (CLINIMIX-E) Adult TPN (CLINIMIX-E) Adult Diet clear liquid Room service appropriate?: Yes; Fluid consistency:: Thin Diet full liquid Room service appropriate?: Yes; Fluid consistency:: Thin  EDUCATION NEEDS:  Education needs addressed   Intake/Output Summary (Last 24 hours) at 06/02/15 1518 Last data filed at 06/02/15 0600  Gross per 24 hour  Intake   1434 ml  Output    702 ml  Net    732 ml    Last BM:  06/01/15  Ashley Hurley RD, LDN, CDE Pager: [phone] hours Pager: [phone]

## 2015-06-02 NOTE — Progress Notes (Signed)
[place] NOTE  Pharmacy Consult for TPN Indication: SBO, expected prolonged ileus  Allergies  Allergen Reactions  . Aspirin     Patient Measurements: Height: '5\' 6"'  (167.6 cm) Weight: 170 lb 6.7 oz (77.3 kg) IBW/kg (Calculated) : 59.3  Vital Signs: Temp: 98 F (36.7 C) (06/28 0553) Temp Source: Oral (06/28 0553) BP: 114/69 mmHg (06/28 0553) Pulse Rate: 90 (06/28 0553) Intake/Output from previous day: 06/27 0701 - 06/28 0700 In: 1434 [YIA:1655] Out: 3748 [Urine:152; Drains:1300; Stool:1] Intake/Output from this shift:    Labs:  Recent Labs  05/31/15 0428 06/01/15 0628  WBC 8.8 7.5  HGB 7.6* 7.9*  HCT 22.2* 22.8*  PLT 190 212     Recent Labs  05/31/15 0428 06/01/15 0628 06/02/15 0640  NA 138 136 134*  K 4.2 4.3 4.2  CL 112* 111 108  CO2 22 21* 22  GLUCOSE 201* 196* 205*  BUN 16 19 27*  CREATININE 0.89 0.92 0.98  CALCIUM 7.4* 7.9* 7.5*  MG 2.2 1.9 2.0  PHOS 2.9 3.6 3.8  PROT 4.0* 4.1*  --   ALBUMIN 1.2* 1.3*  --   AST 57* 43*  --   ALT 46 54  --   ALKPHOS 111 179*  --   BILITOT 1.9* 1.4*  --   PREALBUMIN  --  6.2*  --   TRIG  --  316*  --    Estimated Creatinine Clearance: 60.1 mL/min (by C-G formula based on Cr of 0.98).    Recent Labs  06/01/15 2341 06/02/15 0349 06/02/15 0738  GLUCAP 200* 197* 194*   Insulin Requirements in the past 24 hours:  12 units sensitive SSI + 25 units regular insulin in TPN bag  Assessment: 54 yof with presenting 05/22/15 with worsening N/V and abdominal pain. Duration of symptoms 2 months and has lost 11-12 lb in last month. Nutrition notes patient with moderate depletion in body fat, depletions in muscle mass. Patient found to have SBO likely due to pelvic mass - anticipated prolonged ileus post-op and patient has been NPO x ~5 days inpatient. Pharmacy consulted to initiate TPN for SBO and anticipated prolonged ileus post-op. Expected d/c date 6/30 per case management  note  Surgeries/Procedures: 6/23: ex-lap w/ SB to L colon bypass - palliative gastrostomy tube  GI: SBO likely due to pelvic mass - anticipated prolonged ileus post-op. Prealbumin 9 on admit >> 6.2. Did not tolerate sips of clears on 6/21. Throwing up bile and having BMs on 6/22 (no BM/BS since). Failed non-operative management - NGT clamped on 6/21, replaced 6/22 with continued vomiting. 6/23: Ex-lap w/ SB to L colon bypass (palliative) - gastrostomy tube placement. 674m of output from drain in 24h (increased). Gtube to gravity. PPI IV. Transferred out of ICU on 6/26. Ileus resolving. Sips of clears started 6/27  Endo: No DM hx, CBGs controlled prior to start of TPN and OR. Tapering stress dose steroids to off in next 4-7 days (decreased hydrocortisone to 252mIV q8h). CBGs increased with increased TPN rate 174-205 in last 24h  Lytes: Patient showed signs of refeeding on 6/25 and TPN rate was decreased - now increased to goal rate again yesterday. Na slightly low 134, K 4.2 with no replacement yesterday (goal >/=4 with ileus), Mag 2 s/p Mg sulfate 2g yesterday (goal >/=2 with ileus), Phos increased 3.8, corCa 9.7  Renal: SCr 0.98, CrCl~60-65, I/Os appear innaccurate. IVF d/c'd.  lasix 20 IV x 1 dose today ordered  Cards: HTN, HLD. Hypotension/tachycardia post-OR.  Off pressors - BP wnl, HR ok  Hepatobil: AST down to 43, ALT remains normal. Alk phos increased to 179. TBili decreased to 1.4. Albumin remains low at 1.3. TG 316 this AM - will monitor  Neuro: Anxiety. hydromorphone prn  Heme/Onc: Pancreatobiliary adenocarcinoma with liver mets confirmed on 6/20 biopsy. Hx breast cancer s/p mastectomy. Chronic anemia. Hgb 7.9 stable, plts increased to 212. No overt bleeding noted.  ID: Afebrile, wbc wnl, no antibiotics 6/17 BCx4>>ngf 6/17 UC>>ngf  Best Practices: enox40, MC, IV PPI TPN Access: PICC placed 05/27/15 TPN start date: 05/27/15>>  Current Nutrition:  Clinimix E 5/15 '@83'  ml/h + 20%  IVFE '@10'  ml/h- provides 100g protein and 1843kcal in 24h period NPO - sips of clears  Nutritional Goals:  1750-1950 kCal, 90-100 grams of protein per day (per Nutrition assessment)  Plan: -Continue Clinimix E at goal rate 9m/hr + continue 20% IVFE at 10 ml/h - to provide 100g protein and 1843kcal (meeting 100% of protein and kcal goals) - Multivitamin and trace elements daily in TPN - Increase regular insulin in TPN bag to 30 units + continue sensitive SSI and monitor CBGs - F/u AM labs  HElicia Lamp PharmD Clinical Pharmacist - Resident Pager [phone]/28/2016 8:16 AM

## 2015-06-02 NOTE — Progress Notes (Signed)
[facility] Progress Note  5 Days Post-Op  Subjective: Pt doing great.  Having minimal pain.  No N/V, tolerating sips of clears well.  Ambulating well.  Friend at bedside.  Had a BM, and flatus.  G-tube to gravity.    Objective: Vital signs in last 24 hours: Temp:  [98 F (36.7 C)-98.4 F (36.9 C)] 98 F (36.7 C) (06/28 0553) Pulse Rate:  [90-97] 90 (06/28 0553) Resp:  [16-18] 16 (06/28 0553) BP: (108-114)/(66-71) 114/69 mmHg (06/28 0553) SpO2:  [95 %-96 %] 96 % (06/28 0553) Weight:  [77.3 kg (170 lb 6.7 oz)] 77.3 kg (170 lb 6.7 oz) (06/28 0553) Last BM Date: 06/01/15  Intake/Output from previous day: 06/27 0701 - 06/28 0700 In: 1434 [TPN:1434] Out: 1453 [Urine:152; Drains:1300; Stool:1] Intake/Output this shift:    PE: Gen:  Alert, NAD, pleasant Card:  RRR, no M/G/R heard Pulm:  CTA, no W/R/R, IS up to 1250 Abd: Soft, mild tenderness, ND, +BS, no HSM, incisions C/D/I with staples in place, G-tube in place in Left abdomen to gravity   Lab Results:   Recent Labs  05/31/15 0428 06/01/15 0628  WBC 8.8 7.5  HGB 7.6* 7.9*  HCT 22.2* 22.8*  PLT 190 212   BMET  Recent Labs  06/01/15 0628 06/02/15 0640  NA 136 134*  K 4.3 4.2  CL 111 108  CO2 21* 22  GLUCOSE 196* 205*  BUN 19 27*  CREATININE 0.92 0.98  CALCIUM 7.9* 7.5*   PT/INR No results for input(s): LABPROT, INR in the last 72 hours. CMP     Component Value Date/Time   NA 134* 06/02/2015 0640   K 4.2 06/02/2015 0640   CL 108 06/02/2015 0640   CO2 22 06/02/2015 0640   GLUCOSE 205* 06/02/2015 0640   BUN 27* 06/02/2015 0640   CREATININE 0.98 06/02/2015 0640   CALCIUM 7.5* 06/02/2015 0640   PROT 4.1* 06/01/2015 0628   ALBUMIN 1.3* 06/01/2015 0628   AST 43* 06/01/2015 0628   ALT 54 06/01/2015 0628   ALKPHOS 179* 06/01/2015 0628   BILITOT 1.4* 06/01/2015 0628   GFRNONAA 59* 06/02/2015 0640   GFRAA >60 06/02/2015 0640   Lipase     Component Value Date/Time   LIPASE 57* 05/22/2015  1153       Studies/Results: No results found.  Anti-infectives: Anti-infectives    Start     Dose/Rate Route Frequency Ordered Stop   05/28/15 0930  cefOXitin (MEFOXIN) 2 g in dextrose 5 % 50 mL IVPB    Comments:  Pharmacy may adjust dosing strength, interval, or rate of medication as needed for optimal therapy for the patient Send with patient on call to the OR.  Anesthesia to complete antibiotic administration <38min prior to incision per [facility].   2 g 100 mL/hr over 30 Minutes Intravenous On call to O.R. 05/28/15 0919 05/28/15 0930   05/28/15 0600  cefOXitin (MEFOXIN) 2 g in dextrose 5 % 50 mL IVPB  Status:  Discontinued    Comments:  Pharmacy may adjust dosing strength, interval, or rate of medication as needed for optimal therapy for the patient Send with patient on call to the OR.  Anesthesia to complete antibiotic administration <51min prior to incision per [facility].   2 g 100 mL/hr over 30 Minutes Intravenous On call to O.R. 05/27/15 1320 05/27/15 1328       Assessment/Plan Pancreatic mass - adenocarcinoma -Unresectable SBO secondary to above POD #5 s/p ex lap, ileocolonic bypass  -  Ileus improved.  Did have a BM, having flatus.   -Encouraged ambulation and IS.  -Advance to clear liquid tray, then fulls at dinner if tolerating -Pain control, antiemetics -Continue G-tube to gravity -Staples to be discontinued POD #10 PCM  -Dietitian following, advance diet as able. Multiple medical problems -- per primary service  Disp:   -Maybe Thursday if tolerating diet well.     LOS: 11 days    Nat Christen 06/02/2015, 10:30 AM Pager: [phone]

## 2015-06-03 ENCOUNTER — Telehealth: Payer: Self-pay | Admitting: Oncology

## 2015-06-03 DIAGNOSIS — C259 Malignant neoplasm of pancreas, unspecified: Secondary | ICD-10-CM

## 2015-06-03 DIAGNOSIS — E876 Hypokalemia: Secondary | ICD-10-CM

## 2015-06-03 LAB — COMPREHENSIVE METABOLIC PANEL
ALBUMIN: 1.2 g/dL — AB (ref 3.5–5.0)
ALT: 44 U/L (ref 14–54)
ANION GAP: 9 (ref 5–15)
AST: 27 U/L (ref 15–41)
Alkaline Phosphatase: 210 U/L — ABNORMAL HIGH (ref 38–126)
BUN: 31 mg/dL — ABNORMAL HIGH (ref 6–20)
CALCIUM: 7.4 mg/dL — AB (ref 8.9–10.3)
CO2: 24 mmol/L (ref 22–32)
CREATININE: 1.11 mg/dL — AB (ref 0.44–1.00)
Chloride: 104 mmol/L (ref 101–111)
GFR calc Af Amer: 59 mL/min — ABNORMAL LOW (ref >60)
GFR calc non Af Amer: 51 mL/min — ABNORMAL LOW (ref >60)
Glucose, Bld: 163 mg/dL — ABNORMAL HIGH (ref 65–99)
POTASSIUM: 3.4 mmol/L — AB (ref 3.5–5.1)
Sodium: 137 mmol/L (ref 135–145)
Total Bilirubin: 2.3 mg/dL — ABNORMAL HIGH (ref 0.3–1.2)
Total Protein: 4 g/dL — ABNORMAL LOW (ref 6.5–8.1)

## 2015-06-03 LAB — CBC
HCT: 21.1 % — ABNORMAL LOW (ref 36.0–46.0)
HEMOGLOBIN: 7.4 g/dL — AB (ref 12.0–15.0)
MCH: 31.4 pg (ref 26.0–34.0)
MCHC: 35.1 g/dL (ref 30.0–36.0)
MCV: 89.4 fL (ref 78.0–100.0)
Platelets: 247 10*3/uL (ref 150–400)
RBC: 2.36 MIL/uL — AB (ref 3.87–5.11)
RDW: 15.4 % (ref 11.5–15.5)
WBC: 11.2 10*3/uL — ABNORMAL HIGH (ref 4.0–10.5)

## 2015-06-03 LAB — MAGNESIUM: Magnesium: 1.7 mg/dL (ref 1.7–2.4)

## 2015-06-03 LAB — GLUCOSE, CAPILLARY
GLUCOSE-CAPILLARY: 126 mg/dL — AB (ref 65–99)
GLUCOSE-CAPILLARY: 155 mg/dL — AB (ref 65–99)
GLUCOSE-CAPILLARY: 168 mg/dL — AB (ref 65–99)
Glucose-Capillary: 152 mg/dL — ABNORMAL HIGH (ref 65–99)
Glucose-Capillary: 182 mg/dL — ABNORMAL HIGH (ref 65–99)
Glucose-Capillary: 192 mg/dL — ABNORMAL HIGH (ref 65–99)

## 2015-06-03 MED ORDER — POTASSIUM CHLORIDE CRYS ER 20 MEQ PO TBCR
40.0000 meq | EXTENDED_RELEASE_TABLET | Freq: Once | ORAL | Status: AC
  Administered 2015-06-03: 40 meq via ORAL
  Filled 2015-06-03: qty 2

## 2015-06-03 MED ORDER — HYDROMORPHONE HCL 1 MG/ML IJ SOLN: 1.0000 mg | INTRAMUSCULAR | Status: DC | PRN

## 2015-06-03 MED ORDER — OXYCODONE HCL 5 MG PO TABS
5.0000 mg | ORAL_TABLET | ORAL | Status: DC | PRN
  Administered 2015-06-03: 15 mg via ORAL
  Filled 2015-06-03: qty 3

## 2015-06-03 MED ORDER — TRACE MINERALS CR-CU-MN-SE-ZN 10-1000-500-60 MCG/ML IV SOLN
INTRAVENOUS | Status: AC
  Administered 2015-06-03: 18:00:00 via INTRAVENOUS
  Filled 2015-06-03: qty 960

## 2015-06-03 MED ORDER — SODIUM CHLORIDE 0.9 % IV BOLUS (SEPSIS)
500.0000 mL | Freq: Once | INTRAVENOUS | Status: AC
  Administered 2015-06-03: 500 mL via INTRAVENOUS

## 2015-06-03 MED ORDER — METHOCARBAMOL 500 MG PO TABS: 1000.0000 mg | ORAL_TABLET | Freq: Three times a day (TID) | ORAL | Status: DC | PRN

## 2015-06-03 MED ORDER — HYDROCORTISONE NA SUCCINATE PF 100 MG IJ SOLR
25.0000 mg | Freq: Every day | INTRAMUSCULAR | Status: DC
  Administered 2015-06-04 – 2015-06-06 (×3): 25 mg via INTRAVENOUS
  Filled 2015-06-03 (×3): qty 2

## 2015-06-03 MED ORDER — OXYCODONE HCL 5 MG PO TABS
5.0000 mg | ORAL_TABLET | ORAL | Status: DC | PRN
  Administered 2015-06-03 – 2015-06-05 (×7): 10 mg via ORAL
  Administered 2015-06-05: 5 mg via ORAL
  Administered 2015-06-05: 10 mg via ORAL
  Administered 2015-06-05 – 2015-06-08 (×10): 5 mg via ORAL
  Filled 2015-06-03: qty 2
  Filled 2015-06-03: qty 1
  Filled 2015-06-03 (×2): qty 2
  Filled 2015-06-03: qty 1
  Filled 2015-06-03: qty 2
  Filled 2015-06-03 (×3): qty 1
  Filled 2015-06-03 (×4): qty 2
  Filled 2015-06-03 (×6): qty 1

## 2015-06-03 NOTE — Progress Notes (Signed)
[place] NOTE  Pharmacy Consult for TPN Indication: SBO, expected prolonged ileus  Allergies  Allergen Reactions  . Aspirin     Patient Measurements: Height: '5\' 6"'  (167.6 cm) Weight: 170 lb 6.7 oz (77.3 kg) IBW/kg (Calculated) : 59.3  Vital Signs: Temp: 98.8 F (37.1 C) (06/29 0412) Temp Source: Oral (06/29 0412) BP: 100/57 mmHg (06/29 0412) Pulse Rate: 111 (06/29 0635) Intake/Output from previous day: 06/28 0701 - 06/29 0700 In: 2713.1 [P.O.:480; TPN:2233.1] Out: 1575 [Drains:1575] Intake/Output from this shift:    Labs:  Recent Labs  06/01/15 0628 06/03/15 0440  WBC 7.5 11.2*  HGB 7.9* 7.4*  HCT 22.8* 21.1*  PLT 212 247     Recent Labs  06/01/15 0628 06/02/15 0640 06/03/15 0440  NA 136 134* 137  K 4.3 4.2 3.4*  CL 111 108 104  CO2 21* 22 24  GLUCOSE 196* 205* 163*  BUN 19 27* 31*  CREATININE 0.92 0.98 1.11*  CALCIUM 7.9* 7.5* 7.4*  MG 1.9 2.0 1.7  PHOS 3.6 3.8  --   PROT 4.1*  --  4.0*  ALBUMIN 1.3*  --  1.2*  AST 43*  --  27  ALT 54  --  44  ALKPHOS 179*  --  210*  BILITOT 1.4*  --  2.3*  PREALBUMIN 6.2*  --   --   TRIG 316*  --   --    Estimated Creatinine Clearance: 53 mL/min (by C-G formula based on Cr of 1.11).    Recent Labs  06/02/15 1959 06/03/15 0003 06/03/15 0352  GLUCAP 157* 126* 152*   Insulin Requirements in the past 24 hours:  11 units sensitive SSI + 30 units regular insulin in TPN bag  Assessment: 54 yof with presenting 05/22/15 with worsening N/V and abdominal pain. Duration of symptoms 2 months and has lost 11-12 lb in last month. Nutrition notes patient with moderate depletion in body fat, depletions in muscle mass. Patient found to have SBO likely due to pelvic mass - anticipated prolonged ileus post-op and patient has been NPO x ~5 days inpatient. Pharmacy consulted to initiate TPN for SBO and anticipated prolonged ileus post-op. Expected d/c date 6/30 per case management  note  Surgeries/Procedures: 6/23: ex-lap w/ SB to L colon bypass - palliative gastrostomy tube  GI: SBO likely due to pelvic mass - anticipated prolonged ileus post-op. Prealbumin 9 on admit >> 6.2. Did not tolerate sips of clears on 6/21. Throwing up bile and having BMs on 6/22. Failed non-operative management - NGT clamped on 6/21, replaced 6/22 with continued vomiting. 6/23: Ex-lap w/ SB to L colon bypass (palliative) - gastrostomy tube placement. 1774m of output from drain in 24h (increased). PPI to po. Transferred out of ICU on 6/26. Ileus resolving. Sips of clears started 6/27, full liquids on 6/28 >> 2 small BMs - to clamp Gtube today and advance to soft diet (tolerating full liquids well, no nausea). Spoke with family - patient eating, tolerating well but is cautious about type of food and advancing slowly (reports taking in ~30% of full liquid meals but anxious to eat today)  Endo: No DM hx, CBGs controlled prior to start of TPN and OR. Tapering stress dose steroids to off in next few days (decreased hydrocortisone to 23mIV q12h). CBGs controlled 152-157 in last 24h  Lytes: Na normalized, K 3.4 with no replacement yesterday, kdur 4073mordered per MD x 1 today (goal >/=4 with ileus), Mag 1.7 with no replacement yesterday (goal >/=2  with ileus), corCa 9.7  Renal: SCr up slightly 1.11, CrCl~50-55, I/Os appear innaccurate. lasix 20 IV x 1 dose again today  Cards: HTN, HLD. Hypotension/tachycardia post-OR. Off pressors - BP wnl, HR ok  Hepatobil: LFTs normalized. Alk phos increased to 210. TBili increased to 2.3 (pt does not appear jaundiced). TG 316 on 6/27 - will monitor  Neuro: Anxiety. 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 Hx breast cancer s/p mastectomy. Chronic anemia. Hgb 7.4 stable, plts wnl. No overt bleeding noted  ID: Afebrile, wbc wnl, no antibiotics 6/17 BCx4>>ngf 6/17 UC>>ngf  Best  Practices: enox40, PPI po TPN Access: PICC placed 05/27/15 TPN start date: 05/27/15>>  Current Nutrition:  Clinimix E 5/15 '@83'  ml/h + 20% IVFE '@10'  ml/h- provides 100g protein and 1843kcal in 24h period Soft diet started this AM  Nutritional Goals:  1750-1950 kCal, 90-100 grams of protein per day (per Nutrition assessment)  Plan: - Wean Clinimix E 5/15 to 15m/hr and remove IVFE with advancement to soft diet - provides 48g protein and 682kcal  - Multivitamin and trace elements daily in TPN - Decrease regular insulin in TPN bag to 10 units + continue sensitive SSI and monitor CBGs - Kdur 488m x 1 dose ordered per MD - will not further supplement electrolytes as patient has soft diet now - F/u toleration of soft diet and ability to wean TPN to off - TPN labs in AM  HaElicia LampPharmD Clinical Pharmacist - Resident Pager [phone]/29/2016 8:27 AM

## 2015-06-03 NOTE — Progress Notes (Signed)
PT Cancellation Note  Patient Details Name: Ashley Hurley MRN: [id number] DOB: 07/14/1951   Cancelled Treatment:    Reason Eval/Treat Not Completed: Fatigue/lethargy limiting ability to participate, per nsg, patient family requesting not to be interrupted while patient attempting to sleep. Will hold PT evaluation at this time. Will attempt to reassess when patient arouses at time permits.   Duncan Dull 06/03/2015, 11:46 AM Alben Deeds, PT DPT  [phone]

## 2015-06-03 NOTE — Evaluation (Signed)
Physical Therapy Evaluation Patient Details Name: Ashley Hurley MRN: [id number] DOB: January 25, 1951 Today's Date: 06/03/2015   History of Present Illness  64 y.o. F with hx breast CA s/p mastectomy, admitted 6/17 with N/V and abd pain. Found to have pancreatic mass, liver mass, LAD, SBO. Failed conservative management for SBO therefor taken to OR 6/23 for ex lap.  Clinical Impression  Patient demonstrates deficits in functional mobility as indicated below. Will benefit from continued skilled PT to address deficits and maximize function. Will see as indicated and progress as tolerated. Encourage continued mobility, recommend HHPT and RW as well as BSC upon acute discharge.  Spoke with patient and partner at length regarding mobility expectations, pain management strategies, positions for comfort and techniques for mobility. Discussed recommendation of HHPT and equipment. Patient receptive.     Follow Up Recommendations Home health PT;Supervision/Assistance - 24 hour    Equipment Recommendations  Rolling walker with 5" wheels;3in1 (PT)    Recommendations for Other Services       Precautions / Restrictions Precautions Precautions: Fall Restrictions Weight Bearing Restrictions: No      Mobility  Bed Mobility Overal bed mobility: Needs Assistance Bed Mobility: Rolling;Sidelying to Sit;Sit to Supine Rolling: Min guard Sidelying to sit: Min assist   Sit to supine: Min assist   General bed mobility comments: VCs for technique and positioning, assist to elevate trunk to EOB and elevate LEs to return to supine, increased time to perform  Transfers Overall transfer level: Needs assistance Equipment used: Rolling walker (2 wheeled) Transfers: Sit to/from Stand Sit to Stand: Min guard;Min assist         General transfer comment: Min guard to elevate to standing from higher bed surface with VCs for safety and hand placement using RW, Min assist to elevate to standing from  toilet  Ambulation/Gait Ambulation/Gait assistance: Min guard;Supervision Ambulation Distance (Feet): 240 Feet Assistive device: Rolling walker (2 wheeled) Gait Pattern/deviations: Step-through pattern;Decreased stride length;Drifts right/left;Trunk flexed Gait velocity: decreased Gait velocity interpretation: Below normal speed for age/gender General Gait Details: VCs for increased cadence and upright posture, cues for positioing within Rw during ambulation, modest instability noted, improved with cues  Stairs Stairs: Yes Stairs assistance: Min guard Stair Management: One rail Left;Step to pattern;Forwards Number of Stairs: 2 General stair comments: no physical assist required, cues for step to technique  Wheelchair Mobility    Modified Rankin (Stroke Patients Only)       Balance Overall balance assessment: Needs assistance   Sitting balance-Leahy Scale: Fair Sitting balance - Comments: able to sit EOB without assist   Standing balance support: Bilateral upper extremity supported;During functional activity Standing balance-Leahy Scale: Fair Standing balance comment: instability noted, increased sway, benefits from assist                             Pertinent Vitals/Pain Pain Assessment: 0-10 Pain Score: 5  Pain Location: abdominal pain Pain Descriptors / Indicators: Operative site guarding Pain Intervention(s): Monitored during session;Repositioned;Relaxation    Home Living Family/patient expects to be discharged to:: Private residence Living Arrangements: Spouse/significant other Available Help at Discharge: Family Type of Home: House Home Access: Stairs to enter   Technical brewer of Steps: 6 Home Layout: Two level Home Equipment: None      Prior Function Level of Independence: Independent               Hand Dominance   Dominant Hand: Right    Extremity/Trunk Assessment  Upper Extremity Assessment: Overall WFL for tasks  assessed           Lower Extremity Assessment: Generalized weakness      Cervical / Trunk Assessment:  (abdominal incision)  Communication   Communication: No difficulties  Cognition Arousal/Alertness: Awake/alert Behavior During Therapy: WFL for tasks assessed/performed Overall Cognitive Status: Within Functional Limits for tasks assessed                      General Comments      Exercises        Assessment/Plan    PT Assessment Patient needs continued PT services  PT Diagnosis Difficulty walking;Abnormality of gait;Generalized weakness;Acute pain   PT Problem List Decreased strength;Decreased range of motion;Decreased activity tolerance;Decreased balance;Decreased mobility;Decreased safety awareness;Pain  PT Treatment Interventions DME instruction;Gait training;Stair training;Functional mobility training;Therapeutic activities;Therapeutic exercise;Balance training;Patient/family education   PT Goals (Current goals can be found in the Care Plan section) Acute Rehab PT Goals Patient Stated Goal: to go home PT Goal Formulation: With patient/family Time For Goal Achievement: 06/17/15 Potential to Achieve Goals: Good    Frequency Min 3X/week   Barriers to discharge        Co-evaluation               End of Session   Activity Tolerance: Patient tolerated treatment well;No increased pain Patient left: in bed;with call bell/phone within reach;with family/visitor present Nurse Communication: Mobility status         Time: 0174-9449 PT Time Calculation (min) (ACUTE ONLY): 26 min   Charges:   PT Evaluation $Initial PT Evaluation Tier I: 1 Procedure PT Treatments $Therapeutic Activity: 8-22 mins   PT G CodesDuncan Dull 2015-06-18, 2:20 PM Alben Deeds, [place] DPT  [phone]

## 2015-06-03 NOTE — Telephone Encounter (Signed)
[facility]- SPOKE WITH PATIENT PARTNER ROSE MARIA VERDELLE(POA) AND GAVE APPT FOR 07/15 @ 8:15 W/DR. SHERRILL

## 2015-06-03 NOTE — Progress Notes (Signed)
PROGRESS NOTE    Ashley Hurley OMV:[id number] DOB: 1951-07-14 DOA: 05/22/2015 PCP: Lottie Dawson, MD  HPI/Brief narrative 64 y.o. F with hx breast CA s/p mastectomy, admitted 6/17 with N/V and abd pain. Found to have pancreatic mass, liver mass, SBO. Failed conservative management for SBO therefor taken to OR 6/23 for ex lap. Following surgery, she remained hypotensive; therefore, PCCM was consulted. Patient transferred to [facility] on 6/27.   Assessment/Plan:  Active Problems: SBO secondary to pancreatic mass/adenocarcinoma - s/p ex lap 6/23, with ileocolonic bypass (palliative), gastrostomy tube placement (Dr. Marlou Starks), post op day #6 - Pancreatic mass - unresectable per Dr. Marlou Starks. - ileus improving, diet being advanced to soft diet - encouraged ambulation and IS, pt in good spirits  - G-tube clamped 6/29, staples to be discontinued POD #10 - Management per general surgery-follow-up appreciated  Severe PCM - in the setting of the acute on chronic illness - slowly advancing diet as noted above  - Dietitian following  Newly discovered pancreatobiliary adenocarcinoma  - biopsy proven 6/20 (liver bx via IR) - biopsy results positive for adenocarcinoma (pancreatobiliary) with liver mets - Dr. Doyle Askew spoke with Dr. Benay Spice: he will try to see pt on Thursday as pt will likely still be hospitalized until Thursday   Post op hypotension secondary to hypovolemia and adrenal insufficiency  - continue to taper down stress dose steroids - Improved  Anemia of chronic disease, malignancy - no signs of active bleeding - Stable. Transfuse if hemoglobin <7 g per DL.  Acute hypoxic respiratory failure - post op atelectasis and effusion at the right lung base, now resolved   Right hydroureteronephrosis  - secondary to a soft tissue mass in the right lower pelvis - Urologist recommend conservative management of asymptomatic hydronephrosis at this time.  - since Cr trending down, no  stenting needed at this time  - Creatinine has remained normal.  Acute renal failure - Secondary to right hydroureteronephrosis and prerenal etiology from dehydration in the setting of SBO - IV fluids provided, creatinine remains WNL  - IVF have been discontinued 6/27 due to volume overload  - Monitor creatinine periodically  Volume overload in the setting of severe hypoalbuminemia and post op hypotension requiring IVF - at baseline ~ 130 lbs but now up to 170's lbs - We'll gradually get better with improving nutritional status - No significant diuresis with IV Lasix given on 6/28  Hypokalemia - Replace as needed and follow    DVT prophylaxis - Lovenox SQ Code Status: Full.  Family Communication: None at bedside. Disposition Plan: Home possibly on Thursday per surgery team    SIGNIFICANT EVENTS: 6/17 - admit. 6/20 - liver biopsy (pathology positive for adenocarcinoma - pancreatobiliary). 6/23 - ex lap with SB to left colon bypass (palliative), gastrostomy tube placement. 6/26 - transferred from ICU to medical floor  6/27 - [facility] from [facility] to [facility] care; on TPN 6/28 - Tolerating clear liquids, has had BM 6/29-G-tube clamped and starting soft diet  Consultants:  Surgery  Oncology  Procedures: 6/20 - liver biopsy (pathology positive for adenocarcinoma - pancreatobiliary). 6/23 - ex lap with SB to left colon bypass (palliative), gastrostomy tube placement. - RUE PICC 6/22 >>>  Antibiotics:  None   Subjective: Intermittent cramping abdominal pain. Tolerated liquids yesterday and has been advanced to soft diet-tolerated breakfast. Last BM 6/26. Denies dyspnea.  Objective: Filed Vitals:   06/03/15 0305 06/03/15 0412 06/03/15 0440 06/03/15 0635  BP:  100/57    Pulse: 132 119  120 111  Temp:  98.8 F (37.1 C)    TempSrc:  Oral    Resp:  18    Height:      Weight:      SpO2:  97%      Intake/Output Summary (Last 24 hours) at 06/03/15 1234 Last data  filed at 06/03/15 0900  Gross per 24 hour  Intake 2833.05 ml  Output   1176 ml  Net 1657.05 ml   Filed Weights   05/31/15 1600 06/01/15 0456 06/02/15 0553  Weight: 76.34 kg (168 lb 4.8 oz) 76.64 kg (168 lb 15.4 oz) 77.3 kg (170 lb 6.7 oz)     Exam:  General exam: Pleasant middle-aged female lying comfortably propped up in bed. Respiratory system: Slightly diminished breath sounds in the bases but otherwise clear to auscultation. No increased work of breathing. Cardiovascular system: S1 & S2 heard, RRR. No JVD, murmurs, gallops, clicks. Trace bilateral ankle edema. Not on telemetry. Gastrointestinal system: Abdomen is nondistended, soft and mild appropriate tenderness. Dressing over surgical site clean and dry. Normal bowel sounds heard. Central nervous system: Alert and oriented. No focal neurological deficits. Extremities: Symmetric 5 x 5 power.   Data Reviewed: Basic Metabolic Panel:  Recent Labs Lab 05/28/15 0500  05/30/15 0441 05/31/15 0428 06/01/15 0628 06/02/15 0640 06/03/15 0440  NA 138  < > 135 138 136 134* 137  K 3.6  < > 3.8 4.2 4.3 4.2 3.4*  CL 108  < > 108 112* 111 108 104  CO2 23  < > 20* 22 21* 22 24  GLUCOSE 158*  < > 187* 201* 196* 205* 163*  BUN 20  < > 15 16 19  27* 31*  CREATININE 1.15*  < > 0.95 0.89 0.92 0.98 1.11*  CALCIUM 8.3*  < > 7.3* 7.4* 7.9* 7.5* 7.4*  MG 1.7  < > 1.7 2.2 1.9 2.0 1.7  PHOS 2.6  --  1.2* 2.9 3.6 3.8  --   < > = values in this interval not displayed. Liver Function Tests:  Recent Labs Lab 05/28/15 0500 05/31/15 0428 06/01/15 0628 06/03/15 0440  AST 19 57* 43* 27  ALT 18 46 54 44  ALKPHOS 74 111 179* 210*  BILITOT 0.5 1.9* 1.4* 2.3*  PROT 4.8* 4.0* 4.1* 4.0*  ALBUMIN 2.0* 1.2* 1.3* 1.2*   No results for input(s): LIPASE, AMYLASE in the last 168 hours. No results for input(s): AMMONIA in the last 168 hours. CBC:  Recent Labs Lab 05/29/15 0410 05/29/15 0730 05/31/15 0428 06/01/15 0628 06/03/15 0440  WBC 15.0*  13.4* 8.8 7.5 11.2*  NEUTROABS  --  11.4* 7.4 6.0  --   HGB 7.5* 8.4* 7.6* 7.9* 7.4*  HCT 22.0* 24.4* 22.2* 22.8* 21.1*  MCV 92.8 91.0 91.7 90.5 89.4  PLT 315 260 190 212 247   Cardiac Enzymes:  Recent Labs Lab 05/29/15 0730  TROPONINI 0.04*   BNP (last 3 results) No results for input(s): PROBNP in the last 8760 hours. CBG:  Recent Labs Lab 06/02/15 1959 06/03/15 0003 06/03/15 0352 06/03/15 0838 06/03/15 1150  GLUCAP 157* 126* 152* 155* 182*    Recent Results (from the past 240 hour(s))  Surgical pcr screen     Status: Abnormal   Collection Time: 05/28/15  7:39 AM  Result Value Ref Range Status   MRSA, PCR NEGATIVE NEGATIVE Final   Staphylococcus aureus POSITIVE (A) NEGATIVE Final    Comment:        The Xpert SA Assay (FDA  approved for NASAL specimens in patients over 68 years of age), is one component of a comprehensive surveillance program.  Test performance has been validated by [facility] for patients greater than or equal to 45 year old. It is not intended to diagnose infection nor to guide or monitor treatment.         Studies: No results found.      Scheduled Meds: . antiseptic oral rinse  7 mL Mouth Rinse q12n4p  . chlorhexidine  15 mL Mouth Rinse BID  . enoxaparin (LOVENOX) injection  40 mg Subcutaneous QHS  . escitalopram  20 mg Oral Daily  . furosemide  20 mg Intravenous Daily  . hydrocortisone sodium succinate  25 mg Intravenous Q12H  . insulin aspart  0-9 Units Subcutaneous 6 times per day  . pantoprazole  40 mg Oral Daily   Continuous Infusions: . Marland KitchenTPN (CLINIMIX-E) Adult    . Marland KitchenTPN (CLINIMIX-E) Adult 83 mL/hr at 06/02/15 1727   And  . fat emulsion 240 mL (06/02/15 1730)    Active Problems:   Liver mass, left lobe   Pancreatic mass   SBO (small bowel obstruction)   Abdominal pain   Protein-calorie malnutrition, severe   Liver mass   Nausea and vomiting   Arterial hypotension   Atelectasis   Hydroureter, right    Pulmonary edema    Time spent: 25 minutes    HONGALGI,ANAND, MD, FACP, FHM. [facility] Pager [phone]  If 7PM-7AM, please contact night-coverage [email or web address] Password [facility] 06/03/2015, 12:34 PM    LOS: 12 days

## 2015-06-03 NOTE — Progress Notes (Signed)
6 Days Post-Op  Subjective: Tolerated fulls, no nausea.  Objective: Vital signs in last 24 hours: Temp:  [97.9 F (36.6 C)-98.8 F (37.1 C)] 98.8 F (37.1 C) (06/29 0412) Pulse Rate:  [98-132] 111 (06/29 0635) Resp:  [16-18] 18 (06/29 0412) BP: (100-117)/(57-71) 100/57 mmHg (06/29 0412) SpO2:  [97 %-100 %] 97 % (06/29 0412) Last BM Date: 06/01/15  Intake/Output from previous day: 06/28 0701 - 06/29 0700 In: 2713.1 [P.O.:480; TPN:2233.1] Out: 1575 [Drains:1575] Intake/Output this shift:    General appearance: cooperative Resp: clear to auscultation bilaterally GI: soft, midline wound clean, +BS  Lab Results:   Recent Labs  06/01/15 0628 06/03/15 0440  WBC 7.5 11.2*  HGB 7.9* 7.4*  HCT 22.8* 21.1*  PLT 212 247   BMET  Recent Labs  06/02/15 0640 06/03/15 0440  NA 134* 137  K 4.2 3.4*  CL 108 104  CO2 22 24  GLUCOSE 205* 163*  BUN 27* 31*  CREATININE 0.98 1.11*  CALCIUM 7.5* 7.4*   PT/INR No results for input(s): LABPROT, INR in the last 72 hours. ABG No results for input(s): PHART, HCO3 in the last 72 hours.  Invalid input(s): PCO2, PO2  Studies/Results: No results found.  Anti-infectives: Anti-infectives    Start     Dose/Rate Route Frequency Ordered Stop   05/28/15 0930  cefOXitin (MEFOXIN) 2 g in dextrose 5 % 50 mL IVPB    Comments:  Pharmacy may adjust dosing strength, interval, or rate of medication as needed for optimal therapy for the patient Send with patient on call to the OR.  Anesthesia to complete antibiotic administration <16min prior to incision per [facility].   2 g 100 mL/hr over 30 Minutes Intravenous On call to O.R. 05/28/15 0919 05/28/15 0930   05/28/15 0600  cefOXitin (MEFOXIN) 2 g in dextrose 5 % 50 mL IVPB  Status:  Discontinued    Comments:  Pharmacy may adjust dosing strength, interval, or rate of medication as needed for optimal therapy for the patient Send with patient on call to the OR.  Anesthesia to complete  antibiotic administration <68min prior to incision per [facility].   2 g 100 mL/hr over 30 Minutes Intravenous On call to O.R. 05/27/15 1320 05/27/15 1328      Assessment/Plan: Pancreatic mass - adenocarcinoma -Unresectable SBO secondary to above POD #6 s/p ex lap, ileocolonic bypass  - clamp G tube and advance to soft diet - Staples to be discontinued POD #10  LOS: 12 days    Braxon Suder E 06/03/2015

## 2015-06-03 NOTE — Progress Notes (Signed)
0440 Normal Saline bolus has finish heart rated now ranging between 119-122 regular. Patient voices no c/o discomfort. Will continue to monitor.

## 2015-06-03 NOTE — Progress Notes (Signed)
0350 Patient had call to go to bathroom and when the nurse tech went in patient was sitting up on the side of the bed and stated she had awaken sitting on the side of the bed. Nurse tech stated patient was a little disoriented and gait very unsteady walking to bathroom but patient now alert and oriented x4. Vital signs was B/P 109/71, P131, O2 sat 98 RA. Patient states she feels better now. 0305 Patient pulse 132 regular. Chaney Malling NP notified of elevated heart rate orders received. 0320 Normal Saline bolus started 570ml. Patient c/o abd pain and Dilaudid 1mg  given IV. Will continue to monitor.

## 2015-06-04 LAB — COMPREHENSIVE METABOLIC PANEL
ALK PHOS: 162 U/L — AB (ref 38–126)
ALT: 40 U/L (ref 14–54)
ANION GAP: 4 — AB (ref 5–15)
AST: 25 U/L (ref 15–41)
Albumin: 1.2 g/dL — ABNORMAL LOW (ref 3.5–5.0)
BUN: 33 mg/dL — ABNORMAL HIGH (ref 6–20)
CO2: 25 mmol/L (ref 22–32)
Calcium: 7.4 mg/dL — ABNORMAL LOW (ref 8.9–10.3)
Chloride: 104 mmol/L (ref 101–111)
Creatinine, Ser: 1.11 mg/dL — ABNORMAL HIGH (ref 0.44–1.00)
GFR calc Af Amer: 59 mL/min — ABNORMAL LOW (ref >60)
GFR, EST NON AFRICAN AMERICAN: 51 mL/min — AB (ref >60)
Glucose, Bld: 178 mg/dL — ABNORMAL HIGH (ref 65–99)
Potassium: 3.2 mmol/L — ABNORMAL LOW (ref 3.5–5.1)
Sodium: 133 mmol/L — ABNORMAL LOW (ref 135–145)
Total Bilirubin: 3.3 mg/dL — ABNORMAL HIGH (ref 0.3–1.2)
Total Protein: 4.1 g/dL — ABNORMAL LOW (ref 6.5–8.1)

## 2015-06-04 LAB — GLUCOSE, CAPILLARY
Glucose-Capillary: 137 mg/dL — ABNORMAL HIGH (ref 65–99)
Glucose-Capillary: 138 mg/dL — ABNORMAL HIGH (ref 65–99)
Glucose-Capillary: 155 mg/dL — ABNORMAL HIGH (ref 65–99)
Glucose-Capillary: 158 mg/dL — ABNORMAL HIGH (ref 65–99)
Glucose-Capillary: 163 mg/dL — ABNORMAL HIGH (ref 65–99)
Glucose-Capillary: 163 mg/dL — ABNORMAL HIGH (ref 65–99)
Glucose-Capillary: 180 mg/dL — ABNORMAL HIGH (ref 65–99)

## 2015-06-04 LAB — CBC
HCT: 18.3 % — ABNORMAL LOW (ref 36.0–46.0)
HCT: 22.3 % — ABNORMAL LOW (ref 36.0–46.0)
HEMATOCRIT: 19.8 % — AB (ref 36.0–46.0)
HEMOGLOBIN: 6.4 g/dL — AB (ref 12.0–15.0)
Hemoglobin: 7 g/dL — ABNORMAL LOW (ref 12.0–15.0)
Hemoglobin: 7.9 g/dL — ABNORMAL LOW (ref 12.0–15.0)
MCH: 31.1 pg (ref 26.0–34.0)
MCH: 31.8 pg (ref 26.0–34.0)
MCH: 30.3 pg (ref 26.0–34.0)
MCHC: 35 g/dL (ref 30.0–36.0)
MCHC: 35.4 g/dL (ref 30.0–36.0)
MCHC: 35.4 g/dL (ref 30.0–36.0)
MCV: 88.8 fL (ref 78.0–100.0)
MCV: 90 fL (ref 78.0–100.0)
MCV: 85.4 fL (ref 78.0–100.0)
PLATELETS: 208 10*3/uL (ref 150–400)
Platelets: 199 10*3/uL (ref 150–400)
Platelets: 213 10*3/uL (ref 150–400)
RBC: 2.06 MIL/uL — AB (ref 3.87–5.11)
RBC: 2.2 MIL/uL — AB (ref 3.87–5.11)
RBC: 2.61 MIL/uL — ABNORMAL LOW (ref 3.87–5.11)
RDW: 15.5 % (ref 11.5–15.5)
RDW: 15.4 % (ref 11.5–15.5)
RDW: 16.5 % — AB (ref 11.5–15.5)
WBC: 9.1 10*3/uL (ref 4.0–10.5)
WBC: 10.2 10*3/uL (ref 4.0–10.5)
WBC: 10.5 10*3/uL (ref 4.0–10.5)

## 2015-06-04 LAB — MAGNESIUM: Magnesium: 1.7 mg/dL (ref 1.7–2.4)

## 2015-06-04 LAB — PREPARE RBC (CROSSMATCH)

## 2015-06-04 LAB — ABO/RH: ABO/RH(D): O NEG

## 2015-06-04 LAB — PHOSPHORUS: PHOSPHORUS: 3.3 mg/dL (ref 2.5–4.6)

## 2015-06-04 MED ORDER — LIDOCAINE HCL (CARDIAC) 20 MG/ML IV SOLN
INTRAVENOUS | Status: AC
  Administered 2015-06-04: 15:00:00
  Filled 2015-06-04: qty 5

## 2015-06-04 MED ORDER — PIPERACILLIN-TAZOBACTAM 3.375 G IVPB 30 MIN
3.3750 g | Freq: Once | INTRAVENOUS | Status: AC
  Administered 2015-06-04: 3.375 g via INTRAVENOUS
  Filled 2015-06-04: qty 50

## 2015-06-04 MED ORDER — VANCOMYCIN HCL 10 G IV SOLR
1500.0000 mg | Freq: Once | INTRAVENOUS | Status: AC
  Administered 2015-06-04: 1500 mg via INTRAVENOUS
  Filled 2015-06-04: qty 1500

## 2015-06-04 MED ORDER — PIPERACILLIN-TAZOBACTAM 3.375 G IVPB
3.3750 g | Freq: Three times a day (TID) | INTRAVENOUS | Status: DC
  Administered 2015-06-04 – 2015-06-08 (×11): 3.375 g via INTRAVENOUS
  Filled 2015-06-04 (×15): qty 50

## 2015-06-04 MED ORDER — VANCOMYCIN HCL IN DEXTROSE 750-5 MG/150ML-% IV SOLN
750.0000 mg | Freq: Two times a day (BID) | INTRAVENOUS | Status: DC
  Administered 2015-06-05 – 2015-06-08 (×7): 750 mg via INTRAVENOUS
  Filled 2015-06-04 (×9): qty 150

## 2015-06-04 MED ORDER — SODIUM CHLORIDE 0.9 % IV SOLN: Freq: Once | INTRAVENOUS | Status: AC

## 2015-06-04 MED ORDER — TRACE MINERALS CR-CU-MN-SE-ZN 10-1000-500-60 MCG/ML IV SOLN
INTRAVENOUS | Status: AC
  Administered 2015-06-04: 18:00:00 via INTRAVENOUS
  Filled 2015-06-04: qty 960

## 2015-06-04 MED ORDER — ENSURE ENLIVE PO LIQD
237.0000 mL | Freq: Two times a day (BID) | ORAL | Status: DC
  Administered 2015-06-05 – 2015-06-07 (×4): 237 mL via ORAL

## 2015-06-04 MED ORDER — POTASSIUM CHLORIDE CRYS ER 10 MEQ PO TBCR
30.0000 meq | EXTENDED_RELEASE_TABLET | ORAL | Status: AC
  Administered 2015-06-04 (×2): 30 meq via ORAL
  Filled 2015-06-04 (×4): qty 1

## 2015-06-04 MED ORDER — MAGNESIUM SULFATE 2 GM/50ML IV SOLN
2.0000 g | Freq: Once | INTRAVENOUS | Status: AC
  Administered 2015-06-04: 2 g via INTRAVENOUS
  Filled 2015-06-04: qty 50

## 2015-06-04 NOTE — Progress Notes (Signed)
Nutrition Follow-up  DOCUMENTATION CODES:  Severe malnutrition in context of chronic illness  INTERVENTION:  -TPN management per pharmacy -Ensure Enlive po BID, each supplement provides 350 kcal and 20 grams of protein  NUTRITION DIAGNOSIS:  Malnutrition related to acute illness as evidenced by moderate depletion of body fat, moderate depletions of muscle mass.  Ongoing  GOAL:  Patient will meet greater than or equal to 90% of their needs  Progressing  MONITOR:  PO intake, Supplement acceptance, Diet advancement, Labs, Weight trends, Skin, I & O's  REASON FOR ASSESSMENT:  Consult New TPN/TNA  ASSESSMENT: 64 year old female with HTN, history of breast cancer, presented to [facility] ED with main concern of several days duration of progressively worsening abd pain associated with nausea and non bloody vomiting. Current work up notable for SBO, pancreatic and liver mass and was admitted for further management.  S/p Procedure(s) 05/28/15: EXPLORATORY LAPAROTOMY WITH SMALL BOWEL TO LEFT COLON BYPASS - Palliative GASTROSTOMY TUBE WITH 26 MALEncrodT (N/A)  G-tube was clamped on 06/03/15 and pt was advanced to a soft diet. Pt is tolerating PO intake, however, meal completion 25-50%.   Per pharmacy note, Clinimix E 5/15 infusing at 61mL/hr and IVFE removed with advancement to soft diet - provides 48g protein and 682 kcal (68% of estimated kcal needs 53% of protein needs). Pharmacy following to monitor ability to wean TPN.   RD will add Ensure Enlive supplement to maximize nutritional intake.   Height:  Ht Readings from Last 1 Encounters:  05/22/15 5\' 6"  (1.676 m)    Weight:  Wt Readings from Last 1 Encounters:  06/04/15 164 lb 3.2 oz (74.481 kg)    Ideal Body Weight:  59 kg  Wt Readings from Last 10 Encounters:  06/04/15 164 lb 3.2 oz (74.481 kg)  05/21/15 132 lb (59.875 kg)  05/19/15 131 lb 12.8 oz (59.784 kg)  05/14/15 132 lb 3.2 oz (59.966 kg)  05/01/15 139 lb (63.05  kg)  09/09/14 147 lb (66.679 kg)  08/14/13 147 lb (66.679 kg)  07/31/12 145 lb (65.772 kg)  07/17/12 145 lb 1.6 oz (65.817 kg)  05/23/12 145 lb (65.772 kg)    BMI:  Body mass index is 26.52 kg/(m^2).  Estimated Nutritional Needs:  Kcal:  [phone]  Protein:  90-100 grams  Fluid:  1.8-2.0 L  Skin:  Wound (see comment) (scab on rt breast, closed abdominal incision)  Diet Order:  DIET SOFT Room service appropriate?: Yes; Fluid consistency:: Thin .TPN (CLINIMIX-E) Adult  EDUCATION NEEDS:  No education needs identified at this time   Intake/Output Summary (Last 24 hours) at 06/04/15 1100 Last data filed at 06/03/15 2245  Gross per 24 hour  Intake    120 ml  Output      0 ml  Net    120 ml    Last BM:  06/03/15  Nayvie Lips A. Jimmye Norman, RD, LDN, CDE Pager: [phone] After hours Pager: [phone]

## 2015-06-04 NOTE — Progress Notes (Addendum)
PROGRESS NOTE    Ashley Hurley QVZ:[id number] DOB: 16-Sep-1951 DOA: 05/22/2015 PCP: Lottie Dawson, MD  HPI/Brief narrative 64 y.o. F with hx breast CA s/p mastectomy, admitted 6/17 with N/V and abd pain. Found to have pancreatic mass, liver mass, SBO. Failed conservative management for SBO therefor taken to OR 6/23 for ex lap. Following surgery, she remained hypotensive; therefore, PCCM was consulted. Patient transferred to [facility] on 6/27.   Assessment/Plan:  Active Problems: SBO secondary to pancreatic mass/adenocarcinoma - s/p ex lap 6/23, with ileocolonic bypass (palliative), gastrostomy tube placement (Dr. Marlou Starks), post op day #7 - Pancreatic mass - unresectable per Dr. Marlou Starks. - ileus resolved. - G-tube clamped 6/29, staples to be discontinued POD #10. Now complicated by large subcutaneous infected seroma-see below  - Management per general surgery-follow-up appreciated - Taper TPN  Large subcutaneous infected Seroma - At site of exploratory laparotomy - Wound now open. As per surgery dressing changes and probably can switch to Vac in a couple of days. - Started IV vancomycin and Zosyn.  Severe PCM - in the setting of the acute on chronic illness - slowly advancing diet as noted above  - Dietitian following  Newly discovered pancreatobiliary adenocarcinoma  - biopsy proven 6/20 (liver bx via IR) - biopsy results positive for adenocarcinoma (pancreatobiliary) with liver mets - Dr. Gearldine Shown brief note appreciated and discussed with him. He has arranged a follow-up appointment with him on 06/19/15.    Post op hypotension secondary to hypovolemia and adrenal insufficiency  - continue to taper down stress dose steroids - Improved  Anemia of chronic disease, malignancy - no signs of active bleeding - Stable. Transfuse if hemoglobin <7 g per DL. - Transfused 1 unit of PRBC for hemoglobin of 7 on 6/30. Hemoglobin improved to 7.9.  Acute hypoxic respiratory failure -  post op atelectasis and effusion at the right lung base, now resolved   Right hydroureteronephrosis  - secondary to a soft tissue mass in the right lower pelvis - Urologist recommend conservative management of asymptomatic hydronephrosis at this time.  - since Cr trending down, no stenting needed at this time  - Creatinine has remained normal.  Acute renal failure - Secondary to right hydroureteronephrosis and prerenal etiology from dehydration in the setting of SBO - IV fluids provided, creatinine remains WNL  - IVF have been discontinued 6/27 due to volume overload  - Monitor creatinine periodically  Volume overload in the setting of severe hypoalbuminemia and post op hypotension requiring IVF - at baseline ~ 130 lbs but now up to 170's lbs - Will gradually get better with improving nutritional status - No significant diuresis with IV Lasix given on 6/28  Hypokalemia - Replace as needed and follow    DVT prophylaxis - Lovenox SQ Code Status: Full.  Family Communication: Discussed with patient's significant other and a friend at bedside on 6/30.  Disposition Plan: Home and medically stable and cleared by surgery.     SIGNIFICANT EVENTS: 6/17 - admit. 6/20 - liver biopsy (pathology positive for adenocarcinoma - pancreatobiliary). 6/23 - ex lap with SB to left colon bypass (palliative), gastrostomy tube placement. 6/26 - transferred from ICU to medical floor  6/27 - Pt transferred from [facility] to [facility] care; on TPN 6/28 - Tolerating clear liquids, has had BM 6/29-G-tube clamped and starting soft diet  Consultants:  Surgery  Oncology  Procedures: 6/20 - liver biopsy (pathology positive for adenocarcinoma - pancreatobiliary). 6/23 - ex lap with SB to left colon bypass (palliative), gastrostomy  tube placement. - RUE PICC 6/22 >>>  Antibiotics:  IV vancomycin 6/30 >  IV Zosyn 6/30 >  Subjective: Tolerating diet. Had soft BM this morning. Mild intermittent abdominal  pain. Complained of leaking around the G-tube and laparotomy sutures. Anxious on going home.   Objective: Filed Vitals:   06/04/15 0628 06/04/15 1122 06/04/15 1137 06/04/15 1422  BP: 103/59 104/64 94/51 97/59   Pulse: 97 105 95 107  Temp: 98.4 F (36.9 C) 98.7 F (37.1 C) 98.6 F (37 C) 98.1 F (36.7 C)  TempSrc: Oral Oral Oral Oral  Resp: 16 16 16 18   Height:      Weight: 74.481 kg (164 lb 3.2 oz)     SpO2: 96% 97% 97% 98%    Intake/Output Summary (Last 24 hours) at 06/04/15 1611 Last data filed at 06/04/15 1422  Gross per 24 hour  Intake    455 ml  Output      0 ml  Net    455 ml   Filed Weights   06/01/15 0456 06/02/15 0553 06/04/15 0628  Weight: 76.64 kg (168 lb 15.4 oz) 77.3 kg (170 lb 6.7 oz) 74.481 kg (164 lb 3.2 oz)     Exam:  General exam: Pleasant middle-aged female in sitting up comfortably in chair this morning. Marland Kitchen Respiratory system: clear to auscultation. No increased work of breathing. Cardiovascular system: S1 & S2 heard, RRR. No JVD, murmurs, gallops, clicks. Trace bilateral ankle edema. Not on telemetry. Gastrointestinal system: Abdomen is nondistended, soft and mild appropriate tenderness. Dressing over surgical site and around G tube with mild soiling with yellowish color. Normal bowel sounds heard. Central nervous system: Alert and oriented. No focal neurological deficits. Extremities: Symmetric 5 x 5 power.   Data Reviewed: Basic Metabolic Panel:  Recent Labs Lab 05/30/15 0441 05/31/15 0428 06/01/15 0628 06/02/15 0640 06/03/15 0440 06/04/15 0510  NA 135 138 136 134* 137 133*  K 3.8 4.2 4.3 4.2 3.4* 3.2*  CL 108 112* 111 108 104 104  CO2 20* 22 21* 22 24 25   GLUCOSE 187* 201* 196* 205* 163* 178*  BUN 15 16 19  27* 31* 33*  CREATININE 0.95 0.89 0.92 0.98 1.11* 1.11*  CALCIUM 7.3* 7.4* 7.9* 7.5* 7.4* 7.4*  MG 1.7 2.2 1.9 2.0 1.7 1.7  PHOS 1.2* 2.9 3.6 3.8  --  3.3   Liver Function Tests:  Recent Labs Lab 05/31/15 0428 06/01/15 0628  06/03/15 0440 06/04/15 0510  AST 57* 43* 27 25  ALT 46 54 44 40  ALKPHOS 111 179* 210* 162*  BILITOT 1.9* 1.4* 2.3* 3.3*  PROT 4.0* 4.1* 4.0* 4.1*  ALBUMIN 1.2* 1.3* 1.2* 1.2*   No results for input(s): LIPASE, AMYLASE in the last 168 hours. No results for input(s): AMMONIA in the last 168 hours. CBC:  Recent Labs Lab 05/29/15 0730 05/31/15 0428 06/01/15 0628 06/03/15 0440 06/04/15 0510 06/04/15 0909 06/04/15 1535  WBC 13.4* 8.8 7.5 11.2* 9.1 10.2 10.5  NEUTROABS 11.4* 7.4 6.0  --   --   --   --   HGB 8.4* 7.6* 7.9* 7.4* 6.4* 7.0* 7.9*  HCT 24.4* 22.2* 22.8* 21.1* 18.3* 19.8* 22.3*  MCV 91.0 91.7 90.5 89.4 88.8 90.0 85.4  PLT 260 190 212 247 199 213 208   Cardiac Enzymes:  Recent Labs Lab 05/29/15 0730  TROPONINI 0.04*   BNP (last 3 results) No results for input(s): PROBNP in the last 8760 hours. CBG:  Recent Labs Lab 06/04/15 0022 06/04/15 8676 06/04/15 0746 06/04/15  1213 06/04/15 1545  GLUCAP 180* 158* 138* 155* 163*    Recent Results (from the past 240 hour(s))  Surgical pcr screen     Status: Abnormal   Collection Time: 05/28/15  7:39 AM  Result Value Ref Range Status   MRSA, PCR NEGATIVE NEGATIVE Final   Staphylococcus aureus POSITIVE (A) NEGATIVE Final    Comment:        The Xpert SA Assay (FDA approved for NASAL specimens in patients over 20 years of age), is one component of a comprehensive surveillance program.  Test performance has been validated by [facility] for patients greater than or equal to 7 year old. It is not intended to diagnose infection nor to guide or monitor treatment.         Studies: No results found.      Scheduled Meds: . antiseptic oral rinse  7 mL Mouth Rinse q12n4p  . chlorhexidine  15 mL Mouth Rinse BID  . enoxaparin (LOVENOX) injection  40 mg Subcutaneous QHS  . escitalopram  20 mg Oral Daily  . feeding supplement (ENSURE ENLIVE)  237 mL Oral BID BM  . furosemide  20 mg Intravenous Daily  .  hydrocortisone sodium succinate  25 mg Intravenous Daily  . insulin aspart  0-9 Units Subcutaneous 6 times per day  . magnesium sulfate 1 - 4 g bolus IVPB  2 g Intravenous Once  . pantoprazole  40 mg Oral Daily  . piperacillin-tazobactam (ZOSYN)  IV  3.375 g Intravenous Q8H  . vancomycin  1,500 mg Intravenous Once  . [START ON 06/05/2015] vancomycin  750 mg Intravenous Q12H   Continuous Infusions: . Marland KitchenTPN (CLINIMIX-E) Adult 40 mL/hr at 06/03/15 1752  . Marland KitchenTPN (CLINIMIX-E) Adult      Active Problems:   Liver mass, left lobe   Pancreatic mass   SBO (small bowel obstruction)   Abdominal pain   Protein-calorie malnutrition, severe   Liver mass   Nausea and vomiting   Arterial hypotension   Atelectasis   Hydroureter, right   Pulmonary edema    Time spent: 25 minutes    Aryelle Figg, MD, FACP, FHM. [facility] Pager [phone]  If 7PM-7AM, please contact night-coverage [email or web address] Password [email or web address] 06/04/2015, 4:11 PM    LOS: 13 days

## 2015-06-04 NOTE — Progress Notes (Signed)
[facility] Progress Note  7 Days Post-Op  Subjective: Pt doing okay, but having pain/pressure at the lower part of her incision.  Foul smell in room.  No N/V, tolerating diet well.  Ambulating well.  Was looking forward to going home.    Objective: Vital signs in last 24 hours: Temp:  [97.9 F (36.6 C)-98.4 F (36.9 C)] 98.4 F (36.9 C) (06/30 0628) Pulse Rate:  [93-109] 97 (06/30 0628) Resp:  [16] 16 (06/30 0628) BP: (95-103)/(59-64) 103/59 mmHg (06/30 0628) SpO2:  [94 %-96 %] 96 % (06/30 0628) Weight:  [74.481 kg (164 lb 3.2 oz)] 74.481 kg (164 lb 3.2 oz) (06/30 0628) Last BM Date: 06/01/15  Intake/Output from previous day: 06/29 0701 - 06/30 0700 In: 240 [P.O.:240] Out: 1 [Urine:1] Intake/Output this shift:    PE: Gen:  Alert, NAD, pleasant Abd: Soft, moderate tenderness with manipulation of wound and removal of staples, seropurulent, liquified slough coating the wound, 216mL expressed from wound, wound culture obtained, +BS, no HSM, midline wound with yellow slough and foul odor, no significant wound dehiscence.     Lab Results:   Recent Labs  06/04/15 0510 06/04/15 0909  WBC 9.1 10.2  HGB 6.4* 7.0*  HCT 18.3* 19.8*  PLT 199 213   BMET  Recent Labs  06/03/15 0440 06/04/15 0510  NA 137 133*  K 3.4* 3.2*  CL 104 104  CO2 24 25  GLUCOSE 163* 178*  BUN 31* 33*  CREATININE 1.11* 1.11*  CALCIUM 7.4* 7.4*   PT/INR No results for input(s): LABPROT, INR in the last 72 hours. CMP     Component Value Date/Time   NA 133* 06/04/2015 0510   K 3.2* 06/04/2015 0510   CL 104 06/04/2015 0510   CO2 25 06/04/2015 0510   GLUCOSE 178* 06/04/2015 0510   BUN 33* 06/04/2015 0510   CREATININE 1.11* 06/04/2015 0510   CALCIUM 7.4* 06/04/2015 0510   PROT 4.1* 06/04/2015 0510   ALBUMIN 1.2* 06/04/2015 0510   AST 25 06/04/2015 0510   ALT 40 06/04/2015 0510   ALKPHOS 162* 06/04/2015 0510   BILITOT 3.3* 06/04/2015 0510   GFRNONAA 51* 06/04/2015 0510   GFRAA 59* 06/04/2015 0510   Lipase     Component Value Date/Time   LIPASE 57* 05/22/2015 1153       Studies/Results: No results found.  Anti-infectives: Anti-infectives    Start     Dose/Rate Route Frequency Ordered Stop   05/28/15 0930  cefOXitin (MEFOXIN) 2 g in dextrose 5 % 50 mL IVPB    Comments:  Pharmacy may adjust dosing strength, interval, or rate of medication as needed for optimal therapy for the patient Send with patient on call to the OR.  Anesthesia to complete antibiotic administration <57min prior to incision per [facility].   2 g 100 mL/hr over 30 Minutes Intravenous On call to O.R. 05/28/15 0919 05/28/15 0930   05/28/15 0600  cefOXitin (MEFOXIN) 2 g in dextrose 5 % 50 mL IVPB  Status:  Discontinued    Comments:  Pharmacy may adjust dosing strength, interval, or rate of medication as needed for optimal therapy for the patient Send with patient on call to the OR.  Anesthesia to complete antibiotic administration <70min prior to incision per [facility].   2 g 100 mL/hr over 30 Minutes Intravenous On call to O.R. 05/27/15 1320 05/27/15 1328       Assessment/Plan Pancreatic mass - adenocarcinoma -Unresectable SBO secondary to above POD #7 s/p ex  lap, ileocolonic bypass  -Ileus resolved. Did have a BM, having flatus.  -Encouraged ambulation and IS.  -Pain control, antiemetics -Continue G-tube clamp, Suture has pulled out around G-tube, may need to replace it, monitor cellulitis around G-tube -Staples removed secondary to trapped seroma under the incision, cellulitic changes, and slough/necrosis at the base of the wound, no significant wound dehiscence,  WD dressing change TID for now, may qualify for a vac in a few days -Send cultures, Start vanc/zosyn Day #1 PCM  -Dietitian following, tolerating soft diet, on 1/2 dose TPN, could wean to off Multiple medical problems -- per primary service  Disp:  -D/c on hold, maybe tomorrow, told her if her dog's  vaccines are in check she could come up.  Patient has been very unwilling to discuss cancer and would prefer not to talk about it at all. Oncology's Dr. Ammie Dalton following and arranging OP follow up.    LOS: 13 days    Ashley Hurley 06/04/2015, 11:11 AM Pager: [phone]

## 2015-06-04 NOTE — Progress Notes (Signed)
Brief discussion with patient and family regarding diagnosis and treatment options. An outpatient consult has been scheduled for 06/19/2015. Please call Oncology sooner as needed.

## 2015-06-04 NOTE — Progress Notes (Signed)
Notified Megan PA of delay in antibiotic admin d/t blood administration and no other IV access.  PA is fine with this and states to start antibiotics as soon as blood is done infusing.  Will carry out orders. Graceann Congress

## 2015-06-04 NOTE — Care Management Note (Signed)
Case Management Note  Patient Details  Name: Ashley Hurley MRN: [id number] Date of Birth: 1951/04/06  Subjective/Objective:                    Action/Plan:   Expected Discharge Date:      06-04-15            Expected Discharge Plan:  Home/Self Care  In-House Referral:     Discharge planning Services     Post Acute Care Choice:    Choice offered to:     DME Arranged:    DME Agency:     HH Arranged:    [place] Agency:     Status of Service:  Completed, signed off  Medicare Important Message Given:    Date Medicare IM Given:    Medicare IM give by:    Date Additional Medicare IM Given:    Additional Medicare Important Message give by:     If discussed at [place] of Stay Meetings, dates discussed:  06-04-15  Additional Comments:  Marilu Favre, RN 06/04/2015, 11:11 AM

## 2015-06-04 NOTE — Progress Notes (Signed)
ANTIBIOTIC CONSULT NOTE - INITIAL  Pharmacy Consult for Vancomycin and Zosyn Indication: Wound infection  Allergies  Allergen Reactions  . Aspirin     Patient Measurements: Height: 5\' 6"  (167.6 cm) Weight: 164 lb 3.2 oz (74.481 kg) IBW/kg (Calculated) : 59.3  Vital Signs: Temp: 98.6 F (37 C) (06/30 1137) Temp Source: Oral (06/30 1137) BP: 94/51 mmHg (06/30 1137) Pulse Rate: 95 (06/30 1137) Intake/Output from previous day: 06/29 0701 - 06/30 0700 In: 240 [P.O.:240] Out: 1 [Urine:1] Intake/Output from this shift: Total I/O In: 335 [Blood:335] Out: -   Labs:  Recent Labs  06/02/15 0640 06/03/15 0440 06/04/15 0510 06/04/15 0909  WBC  --  11.2* 9.1 10.2  HGB  --  7.4* 6.4* 7.0*  PLT  --  247 199 213  CREATININE 0.98 1.11* 1.11*  --    Estimated Creatinine Clearance: 52.2 mL/min (by C-G formula based on Cr of 1.11). No results for input(s): VANCOTROUGH, VANCOPEAK, VANCORANDOM, GENTTROUGH, GENTPEAK, GENTRANDOM, TOBRATROUGH, TOBRAPEAK, TOBRARND, AMIKACINPEAK, AMIKACINTROU, AMIKACIN in the last 72 hours.   Microbiology: Recent Results (from the past 720 hour(s))  Culture, Urine     Status: None   Collection Time: 05/18/15  8:38 AM  Result Value Ref Range Status   Colony Count 5,000 COLONIES/ML  Final   Organism ID, Bacteria Insignificant Growth  Final  Blood culture (routine x 2)     Status: None   Collection Time: 05/22/15 11:43 AM  Result Value Ref Range Status   Specimen Description BLOOD HAND RIGHT  Final   Special Requests BOTTLES DRAWN AEROBIC ONLY 3CC  Final   Culture NO GROWTH 5 DAYS  Final   Report Status 05/27/2015 FINAL  Final  Blood culture (routine x 2)     Status: None   Collection Time: 05/22/15 12:42 PM  Result Value Ref Range Status   Specimen Description BLOOD ARM LEFT  Final   Special Requests BOTTLES DRAWN AEROBIC AND ANAEROBIC 5CC  Final   Culture NO GROWTH 5 DAYS  Final   Report Status 05/27/2015 FINAL  Final  Urine culture     Status:  None   Collection Time: 05/22/15  1:21 PM  Result Value Ref Range Status   Specimen Description URINE, CLEAN CATCH  Final   Special Requests NONE  Final   Culture   Final    MULTIPLE SPECIES PRESENT, SUGGEST RECOLLECTION IF CLINICALLY INDICATED   Report Status 05/23/2015 FINAL  Final  Blood culture (routine x 2)     Status: None   Collection Time: 05/22/15  5:05 PM  Result Value Ref Range Status   Specimen Description BLOOD LEFT ANTECUBITAL  Final   Special Requests BOTTLES DRAWN AEROBIC ONLY  8 CC  Final   Culture NO GROWTH 5 DAYS  Final   Report Status 05/27/2015 FINAL  Final  Surgical pcr screen     Status: Abnormal   Collection Time: 05/28/15  7:39 AM  Result Value Ref Range Status   MRSA, PCR NEGATIVE NEGATIVE Final   Staphylococcus aureus POSITIVE (A) NEGATIVE Final    Comment:        The Xpert SA Assay (FDA approved for NASAL specimens in patients over 72 years of age), is one component of a comprehensive surveillance program.  Test performance has been validated by [facility] for patients greater than or equal to 8 year old. It is not intended to diagnose infection nor to guide or monitor treatment.     Medical History: Past Medical History  Diagnosis Date  . Cancer     breast  . Hypertension   . Hyperlipidemia   . Allergy   . Chocolate cyst of ovary   . Breast cancer, right breast Jan 2001    [id number] radiation and chemo [place]   Assessment: 64 yo F presents on 6/17 with vomiting. Found to have a pancreatic mass, liver mass, and SBO. Failed conservative management for SBO so taken to OR on 6/23. Has continued to have complicated course and today has pain/pressure on lower part of her incision. Surgery also reported foul smell in the room. Pharmacy to dose vancomycin and zosyn for wound infection. Pt is afebrile, WBC wnl. SCr 1.11, CrCl ~14ml/min.  Goal of Therapy:  Vancomycin trough level 15-20 mcg/ml  Resolution of infection  Plan:  Give Zosyn  3.375g IV (30 min infusion) x 1, then start 3.375g IV (4hr infusion) Q8 Give vancomycin 1500mg  IV x 1, then start 750mg  IV Q12 Monitor clinical picture, renal function, VT prn  Kimarion Chery J 06/04/2015,12:21 PM

## 2015-06-04 NOTE — Progress Notes (Addendum)
[facility] NOTE  Pharmacy Consult for TPN Indication: SBO, expected prolonged ileus  Allergies  Allergen Reactions  . Aspirin    Patient Measurements: Height: 5' 6" (167.6 cm) Weight: 164 lb 3.2 oz (74.481 kg) IBW/kg (Calculated) : 59.3  Vital Signs: Temp: 98.4 F (36.9 C) (06/30 0628) Temp Source: Oral (06/30 0628) BP: 103/59 mmHg (06/30 0628) Pulse Rate: 97 (06/30 0628) Intake/Output from previous day: 06/29 0701 - 06/30 0700 In: 240 [P.O.:240] Out: 1 [Urine:1] Intake/Output from this shift:    Labs:  Recent Labs  06/03/15 0440 06/04/15 0510  WBC 11.2* 9.1  HGB 7.4* 6.4*  HCT 21.1* 18.3*  PLT 247 199     Recent Labs  06/02/15 0640 06/03/15 0440 06/04/15 0510  NA 134* 137 133*  K 4.2 3.4* 3.2*  CL 108 104 104  CO2 _0 GLUCOSE 205* 163* 178*  BUN 27* 31* 33*  CREATININE 0.98 1.11* 1.11*  CALCIUM 7.5* 7.4* 7.4*  MG 2.0 1.7 1.7  PHOS 3.8  --  3.3  PROT  --  4.0* 4.1*  ALBUMIN  --  1.2* 1.2*  AST  --  27 25  ALT  --  44 40  ALKPHOS  --  210* 162*  BILITOT  --  2.3* 3.3*   Estimated Creatinine Clearance: 52.2 mL/min (by C-G formula based on Cr of 1.11).    Recent Labs  06/04/15 0022 06/04/15 0337 06/04/15 0746  GLUCAP 180* 158* 138*   Insulin Requirements in the past 24 hours:  12 units sensitive SSI + 10 units regular insulin in TPN bag  Assessment: 2 yof with presenting 05/22/15 with worsening N/V and abdominal pain. Duration of symptoms 2 months and has lost 11-12 lb in last month. Nutrition notes patient with moderate depletion in body fat, depletions in muscle mass. Patient found to have SBO likely due to pelvic mass - anticipated prolonged ileus post-op and patient has been NPO x ~5 days inpatient. Pharmacy consulted to initiate TPN for SBO and anticipated prolonged ileus post-op. Expected d/c date 6/30 per case management note  Surgeries/Procedures: 6/23: ex-lap w/ SB to L colon bypass - palliative gastrostomy  tube  GI: SBO likely due to pelvic mass - anticipated prolonged ileus post-op. Prealbumin 9 on admit >> 6.2. Did not tolerate sips of clears on 6/21. Throwing up bile and having BMs on 6/22. Failed non-operative management - NGT clamped on 6/21, replaced 6/22 with continued vomiting. 6/23: Ex-lap w/ SB to L colon bypass (palliative) - gastrostomy tube placement. 1759m of output from drain in 24h (increased). PPI to po. Transferred out of ICU on 6/26. Ileus resolving. Sips of clears started 6/27, full liquids on 6/28 >> 2 small BMs - Gtube clamped on 6/29 and advanced to soft diet. Pt seems to be tolerating PO intake but meal completion is about 25-50%.  Endo: No DM hx, CBGs controlled prior to start of TPN and OR. Tapering stress dose steroids to off in next few days (decreased hydrocortisone to 22mIV q24). CBGs controlled 130-190s in last 24h  Lytes: Na at 133, K 3.2; goal >/= 4 with ileus (4067myesterday), Mag 1.7 with no replacement yesterday (goal >/=2 with ileus), corCa 9.7  Renal: SCr up slightly 1.11, CrCl~50-55, I/Os appear innaccurate. lasix 20 IV x 1 dose again today  Cards: HTN, HLD. Hypotension/tachycardia post-OR. Off pressors - BP wnl, HR ok  Hepatobil: LFTs normalized. Alk phos decreased to 162. TBili increased to 3.3 (pt does not appear  jaundiced). TG 316 on 6/27 - will monitor  Neuro: Anxiety. 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 Outpatient appointment scheduled in mid July. Hx breast cancer s/p mastectomy. Chronic anemia. Hgb 7.4 stable, plts wnl. No overt bleeding noted  ID: Afebrile, wbc wnl, no antibiotics 6/17 BCx4>>ngf 6/17 UC>>ngf  Best Practices: enox40, PPI po TPN Access: PICC placed 05/27/15 TPN start date: 05/27/15>>  Current Nutrition:  Clinimix E 5/15 _0  ml/h provides 48g protein and 682kcal Ensure enlive PO BID - None given yet (20g of protein and 350kcal per  supplement) Soft diet started yesterday (Eating ~25-50% of meals)  Nutritional Goals:  1750-1950 kCal, 90-100 grams of protein per day (per Nutrition assessment)  Plan: Continue Clinimix E 5/15 at 23m/hr  - provides 48g protein and 682kcal  Multivitamin and trace elements daily in TPN Continue regular insulin 10 units in TPN bag + continue sensitive SSI and monitor CBGs Give Mg 2g IV x 1 Kdur 310m x 2 ordered per MD F/u toleration of soft diet (Will most likely be able to stop TPN tomorrow) F/U Bmet tomorrow  NaElenor QuinonesPharmD Clinical Pharmacist Resident Pager [phone]/30/2016 8:04 AM

## 2015-06-05 ENCOUNTER — Inpatient Hospital Stay (HOSPITAL_COMMUNITY): Payer: BC Managed Care – PPO

## 2015-06-05 ENCOUNTER — Encounter (HOSPITAL_COMMUNITY): Payer: BC Managed Care – PPO

## 2015-06-05 DIAGNOSIS — M7989 Other specified soft tissue disorders: Secondary | ICD-10-CM

## 2015-06-05 DIAGNOSIS — T792XXD Traumatic secondary and recurrent hemorrhage and seroma, subsequent encounter: Secondary | ICD-10-CM

## 2015-06-05 DIAGNOSIS — T814XXD Infection following a procedure, subsequent encounter: Secondary | ICD-10-CM

## 2015-06-05 DIAGNOSIS — T8131XD Disruption of external operation (surgical) wound, not elsewhere classified, subsequent encounter: Secondary | ICD-10-CM

## 2015-06-05 LAB — CBC
HCT: 22.6 % — ABNORMAL LOW (ref 36.0–46.0)
HEMOGLOBIN: 8 g/dL — AB (ref 12.0–15.0)
MCH: 30.3 pg (ref 26.0–34.0)
MCHC: 35.4 g/dL (ref 30.0–36.0)
MCV: 85.6 fL (ref 78.0–100.0)
Platelets: 229 10*3/uL (ref 150–400)
RBC: 2.64 MIL/uL — ABNORMAL LOW (ref 3.87–5.11)
RDW: 17.2 % — ABNORMAL HIGH (ref 11.5–15.5)
WBC: 9.2 10*3/uL (ref 4.0–10.5)

## 2015-06-05 LAB — BASIC METABOLIC PANEL
Anion gap: 10 (ref 5–15)
BUN: 27 mg/dL — ABNORMAL HIGH (ref 6–20)
CHLORIDE: 101 mmol/L (ref 101–111)
CO2: 22 mmol/L (ref 22–32)
Calcium: 7.2 mg/dL — ABNORMAL LOW (ref 8.9–10.3)
Creatinine, Ser: 1.15 mg/dL — ABNORMAL HIGH (ref 0.44–1.00)
GFR calc Af Amer: 57 mL/min — ABNORMAL LOW (ref >60)
GFR, EST NON AFRICAN AMERICAN: 49 mL/min — AB (ref >60)
Glucose, Bld: 145 mg/dL — ABNORMAL HIGH (ref 65–99)
Potassium: 3.6 mmol/L (ref 3.5–5.1)
Sodium: 133 mmol/L — ABNORMAL LOW (ref 135–145)

## 2015-06-05 LAB — TYPE AND SCREEN
ABO/RH(D): O NEG
Antibody Screen: NEGATIVE
Unit division: 0

## 2015-06-05 LAB — GLUCOSE, CAPILLARY
Glucose-Capillary: 148 mg/dL — ABNORMAL HIGH (ref 65–99)
Glucose-Capillary: 134 mg/dL — ABNORMAL HIGH (ref 65–99)
Glucose-Capillary: 145 mg/dL — ABNORMAL HIGH (ref 65–99)

## 2015-06-05 MED ORDER — FUROSEMIDE 10 MG/ML IJ SOLN
40.0000 mg | Freq: Every day | INTRAMUSCULAR | Status: DC
Start: 2015-06-06 — End: 2015-06-07
  Administered 2015-06-06 – 2015-06-07 (×2): 40 mg via INTRAVENOUS
  Filled 2015-06-05 (×2): qty 4

## 2015-06-05 NOTE — Care Management Note (Signed)
Case Management Note  Patient Details  Name: Ashley Hurley MRN: [id number] Date of Birth: Apr 13, 1951  Subjective/Objective:                    Action/Plan: Edwina from [place] will call [name] directly as requested.   Expected Discharge Date:                  Expected Discharge Plan:  [name]  In-House Referral:     Discharge planning Services  CM Consult  Post Acute Care Choice:  Home Health, Durable Medical Equipment Choice offered to:  Patient, Spouse  DME Arranged:  3-N-1, [place] rolling DME Agency:  [place]:  RN, PT [facility] Agency:  [place]  Status of Service:  In process, will continue to follow  Medicare Important Message Given:    Date Medicare IM Given:    Medicare IM give by:    Date Additional Medicare IM Given:    Additional Medicare Important Message give by:     If discussed at [place] of Stay Meetings, dates discussed:    Additional Comments:  Marilu Favre, RN 06/05/2015, 2:35 PM

## 2015-06-05 NOTE — Progress Notes (Signed)
[facility] Progress Note  8 Days Post-Op  Subjective: Pt doing okay, says appetite improving.  Says dressing changes are going okay.  No N/V.  Still having drainage from around g-tube.  Wound still has a foul smell.  Had a BM on 06/03/15.  Having flatus.  Still anxious to get home.  Spouse and friend at bedside.  Objective: Vital signs in last 24 hours: Temp:  [98.1 F (36.7 C)-99.3 F (37.4 C)] 99.3 F (37.4 C) (07/01 0701) Pulse Rate:  [94-107] 94 (07/01 0701) Resp:  [16-18] 17 (07/01 0701) BP: (92-97)/(59-61) 92/61 mmHg (07/01 0701) SpO2:  [94 %-98 %] 94 % (07/01 0701) Weight:  [79.107 kg (174 lb 6.4 oz)] 79.107 kg (174 lb 6.4 oz) (07/01 0701) Last BM Date: 06/03/15  Intake/Output from previous day: 06/30 0701 - 07/01 0700 In: 575 [P.O.:240; Blood:335] Out: -  Intake/Output this shift: Total I/O In: 220 [P.O.:220] Out: -   PE: Gen:  Alert, NAD, pleasant Abd: Soft, moderate tenderness, seropurulent drainage, liquified slough coating the base of the wound, 1-2 cm of fascial dehiscence at superior aspect of the incisional wound, +BS, no HSM, midline wound with yellow slough and foul odor, no significant wound dehiscence. G-tube in place with greenish enteric contents leaking around the g-tube, (placed an extra suture yesterday to help lessen the leaking however now some of the leaking contents are collecting under the skin.   06/05/15   Lab Results:   Recent Labs  06/04/15 1535 06/05/15 0417  WBC 10.5 9.2  HGB 7.9* 8.0*  HCT 22.3* 22.6*  PLT 208 229   BMET  Recent Labs  06/04/15 0510 06/05/15 0417  NA 133* 133*  K 3.2* 3.6  CL 104 101  CO2 25 22  GLUCOSE 178* 145*  BUN 33* 27*  CREATININE 1.11* 1.15*  CALCIUM 7.4* 7.2*   PT/INR No results for input(s): LABPROT, INR in the last 72 hours. CMP     Component Value Date/Time   NA 133* 06/05/2015 0417   K 3.6 06/05/2015 0417   CL 101 06/05/2015 0417   CO2 22 06/05/2015 0417   GLUCOSE 145*  06/05/2015 0417   BUN 27* 06/05/2015 0417   CREATININE 1.15* 06/05/2015 0417   CALCIUM 7.2* 06/05/2015 0417   PROT 4.1* 06/04/2015 0510   ALBUMIN 1.2* 06/04/2015 0510   AST 25 06/04/2015 0510   ALT 40 06/04/2015 0510   ALKPHOS 162* 06/04/2015 0510   BILITOT 3.3* 06/04/2015 0510   GFRNONAA 49* 06/05/2015 0417   GFRAA 57* 06/05/2015 0417   Lipase     Component Value Date/Time   LIPASE 57* 05/22/2015 1153       Studies/Results: No results found.  Anti-infectives: Anti-infectives    Start     Dose/Rate Route Frequency Ordered Stop   06/05/15 0200  vancomycin (VANCOCIN) IVPB 750 mg/150 ml premix     750 mg 150 mL/hr over 60 Minutes Intravenous Every 12 hours 06/04/15 1230     06/04/15 1800  piperacillin-tazobactam (ZOSYN) IVPB 3.375 g     3.375 g 12.5 mL/hr over 240 Minutes Intravenous Every 8 hours 06/04/15 1230     06/04/15 1300  vancomycin (VANCOCIN) 1,500 mg in sodium chloride 0.9 % 500 mL IVPB     1,500 mg 250 mL/hr over 120 Minutes Intravenous  Once 06/04/15 1227 06/04/15 1823   06/04/15 1230  piperacillin-tazobactam (ZOSYN) IVPB 3.375 g     3.375 g 100 mL/hr over 30 Minutes Intravenous  Once 06/04/15 1226 06/04/15  1505   05/28/15 0930  cefOXitin (MEFOXIN) 2 g in dextrose 5 % 50 mL IVPB    Comments:  Pharmacy may adjust dosing strength, interval, or rate of medication as needed for optimal therapy for the patient Send with patient on call to the OR.  Anesthesia to complete antibiotic administration <68min prior to incision per [facility].   2 g 100 mL/hr over 30 Minutes Intravenous On call to O.R. 05/28/15 0919 05/28/15 0930   05/28/15 0600  cefOXitin (MEFOXIN) 2 g in dextrose 5 % 50 mL IVPB  Status:  Discontinued    Comments:  Pharmacy may adjust dosing strength, interval, or rate of medication as needed for optimal therapy for the patient Send with patient on call to the OR.  Anesthesia to complete antibiotic administration <55min prior to incision per [facility].   2 g 100 mL/hr over 30 Minutes Intravenous On call to O.R. 05/27/15 1320 05/27/15 1328       Assessment/Plan Pancreatic mass - adenocarcinoma -Unresectable SBO secondary to above POD #8 s/p ex lap, ileocolonic bypass  -Ileus resolved. -Encouraged ambulation and IS.  -Pain control, antiemetics -Continue G-tube clamp, g-tube leaking - pending WOC consult, will likely need fistula/ostomy pouch to manage and skin barrier. -Staples removed 6/30 secondary to trapped infected seroma under the incision, cellulitic changes, and slough/necrosis at the base of the wound, WD dressing change TID for now -VAC at some point, but not clean enough yet -Send cultures, pending, Start vanc/zosyn Day #2 Infected seroma Wound dehiscence -abdominal binder for comfort when ambulating Leaking G-tube PCM  -Dietitian following, tolerating soft diet, d/c TF now that taking in more PO, encourage supplements -She is taking in some outside food (smoothies/drinks) Multiple medical problems -- per primary service  Disp:  -D/c on hold wound infections and problematic leaking G-tube, WOC pending, d/c few days from now when wound is cleaner.  Patient has been very unwilling to discuss cancer and would prefer not to talk about it at all.  Strong denial and avoidance behavior noted.  Oncology's Dr. Ammie Dalton following and arranging OP follow up.  Spouse and friend very thankful for our care.    LOS: 14 days    Nat Christen 06/05/2015, 11:50 AM Pager: [phone]

## 2015-06-05 NOTE — Progress Notes (Signed)
Patient's G-tube is leaking, requiring rolls of kerlex and 4x4 gauze and still needs to be changed every couple of hours to the point where when family calls RN to say it's leaking they are holding towels around the area.

## 2015-06-05 NOTE — Progress Notes (Signed)
PROGRESS NOTE    Ashley Hurley JIR:[id number] DOB: 1950-12-06 DOA: 05/22/2015 PCP: Lottie Dawson, MD  HPI/Brief narrative 64 y.o. F with hx breast CA s/p mastectomy, admitted 6/17 with N/V and abd pain. Found to have pancreatic mass, liver mass, SBO. Failed conservative management for SBO therefor taken to OR 6/23 for ex lap. Following surgery, she remained hypotensive; therefore, PCCM was consulted. Patient transferred [facility] on 6/27.   Assessment/Plan:  Active Problems: SBO secondary to pancreatic mass/adenocarcinoma - s/p ex lap 6/23, with ileocolonic bypass (palliative), gastrostomy tube placement (Dr. Marlou Starks), post op day #7 - Pancreatic mass - unresectable per Dr. Marlou Starks. - ileus resolved. - G-tube clamped 6/29, staples removed 6/30 secondary to trapped infected seroma. Now complicated by large subcutaneous infected seroma-see below  - Management per general surgery-follow-up appreciated - Taper TPN  Large subcutaneous infected Seroma/wound dehiscence - At site of exploratory laparotomy - Wound now open. As per surgery dressing changes and probably can switch to Vac in a couple of days. - Started IV vancomycin and Zosyn. - As per surgery, poor wound healing around G-tube and incision, superficial fascial necrosis but no obvious dehiscence or exposed bowel, some order from necrotic rectus sheath - WOC input appreciated. - Follow culture results.  Severe PCM - in the setting of the acute on chronic illness - slowly advancing diet as noted above  - Dietitian following  Newly discovered pancreatobiliary adenocarcinoma  - biopsy proven 6/20 (liver bx via IR) - biopsy results positive for adenocarcinoma (pancreatobiliary) with liver mets - Dr. Gearldine Shown brief note appreciated and discussed with him. He has arranged a follow-up appointment with him on 06/19/15.    Post op hypotension secondary to hypovolemia and adrenal insufficiency  - continue to taper down stress  dose steroids - Improved  Anemia of chronic disease, malignancy - no signs of active bleeding - Stable. Transfuse if hemoglobin <7 g per DL. - Transfused 1 unit of PRBC for hemoglobin of 7 on 6/30. Hemoglobin improved to 7.9.  Acute hypoxic respiratory failure - post op atelectasis and effusion at the right lung base, now resolved   Right hydroureteronephrosis  - secondary to a soft tissue mass in the right lower pelvis - Urologist recommend conservative management of asymptomatic hydronephrosis at this time.  - since Cr trending down, no stenting needed at this time  - Creatinine has remained normal.  Acute renal failure - Secondary to right hydroureteronephrosis and prerenal etiology from dehydration in the setting of SBO - IV fluids provided, creatinine remains WNL  - IVF have been discontinued 6/27 due to volume overload  - Monitor creatinine periodically  Volume overload in the setting of severe hypoalbuminemia and post op hypotension requiring IVF - at baseline ~ 130 lbs but now up to 170's lbs - Will gradually get better with improving nutritional status - No significant diuresis with IV Lasix given on 6/28  Hypokalemia - Replace as needed and follow  Leaking G-tube - WOC consultation and management per surgery  Right lower extremity swelling - Lower extremity venous Doppler negative for DVT. Likely from asymmetric edema related to IV fluid resuscitation and hypoalbuminemia. - Increase IV Lasix.      DVT prophylaxis - Lovenox SQ Code Status: Full.  Family Communication: Discussed with patient's significant other and a friend at bedside on 7/1.  Disposition Plan: Home and medically stable and cleared by surgery.     SIGNIFICANT EVENTS: 6/17 - admit. 6/20 - liver biopsy (pathology positive for adenocarcinoma - pancreatobiliary).  6/23 - ex lap with SB to left colon bypass (palliative), gastrostomy tube placement. 6/26 - transferred from ICU to medical  floor  6/27 - Pt transferred from [facility] to [facility] care; on TPN 6/28 - Tolerating clear liquids, has had BM 6/29-G-tube clamped and starting soft diet  Consultants:  Surgery  Oncology  Procedures: 6/20 - liver biopsy (pathology positive for adenocarcinoma - pancreatobiliary). 6/23 - ex lap with SB to left colon bypass (palliative), gastrostomy tube placement. - RUE PICC 6/22 >>>  Antibiotics:  IV vancomycin 6/30 >  IV Zosyn 6/30 >  Subjective: Patient frustrated being in the hospital and wants to go home. Hard time understanding ongoing complex medical/surgical problems. Patient's friend/significant other explaining to patient. Feels better-less drainage from incision site. Still leaking from around G-tube this morning. No abdominal pain. Tolerating diet.  Objective: Filed Vitals:   06/04/15 1422 06/04/15 2248 06/05/15 0701 06/05/15 1330  BP: 97/59 95/59 92/61  90/55  Pulse: 107 94 94 93  Temp: 98.1 F (36.7 C) 99 F (37.2 C) 99.3 F (37.4 C) 98.3 F (36.8 C)  TempSrc: Oral Oral Oral Oral  Resp: 18 16 17 18   Height:      Weight:   79.107 kg (174 lb 6.4 oz)   SpO2: 98% 95% 94% 96%    Intake/Output Summary (Last 24 hours) at 06/05/15 1632 Last data filed at 06/05/15 1424  Gross per 24 hour  Intake    220 ml  Output     20 ml  Net    200 ml   Filed Weights   06/02/15 0553 06/04/15 0628 06/05/15 0701  Weight: 77.3 kg (170 lb 6.7 oz) 74.481 kg (164 lb 3.2 oz) 79.107 kg (174 lb 6.4 oz)     Exam:  General exam: Pleasant middle-aged female in sitting up comfortably in bed this morning. Marland Kitchen Respiratory system: clear to auscultation. No increased work of breathing. Cardiovascular system: S1 & S2 heard, RRR. No JVD, murmurs, gallops, clicks. Trace bilateral leg edema, right >left. Not on telemetry. Gastrointestinal system: Abdomen is nondistended, soft and mild appropriate tenderness. Dressing over surgical site and around G tube clean and dry. Foul odor +. Normal bowel  sounds heard. Central nervous system: Alert and oriented. No focal neurological deficits. Extremities: Symmetric 5 x 5 power.   Data Reviewed: Basic Metabolic Panel:  Recent Labs Lab 05/30/15 0441 05/31/15 0428 06/01/15 5916 06/02/15 0640 06/03/15 0440 06/04/15 0510 06/05/15 0417  NA 135 138 136 134* 137 133* 133*  K 3.8 4.2 4.3 4.2 3.4* 3.2* 3.6  CL 108 112* 111 108 104 104 101  CO2 20* 22 21* 22 24 25 22   GLUCOSE 187* 201* 196* 205* 163* 178* 145*  BUN 15 16 19  27* 31* 33* 27*  CREATININE 0.95 0.89 0.92 0.98 1.11* 1.11* 1.15*  CALCIUM 7.3* 7.4* 7.9* 7.5* 7.4* 7.4* 7.2*  MG 1.7 2.2 1.9 2.0 1.7 1.7  --   PHOS 1.2* 2.9 3.6 3.8  --  3.3  --    Liver Function Tests:  Recent Labs Lab 05/31/15 0428 06/01/15 0628 06/03/15 0440 06/04/15 0510  AST 57* 43* 27 25  ALT 46 54 44 40  ALKPHOS 111 179* 210* 162*  BILITOT 1.9* 1.4* 2.3* 3.3*  PROT 4.0* 4.1* 4.0* 4.1*  ALBUMIN 1.2* 1.3* 1.2* 1.2*   No results for input(s): LIPASE, AMYLASE in the last 168 hours. No results for input(s): AMMONIA in the last 168 hours. CBC:  Recent Labs Lab 05/31/15 0428 06/01/15 3846 06/03/15  7846 06/04/15 0510 06/04/15 0909 06/04/15 1535 06/05/15 0417  WBC 8.8 7.5 11.2* 9.1 10.2 10.5 9.2  NEUTROABS 7.4 6.0  --   --   --   --   --   HGB 7.6* 7.9* 7.4* 6.4* 7.0* 7.9* 8.0*  HCT 22.2* 22.8* 21.1* 18.3* 19.8* 22.3* 22.6*  MCV 91.7 90.5 89.4 88.8 90.0 85.4 85.6  PLT 190 212 247 199 213 208 229   Cardiac Enzymes: No results for input(s): CKTOTAL, CKMB, CKMBINDEX, TROPONINI in the last 168 hours. BNP (last 3 results) No results for input(s): PROBNP in the last 8760 hours. CBG:  Recent Labs Lab 06/04/15 1939 06/04/15 2303 06/05/15 0333 06/05/15 0734 06/05/15 1155  GLUCAP 163* 137* 148* 134* 145*    Recent Results (from the past 240 hour(s))  Surgical pcr screen     Status: Abnormal   Collection Time: 05/28/15  7:39 AM  Result Value Ref Range Status   MRSA, PCR NEGATIVE NEGATIVE  Final   Staphylococcus aureus POSITIVE (A) NEGATIVE Final    Comment:        The Xpert SA Assay (FDA approved for NASAL specimens in patients over 59 years of age), is one component of a comprehensive surveillance program.  Test performance has been validated by [facility] for patients greater than or equal to 20 year old. It is not intended to diagnose infection nor to guide or monitor treatment.         Studies: No results found.      Scheduled Meds: . antiseptic oral rinse  7 mL Mouth Rinse q12n4p  . chlorhexidine  15 mL Mouth Rinse BID  . enoxaparin (LOVENOX) injection  40 mg Subcutaneous QHS  . escitalopram  20 mg Oral Daily  . feeding supplement (ENSURE ENLIVE)  237 mL Oral BID BM  . furosemide  20 mg Intravenous Daily  . hydrocortisone sodium succinate  25 mg Intravenous Daily  . pantoprazole  40 mg Oral Daily  . piperacillin-tazobactam (ZOSYN)  IV  3.375 g Intravenous Q8H  . vancomycin  750 mg Intravenous Q12H   Continuous Infusions: . Marland KitchenTPN (CLINIMIX-E) Adult 40 mL/hr at 06/04/15 1741    Active Problems:   Liver mass, left lobe   Pancreatic mass   SBO (small bowel obstruction)   Abdominal pain   Protein-calorie malnutrition, severe   Liver mass   Nausea and vomiting   Arterial hypotension   Atelectasis   Hydroureter, right   Pulmonary edema    Time spent: 25 minutes    Ravin Denardo, MD, FACP, FHM. [facility] Pager [phone]  If 7PM-7AM, please contact night-coverage [email or web address] Password [email or web address] 06/05/2015, 4:32 PM    LOS: 14 days

## 2015-06-05 NOTE — Progress Notes (Signed)
VASCULAR LAB PRELIMINARY  PRELIMINARY  PRELIMINARY  PRELIMINARY  Bilateral lower extremity venous duplex completed.    Preliminary report:  Bilateral:  No evidence of DVT, superficial thrombosis, or Baker's Cyst. Moderate interstitial fluid noted throughout the right lower extremity.   Ashley Hurley, RVS 06/05/2015, 10:27 AM

## 2015-06-05 NOTE — Consult Note (Signed)
WOC requested to address leakage around Gtube.  Pt has large bore gastrostomy tube in the LLQ. She has watery green drainage leaking currently and this has been saturating dressings.  Her skin is red around the tube and she has some thicker drainage at the tube sight that is green. She has some skin necrosis just at the stoma edges.  She has sutures in place.  I have placed urostomy pouch over the tube and routed the tube up through the top of the pouch. I have added a 2" ostomy barrier ring around the Gtube to protect the affected skin and aid in the seal of the pouch and to encourage the output to flow into the pouch.  Patients partner and two friends at the bedside.    I have discussed current care with ostomy pouch with the bedside nurse, she can monitor the pouch for need for draining and if the drainage is too great to manage with routine emptying I have left an adapter for the pouch to attach it to BSD bag.    WOC will follow along with you for support with gastrostomy needs. [place], [place]

## 2015-06-05 NOTE — Care Management Note (Signed)
Case Management Note  Patient Details  Name: Joane Postel MRN: [id number] Date of Birth: Sep 28, 1951  Subjective/Objective:                    Action/Plan:  Discussed home health with patient and [name] at bedside .  Confirmed face sheet information , provided a list of home health agencies .   They will discuss , they would like NCM to follow up this afternoon .   Patient may need home wound VAC in about a week . Application started and information given to [place] with [employer].   Magdalen Spatz RN BSN [phone]   Expected Discharge Date:                  Expected Discharge Plan:  [place]  In-House Referral:     Discharge planning Services  CM Consult  Post Acute Care Choice:  Home Health, Durable Medical Equipment Choice offered to:     DME Arranged:  3-N-1, Walker rolling DME Agency:  [place]:  RN, PT [facility] Agency:     Status of Service:  In process, will continue to follow  Medicare Important Message Given:    Date Medicare IM Given:    Medicare IM give by:    Date Additional Medicare IM Given:    Additional Medicare Important Message give by:     If discussed at [place] of Stay Meetings, dates discussed:    Additional Comments:  Marilu Favre, RN 06/05/2015, 12:49 PM

## 2015-06-05 NOTE — Progress Notes (Signed)
Physical Therapy Treatment Patient Details Name: Ashley Hurley MRN: [id number] DOB: 11/03/1951 Today's Date: Jun 23, 2015    History of Present Illness 64 y.o. F with hx breast CA s/p mastectomy, admitted 6/17 with N/V and abd pain. Found to have pancreatic mass, liver mass, LAD, SBO. Failed conservative management for SBO therefor taken to OR 6/23 for ex lap.    PT Comments    Patient tolerated ambulation better with binder.  Educated in in bed LE therex to do and for frequent short walks at home.  Caregivers in room and very involved.  Follow Up Recommendations  Home health PT;Supervision/Assistance - 24 hour     Equipment Recommendations  Rolling walker with 5" wheels;3in1 (PT)    Recommendations for Other Services       Precautions / Restrictions Precautions Precautions: Fall Required Braces or Orthoses: Other Brace/Splint Other Brace/Splint: abdominal binder    Mobility  Bed Mobility   Bed Mobility: Rolling;Sidelying to Sit;Sit to Sidelying Rolling: Supervision Sidelying to sit: Supervision;HOB elevated     Sit to sidelying: Min guard General bed mobility comments: cues for technique, to sidelying without rail and HOB flat  Transfers Overall transfer level: Needs assistance Equipment used: Rolling walker (2 wheeled) Transfers: Sit to/from Stand Sit to Stand: Min guard         General transfer comment: for safety, pt sit to stand holding walker  Ambulation/Gait Ambulation/Gait assistance: Supervision;Min guard Ambulation Distance (Feet): 300 Feet Assistive device: Rolling walker (2 wheeled) Gait Pattern/deviations: Narrow base of support;Trunk flexed;Step-through pattern;Decreased stride length     General Gait Details: keeps walker at a distance, difficulty navigating obstacles, cues and assist for safety   Stairs            Wheelchair Mobility    Modified Rankin (Stroke Patients Only)       Balance             Standing  balance-Leahy Scale: Fair                      Cognition Arousal/Alertness: Awake/alert Behavior During Therapy: WFL for tasks assessed/performed Overall Cognitive Status: Within Functional Limits for tasks assessed                      Exercises General Exercises - Lower Extremity Ankle Circles/Pumps: AROM;10 reps;Both;Supine Heel Slides: AAROM;AROM;Both;5 reps;Supine Straight Leg Raises: AROM;AAROM;Both;5 reps;Supine    General Comments        Pertinent Vitals/Pain Pain Score: 2  Pain Location: abdomen Pain Intervention(s): Monitored during session;Other (comment) (binder)    Home Living                      Prior Function            PT Goals (current goals can now be found in the care plan section) Progress towards PT goals: Progressing toward goals    Frequency  Min 3X/week    PT Plan Current plan remains appropriate    Co-evaluation             End of Session   Activity Tolerance: Patient tolerated treatment well Patient left: in bed;with call bell/phone within reach     Time: 6122-4497 PT Time Calculation (min) (ACUTE ONLY): 14 min  Charges:  $Gait Training: 8-22 mins                    G Codes:      Ashley Hurley,Ashley Hurley 2015/06/23, 5:03  PM  [place], [place] 06/05/2015

## 2015-06-06 LAB — CBC
HCT: 21.3 % — ABNORMAL LOW (ref 36.0–46.0)
Hemoglobin: 7.7 g/dL — ABNORMAL LOW (ref 12.0–15.0)
MCH: 31.6 pg (ref 26.0–34.0)
MCHC: 36.2 g/dL — ABNORMAL HIGH (ref 30.0–36.0)
MCV: 87.3 fL (ref 78.0–100.0)
Platelets: 203 10*3/uL (ref 150–400)
RBC: 2.44 MIL/uL — ABNORMAL LOW (ref 3.87–5.11)
RDW: 16.9 % — ABNORMAL HIGH (ref 11.5–15.5)
WBC: 7.6 10*3/uL (ref 4.0–10.5)

## 2015-06-06 LAB — BASIC METABOLIC PANEL
Anion gap: 8 (ref 5–15)
BUN: 21 mg/dL — ABNORMAL HIGH (ref 6–20)
CALCIUM: 7.2 mg/dL — AB (ref 8.9–10.3)
CO2: 24 mmol/L (ref 22–32)
Chloride: 101 mmol/L (ref 101–111)
Creatinine, Ser: 1.07 mg/dL — ABNORMAL HIGH (ref 0.44–1.00)
GFR calc non Af Amer: 53 mL/min — ABNORMAL LOW (ref >60)
GLUCOSE: 137 mg/dL — AB (ref 65–99)
Potassium: 3.3 mmol/L — ABNORMAL LOW (ref 3.5–5.1)
SODIUM: 133 mmol/L — AB (ref 135–145)

## 2015-06-06 MED ORDER — POTASSIUM CHLORIDE CRYS ER 20 MEQ PO TBCR
40.0000 meq | EXTENDED_RELEASE_TABLET | Freq: Once | ORAL | Status: AC
  Administered 2015-06-06: 40 meq via ORAL
  Filled 2015-06-06: qty 2

## 2015-06-06 MED ORDER — POTASSIUM CHLORIDE CRYS ER 10 MEQ PO TBCR
EXTENDED_RELEASE_TABLET | ORAL | Status: AC
  Filled 2015-06-06: qty 1

## 2015-06-06 MED ORDER — MEGESTROL ACETATE 40 MG/ML PO SUSP
200.0000 mg | Freq: Two times a day (BID) | ORAL | Status: DC
  Administered 2015-06-06 – 2015-06-08 (×5): 200 mg via ORAL
  Filled 2015-06-06 (×8): qty 5

## 2015-06-06 MED ORDER — PIPERACILLIN-TAZOBACTAM 3.375 G IVPB
3.3750 g | Freq: Three times a day (TID) | INTRAVENOUS | Status: DC
  Administered 2015-06-06 – 2015-06-07 (×2): 3.375 g via INTRAVENOUS
  Filled 2015-06-06 (×4): qty 50

## 2015-06-06 NOTE — Progress Notes (Signed)
Applied Montgomery straps (fitted around left sided urostomy bag) to preserve skin with frequent dressing changes.  Applied skin prep, Montgomery straps and did wet to dry dressing change with sterile saline and kerlix.

## 2015-06-06 NOTE — Progress Notes (Addendum)
[facility] Progress Note  9 Days Post-Op  Subjective: Wound doing better.  Tolerating soft diet.  Still not eating a significant amount per family/friends.    Objective: Vital signs in last 24 hours: Temp:  [98.3 F (36.8 C)-98.7 F (37.1 C)] 98.3 F (36.8 C) (07/02 0548) Pulse Rate:  [93-97] 97 (07/02 0548) Resp:  [16-18] 16 (07/02 0548) BP: (90)/(55-58) 90/58 mmHg (07/02 0548) SpO2:  [96 %-97 %] 97 % (07/02 0548) Weight:  [78.608 kg (173 lb 4.8 oz)] 78.608 kg (173 lb 4.8 oz) (07/02 0548) Last BM Date: 06/03/15  Intake/Output from previous day: 07/01 0701 - 07/02 0700 In: 1647 [P.O.:937; I.V.:10; IV Piggyback:700] Out: 90 [Drains:90] Intake/Output this shift: Total I/O In: -  Out: 450 [Urine:450]  PE: Gen:  Alert, NAD, pleasant Card:  RRR Pulm:  CTA Abd: Soft, ND, approp tender.  Wound with some necrotic debris at the base.  No cellulitis.  Dressing with minimal drainage on it.  Still has pretty foul odor.  G tube pouched with no evidence of leakage from pouch.   Ext:  No erythema, tr edema.     Lab Results:   Recent Labs  06/05/15 0417 06/06/15 0425  WBC 9.2 7.6  HGB 8.0* 7.7*  HCT 22.6* 21.3*  PLT 229 203   BMET  Recent Labs  06/05/15 0417 06/06/15 0425  NA 133* 133*  K 3.6 3.3*  CL 101 101  CO2 22 24  GLUCOSE 145* 137*  BUN 27* 21*  CREATININE 1.15* 1.07*  CALCIUM 7.2* 7.2*   PT/INR No results for input(s): LABPROT, INR in the last 72 hours. CMP     Component Value Date/Time   NA 133* 06/06/2015 0425   K 3.3* 06/06/2015 0425   CL 101 06/06/2015 0425   CO2 24 06/06/2015 0425   GLUCOSE 137* 06/06/2015 0425   BUN 21* 06/06/2015 0425   CREATININE 1.07* 06/06/2015 0425   CALCIUM 7.2* 06/06/2015 0425   PROT 4.1* 06/04/2015 0510   ALBUMIN 1.2* 06/04/2015 0510   AST 25 06/04/2015 0510   ALT 40 06/04/2015 0510   ALKPHOS 162* 06/04/2015 0510   BILITOT 3.3* 06/04/2015 0510   GFRNONAA 53* 06/06/2015 0425   GFRAA >60 06/06/2015  0425   Lipase     Component Value Date/Time   LIPASE 57* 05/22/2015 1153       Studies/Results: No results found.  Anti-infectives: Anti-infectives    Start     Dose/Rate Route Frequency Ordered Stop   06/05/15 0200  vancomycin (VANCOCIN) IVPB 750 mg/150 ml premix     750 mg 150 mL/hr over 60 Minutes Intravenous Every 12 hours 06/04/15 1230     06/04/15 1800  piperacillin-tazobactam (ZOSYN) IVPB 3.375 g     3.375 g 12.5 mL/hr over 240 Minutes Intravenous Every 8 hours 06/04/15 1230     06/04/15 1300  vancomycin (VANCOCIN) 1,500 mg in sodium chloride 0.9 % 500 mL IVPB     1,500 mg 250 mL/hr over 120 Minutes Intravenous  Once 06/04/15 1227 06/04/15 1823   06/04/15 1230  piperacillin-tazobactam (ZOSYN) IVPB 3.375 g     3.375 g 100 mL/hr over 30 Minutes Intravenous  Once 06/04/15 1226 06/04/15 1505   05/28/15 0930  cefOXitin (MEFOXIN) 2 g in dextrose 5 % 50 mL IVPB    Comments:  Pharmacy may adjust dosing strength, interval, or rate of medication as needed for optimal therapy for the patient Send with patient on call to the OR.  Anesthesia to  complete antibiotic administration <86min prior to incision per [facility]   2 g 100 mL/hr over 30 Minutes Intravenous On call to O.R. 05/28/15 0919 05/28/15 0930   05/28/15 0600  cefOXitin (MEFOXIN) 2 g in dextrose 5 % 50 mL IVPB  Status:  Discontinued    Comments:  Pharmacy may adjust dosing strength, interval, or rate of medication as needed for optimal therapy for the patient Send with patient on call to the OR.  Anesthesia to complete antibiotic administration <50min prior to incision per [facility].   2 g 100 mL/hr over 30 Minutes Intravenous On call to O.R. 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   -Unresectable  SBO secondary to above POD #9 s/p ex lap, ileocolonic bypass, leaking G tube.   -Ileus resolved. -Encouraged  ambulation and IS.  -Pain control, antiemetics -Continue G-tube clamp, g-tube leaking -  WOC following, has ostomy pouch to manage and skin barrier. -Staples removed 6/30 secondary to trapped infected seroma under the incision, cellulitic changes, and slough/necrosis at the base of the wound, WD dressing change TID for now to help clean up the wound Not yet ready for wound vac.   -Send cultures, pending, Start vanc/zosyn Day #3  Infected seroma- await culture.    Wound dehiscence -abdominal binder for comfort when ambulating G tube also can serve palliative role as SBO may reoccur with progression of malignancy.    Severe protein calorie malnutrition.   -Dietitian following, tolerating soft diet, d/c TF now that taking in more PO, encourage supplements -She is taking in some outside food (smoothies/drinks) with protein powder.  Add megace for appetite stimulation.  Discussed role for eating every 2 hours or so to maximize intake.    Multiple medical problems -- per primary service  Disp:  Probably home tomorrow in the absence of other events.  Would d/c with total 7 days antibiotics.     LOS: 15 days

## 2015-06-06 NOTE — Progress Notes (Signed)
PROGRESS NOTE    Ashley Hurley RXV:[id number] DOB: 22-Apr-1951 DOA: 05/22/2015 PCP: Lottie Dawson, MD  HPI/Brief narrative 64 y.o. F with hx breast CA s/p mastectomy, admitted 6/17 with N/V and abd pain. Found to have pancreatic mass, liver mass, SBO. Failed conservative management for SBO therefor taken to OR 6/23 for ex lap. Following surgery, she remained hypotensive; therefore, PCCM was consulted. Patient transferred to [facility] on 6/27.   Assessment/Plan:  Active Problems: SBO secondary to pancreatic mass/adenocarcinoma, s/p exp Lap 6/23 - s/p ex lap 6/23, with ileocolonic bypass (palliative), gastrostomy tube placement (Dr. Marlou Starks), post op day #9 - Pancreatic mass - unresectable - ileus resolved. - G-tube clamped 6/29, staples removed 6/30 secondary to trapped infected seroma. Now complicated by large subcutaneous infected seroma-see below. G-tube leaking-WOC following, has ostomy pouch to manage and skin barrier. - Management per general surgery-follow-up appreciated - TPN and tube feeds discontinued.  Large subcutaneous infected Seroma/wound dehiscence - At site of exploratory laparotomy - Wound now open.  - Started IV vancomycin and Zosyn. - WOC input appreciated. - Follow wound culture results: Pending. - G-tube also serving palliative role as his by mouth may recur with progression of malignancy  Severe PCM - in the setting of the acute on chronic illness and pancreatic cancer - Dietitian following - Tolerating soft diet. Encourage supplements. She is taking some outside food with protein powder. Megace for appetite stimulation. Advised to eat every 12 hours or so to maximize intake. - DC'd tube feeds.  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 Gearldine Shown brief note appreciated and  discussed with him. He has arranged a follow-up appointment with him on 06/19/15.   - Not resectable.  Post op hypotension secondary to hypovolemia and adrenal insufficiency  - Resolved. Chronic soft blood pressures but asymptomatic of same.  Anemia of chronic disease, malignancy - no signs of active bleeding - Stable. Transfuse if hemoglobin <7 g per DL. - Transfused 1 unit of PRBC for hemoglobin of 7 on 6/30. Hemoglobin improved to 7.9. Hemoglobin dropped down to 7.7.  Acute hypoxic respiratory failure - post op atelectasis and effusion at the right lung base, now resolved   Right hydroureteronephrosis  - secondary to a soft tissue mass in the right lower pelvis - Urologist recommend conservative management of asymptomatic hydronephrosis at this time.  - since Cr trending down, no stenting needed at this time  - Creatinine has remained normal.  Acute renal failure - Secondary to right hydroureteronephrosis and prerenal etiology from dehydration in the setting of SBO - IV fluids provided, creatinine remains WNL  - IVF have been discontinued 6/27 due to volume overload  - Monitor creatinine periodically  Volume overload in the setting of severe hypoalbuminemia and post op hypotension requiring IVF - at baseline ~ 130 lbs but now up to 170's lbs - Will gradually get better with improving nutritional status - No significant diuresis with IV Lasix given on 6/28. Increased IV Lasix 7/2.  Hypokalemia - Replace as needed and follow  Leaking G-tube - WOC consultation and management per surgery  Right lower extremity swelling - Lower extremity venous Doppler negative for DVT. Likely from asymmetric edema related to IV fluid resuscitation and hypoalbuminemia. - Increase IV Lasix.      DVT prophylaxis - Lovenox SQ Code Status: Full.  Family Communication: Discussed with patient's significant other and  a friend at bedside on 7/1.  Disposition Plan: Home and medically stable  and cleared by surgery.  Possible DC home 7/3.   SIGNIFICANT EVENTS: 6/17 - admit. 6/20 - liver biopsy (pathology positive for adenocarcinoma - pancreatobiliary). 6/23 - ex lap with SB to left colon bypass (palliative), gastrostomy tube placement. 6/26 - transferred from ICU to medical floor  6/27 - Pt transferred from [facility] to [facility] care; on TPN 6/28 - Tolerating clear liquids, has had BM 6/29-G-tube clamped and starting soft diet  Consultants:  Surgery  Oncology  Procedures: 6/20 - liver biopsy (pathology positive for adenocarcinoma - pancreatobiliary). 6/23 - ex lap with SB to left colon bypass (palliative), gastrostomy tube placement. - RUE PICC 6/22 >>>  Antibiotics:  IV vancomycin 6/30 >  IV Zosyn 6/30 >  Subjective: Overall feels better. Much decreased drainage after urostomy bag placed adjacent to G-tube. Decreased drainage from abdominal wound.  Objective: Filed Vitals:   06/05/15 0701 06/05/15 1330 06/05/15 2158 06/06/15 0548  BP: 92/61 90/55 90/56  90/58  Pulse: 94 93 96 97  Temp: 99.3 F (37.4 C) 98.3 F (36.8 C) 98.7 F (37.1 C) 98.3 F (36.8 C)  TempSrc: Oral Oral Oral Oral  Resp: 17 18 16 16   Height:      Weight: 79.107 kg (174 lb 6.4 oz)   78.608 kg (173 lb 4.8 oz)  SpO2: 94% 96% 96% 97%    Intake/Output Summary (Last 24 hours) at 06/06/15 1146 Last data filed at 06/06/15 0920  Gross per 24 hour  Intake   1667 ml  Output    540 ml  Net   1127 ml   Filed Weights   06/04/15 0628 06/05/15 0701 06/06/15 0548  Weight: 74.481 kg (164 lb 3.2 oz) 79.107 kg (174 lb 6.4 oz) 78.608 kg (173 lb 4.8 oz)     Exam:  General exam: Pleasant middle-aged female in sitting up comfortably in bed this morning. Marland Kitchen Respiratory system: clear to auscultation. No increased work of breathing. Cardiovascular system: S1 & S2 heard, RRR. No JVD, murmurs, gallops, clicks. Trace bilateral leg edema, right >left. Not on telemetry. Gastrointestinal system: Abdomen is  nondistended, soft and mild appropriate tenderness. Dressing over surgical site and around G tube clean and dry. Urostomy pouch next to G-tube with small amount of brown drainage. Normal bowel sounds heard. Central nervous system: Alert and oriented. No focal neurological deficits. Extremities: Symmetric 5 x 5 power.   Data Reviewed: Basic Metabolic Panel:  Recent Labs Lab 05/31/15 0428 06/01/15 2725 06/02/15 0640 06/03/15 0440 06/04/15 0510 06/05/15 0417 06/06/15 0425  NA 138 136 134* 137 133* 133* 133*  K 4.2 4.3 4.2 3.4* 3.2* 3.6 3.3*  CL 112* 111 108 104 104 101 101  CO2 22 21* 22 24 25 22 24   GLUCOSE 201* 196* 205* 163* 178* 145* 137*  BUN 16 19 27* 31* 33* 27* 21*  CREATININE 0.89 0.92 0.98 1.11* 1.11* 1.15* 1.07*  CALCIUM 7.4* 7.9* 7.5* 7.4* 7.4* 7.2* 7.2*  MG 2.2 1.9 2.0 1.7 1.7  --   --   PHOS 2.9 3.6 3.8  --  3.3  --   --    Liver Function Tests:  Recent Labs Lab 05/31/15 0428 06/01/15 0628 06/03/15 0440 06/04/15 0510  AST 57* 43* 27 25  ALT 46 54 44 40  ALKPHOS 111 179* 210* 162*  BILITOT 1.9* 1.4* 2.3* 3.3*  PROT 4.0* 4.1* 4.0* 4.1*  ALBUMIN 1.2* 1.3* 1.2* 1.2*   No results  for input(s): LIPASE, AMYLASE in the last 168 hours. No results for input(s): AMMONIA in the last 168 hours. CBC:  Recent Labs Lab 05/31/15 0428 06/01/15 0628  06/04/15 0510 06/04/15 0909 06/04/15 1535 06/05/15 0417 06/06/15 0425  WBC 8.8 7.5  < > 9.1 10.2 10.5 9.2 7.6  NEUTROABS 7.4 6.0  --   --   --   --   --   --   HGB 7.6* 7.9*  < > 6.4* 7.0* 7.9* 8.0* 7.7*  HCT 22.2* 22.8*  < > 18.3* 19.8* 22.3* 22.6* 21.3*  MCV 91.7 90.5  < > 88.8 90.0 85.4 85.6 87.3  PLT 190 212  < > 199 213 208 229 203  < > = values in this interval not displayed. Cardiac Enzymes: No results for input(s): CKTOTAL, CKMB, CKMBINDEX, TROPONINI in the last 168 hours. BNP (last 3 results) No results for input(s): PROBNP in the last 8760 hours. CBG:  Recent Labs Lab 06/04/15 1939 06/04/15 2303  06/05/15 0333 06/05/15 0734 06/05/15 1155  GLUCAP 163* 137* 148* 134* 145*    Recent Results (from the past 240 hour(s))  Surgical pcr screen     Status: Abnormal   Collection Time: 05/28/15  7:39 AM  Result Value Ref Range Status   MRSA, PCR NEGATIVE NEGATIVE Final   Staphylococcus aureus POSITIVE (A) NEGATIVE Final    Comment:        The Xpert SA Assay (FDA approved for NASAL specimens in patients over 85 years of age), is one component of a comprehensive surveillance program.  Test performance has been validated by [facility] for patients greater than or equal to 29 year old. It is not intended to diagnose infection nor to guide or monitor treatment.   Wound culture     Status: None (Preliminary result)   Collection Time: 06/05/15  9:41 AM  Result Value Ref Range Status   Specimen Description WOUND ABDOMEN  Final   Special Requests Immunocompromised ABDOMINAL INCISIONAL FLUID SWAB   Final   Gram Stain PENDING  Incomplete   Culture   Final    Culture reincubated for better growth Performed at [facility]    Report Status PENDING  Incomplete        Studies: No results found.      Scheduled Meds: . antiseptic oral rinse  7 mL Mouth Rinse q12n4p  . chlorhexidine  15 mL Mouth Rinse BID  . enoxaparin (LOVENOX) injection  40 mg Subcutaneous QHS  . escitalopram  20 mg Oral Daily  . feeding supplement (ENSURE ENLIVE)  237 mL Oral BID BM  . furosemide  40 mg Intravenous Daily  . hydrocortisone sodium succinate  25 mg Intravenous Daily  . megestrol  200 mg Oral BID  . pantoprazole  40 mg Oral Daily  . piperacillin-tazobactam (ZOSYN)  IV  3.375 g Intravenous Q8H  . piperacillin-tazobactam (ZOSYN)  IV  3.375 g Intravenous Q8H  . vancomycin  750 mg Intravenous Q12H   Continuous Infusions:    Active Problems:   Liver mass, left lobe   Pancreatic mass   SBO (small bowel obstruction)   Abdominal pain   Protein-calorie malnutrition, severe   Liver  mass   Nausea and vomiting   Arterial hypotension   Atelectasis   Hydroureter, right   Pulmonary edema    Time spent: 25 minutes    Yola Paradiso, MD, FACP, FHM. [facility] Pager [phone]  If 7PM-7AM, please contact night-coverage [email or web address] Password [facility] 06/06/2015, 11:46 AM  LOS: 15 days

## 2015-06-07 DIAGNOSIS — D649 Anemia, unspecified: Secondary | ICD-10-CM

## 2015-06-07 LAB — BASIC METABOLIC PANEL
Anion gap: 7 (ref 5–15)
BUN: 15 mg/dL (ref 6–20)
CO2: 28 mmol/L (ref 22–32)
Calcium: 7.1 mg/dL — ABNORMAL LOW (ref 8.9–10.3)
Chloride: 101 mmol/L (ref 101–111)
Creatinine, Ser: 1.17 mg/dL — ABNORMAL HIGH (ref 0.44–1.00)
GFR calc non Af Amer: 48 mL/min — ABNORMAL LOW (ref >60)
GFR, EST AFRICAN AMERICAN: 55 mL/min — AB (ref >60)
Glucose, Bld: 139 mg/dL — ABNORMAL HIGH (ref 65–99)
POTASSIUM: 3 mmol/L — AB (ref 3.5–5.1)
Sodium: 136 mmol/L (ref 135–145)

## 2015-06-07 LAB — CBC
HCT: 20.1 % — ABNORMAL LOW (ref 36.0–46.0)
HEMOGLOBIN: 7.2 g/dL — AB (ref 12.0–15.0)
MCH: 30.5 pg (ref 26.0–34.0)
MCHC: 35.8 g/dL (ref 30.0–36.0)
MCV: 85.2 fL (ref 78.0–100.0)
Platelets: 226 10*3/uL (ref 150–400)
RBC: 2.36 MIL/uL — AB (ref 3.87–5.11)
RDW: 16.4 % — ABNORMAL HIGH (ref 11.5–15.5)
WBC: 7.8 10*3/uL (ref 4.0–10.5)

## 2015-06-07 LAB — MAGNESIUM: MAGNESIUM: 1.6 mg/dL — AB (ref 1.7–2.4)

## 2015-06-07 LAB — PREPARE RBC (CROSSMATCH)

## 2015-06-07 MED ORDER — SODIUM CHLORIDE 0.9 % IV SOLN: Freq: Once | INTRAVENOUS | Status: DC

## 2015-06-07 MED ORDER — HYDROCORTISONE 10 MG PO TABS
10.0000 mg | ORAL_TABLET | Freq: Every day | ORAL | Status: DC
  Administered 2015-06-07 – 2015-06-08 (×2): 10 mg via ORAL
  Filled 2015-06-07 (×2): qty 1

## 2015-06-07 MED ORDER — POTASSIUM CHLORIDE CRYS ER 20 MEQ PO TBCR
40.0000 meq | EXTENDED_RELEASE_TABLET | ORAL | Status: AC
  Administered 2015-06-07 (×2): 40 meq via ORAL
  Filled 2015-06-07 (×3): qty 2

## 2015-06-07 NOTE — Progress Notes (Signed)
PROGRESS NOTE    Ashley Hurley BSJ:[id number] DOB: [birth year] DOA: 05/22/2015 PCP: Lottie Dawson, MD  HPI/Brief narrative 64 y.o. F with hx breast CA s/p mastectomy, admitted 6/17 with N/V and abd pain. Found to have pancreatic mass, liver mass, SBO. Failed conservative management for SBO therefor taken to OR 6/23 for ex lap. Following surgery, she remained hypotensive; therefore, PCCM was consulted. Patient transferred to [facility] on 6/27.   Assessment/Plan:  Active Problems: SBO secondary to pancreatic mass/adenocarcinoma, s/p exp Lap 6/23 - s/p ex lap 6/23, with ileocolonic bypass (palliative), gastrostomy tube placement (Dr. Marlou Starks), post op day #9 - Pancreatic mass - unresectable - ileus resolved. - G-tube clamped 6/29, staples removed 6/30 secondary to trapped infected seroma. Now complicated by large subcutaneous infected seroma-see below. G-tube leaking-WOC following, has ostomy pouch to manage and skin barrier. - Management per general surgery-follow-up appreciated - TPN and tube feeds discontinued.  Large subcutaneous infected Seroma/wound dehiscence - At site of exploratory laparotomy - Wound now open.  - Started IV vancomycin and Zosyn. - WOC input appreciated. - Follow wound culture results: Pending. - G-tube also serving palliative role as his by mouth may recur with progression of malignancy - Wound improving.  Severe PCM - in the setting of the acute on chronic illness and pancreatic cancer - Dietitian following - Tolerating soft diet. Encourage supplements. She is taking some outside food with protein powder. Megace for appetite stimulation. Advised to eat every 12 hours or so to maximize intake. - DC'd tube feeds. - Appetite improving.  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  Gearldine Shown brief note appreciated and discussed with him. He has arranged a follow-up appointment with him on 06/19/15.   - Not resectable.  Post op hypotension secondary to hypovolemia and relative adrenal insufficiency  - Resolved. Chronic soft blood pressures but asymptomatic of same. - Blood pressures at times in the 80s. Will start hydrocortisone low-dose.  Anemia of chronic disease, malignancy - no signs of active bleeding - Stable. Transfuse if hemoglobin <7 g per DL. - Transfused 1 unit of PRBC for hemoglobin of 7 on 6/30. - Hemoglobin had improved to 7.9 but then has gradually drifted down to 7.2 on 7/3. No reported bleeding. Draining in pouch around G-tube not bloody or melena. Will not check output for occult blood because it is expected to be trace positive. - Transfuse 2 units PRBCs and follow CBCs.  Acute hypoxic respiratory failure - post op atelectasis and effusion at the right lung base, now resolved   Right hydroureteronephrosis  - secondary to a soft tissue mass in the right lower pelvis - Urologist recommend conservative management of asymptomatic hydronephrosis at this time.  - since Cr trending down, no stenting needed at this time  - Creatinine has remained normal.  Acute renal failure - Secondary to right hydroureteronephrosis and prerenal etiology from dehydration in the setting of SBO - IV fluids provided, creatinine remains WNL  - IVF have been discontinued 6/27 due to volume overload  - Monitor creatinine periodically  Volume overload in the setting of severe hypoalbuminemia and post op hypotension requiring IVF - at baseline ~ 130 lbs but now up to 170's lbs - Will gradually get better with improving nutritional status - No significant diuresis with IV Lasix given on 6/28. Increased IV Lasix 7/2. - Leg edema improved.  Hypokalemia -  Replace as needed and follow  Leaking G-tube - WOC consultation and management per surgery. Improved after ostomy  bag around G-tube.  Right lower extremity swelling - Lower extremity venous Doppler negative for DVT. Likely from asymmetric edema related to IV fluid resuscitation and hypoalbuminemia. - Increaseed IV Lasix. Improved.      DVT prophylaxis - Lovenox SQ Code Status: Full.  Family Communication: None at bedside today. Disposition Plan: Home and medically stable and cleared by surgery.  Possible DC home in the next 1-2 days.   SIGNIFICANT EVENTS: 6/17 - admit. 6/20 - liver biopsy (pathology positive for adenocarcinoma - pancreatobiliary). 6/23 - ex lap with SB to left colon bypass (palliative), gastrostomy tube placement. 6/26 - transferred from ICU to medical floor  6/27 - Pt transferred from [facility] to [facility] care; on TPN 6/28 - Tolerating clear liquids, has had BM 6/29-G-tube clamped and starting soft diet  Consultants:  Surgery  Oncology  Procedures: 6/20 - liver biopsy (pathology positive for adenocarcinoma - pancreatobiliary). 6/23 - ex lap with SB to left colon bypass (palliative), gastrostomy tube placement. - RUE PICC 6/22 >>>  Antibiotics:  IV vancomycin 6/30 >  IV Zosyn 6/30 >  Subjective: No drainage around G-tube. Laparotomy wound drainage significantly improved. Appetite slightly better. Flatus +. No BM since 6/29.  Objective: Filed Vitals:   06/06/15 0548 06/06/15 1341 06/06/15 2109 06/07/15 0516  BP: 90/58 95/57 82/52  86/52  Pulse: 97 95 77 85  Temp: 98.3 F (36.8 C) 98.4 F (36.9 C) 98.4 F (36.9 C) 98.3 F (36.8 C)  TempSrc: Oral Oral Oral Oral  Resp: 16 16 17 17   Height:      Weight: 78.608 kg (173 lb 4.8 oz)   77.928 kg (171 lb 12.8 oz)  SpO2: 97% 95% 96% 96%    Intake/Output Summary (Last 24 hours) at 06/07/15 1148 Last data filed at 06/07/15 0600  Gross per 24 hour  Intake   1400 ml  Output    260 ml  Net   1140 ml   Filed Weights   06/05/15 0701 06/06/15 0548 06/07/15 0516  Weight: 79.107 kg (174 lb 6.4 oz) 78.608 kg (173 lb 4.8  oz) 77.928 kg (171 lb 12.8 oz)     Exam:  General exam: Pleasant middle-aged female in sitting up comfortably in bed this morning. Marland Kitchen Respiratory system: clear to auscultation. No increased work of breathing. Cardiovascular system: S1 & S2 heard, RRR. No JVD, murmurs, gallops, clicks. Trace bilateral leg edema, right >left. Edema significantly improved. Gastrointestinal system: Abdomen is nondistended, soft and mild appropriate tenderness. Dressing over surgical site and around G tube clean and dry. Urostomy pouch next to G-tube with small amount of brown drainage. Normal bowel sounds heard. Central nervous system: Alert and oriented. No focal neurological deficits. Extremities: Symmetric 5 x 5 power.   Data Reviewed: Basic Metabolic Panel:  Recent Labs Lab 06/01/15 0628 06/02/15 0640 06/03/15 0440 06/04/15 0510 06/05/15 0417 06/06/15 0425 06/07/15 0500  NA 136 134* 137 133* 133* 133* 136  K 4.3 4.2 3.4* 3.2* 3.6 3.3* 3.0*  CL 111 108 104 104 101 101 101  CO2 21* 22 24 25 22 24 28   GLUCOSE 196* 205* 163* 178* 145* 137* 139*  BUN 19 27* 31* 33* 27* 21* 15  CREATININE 0.92 0.98 1.11* 1.11* 1.15* 1.07* 1.17*  CALCIUM 7.9* 7.5* 7.4* 7.4* 7.2* 7.2* 7.1*  MG 1.9 2.0 1.7 1.7  --   --   --   PHOS 3.6  3.8  --  3.3  --   --   --    Liver Function Tests:  Recent Labs Lab 06/01/15 0628 06/03/15 0440 06/04/15 0510  AST 43* 27 25  ALT 54 44 40  ALKPHOS 179* 210* 162*  BILITOT 1.4* 2.3* 3.3*  PROT 4.1* 4.0* 4.1*  ALBUMIN 1.3* 1.2* 1.2*   No results for input(s): LIPASE, AMYLASE in the last 168 hours. No results for input(s): AMMONIA in the last 168 hours. CBC:  Recent Labs Lab 06/01/15 0628  06/04/15 0909 06/04/15 1535 06/05/15 0417 06/06/15 0425 06/07/15 0500  WBC 7.5  < > 10.2 10.5 9.2 7.6 7.8  NEUTROABS 6.0  --   --   --   --   --   --   HGB 7.9*  < > 7.0* 7.9* 8.0* 7.7* 7.2*  HCT 22.8*  < > 19.8* 22.3* 22.6* 21.3* 20.1*  MCV 90.5  < > 90.0 85.4 85.6 87.3 85.2    PLT 212  < > 213 208 229 203 226  < > = values in this interval not displayed. Cardiac Enzymes: No results for input(s): CKTOTAL, CKMB, CKMBINDEX, TROPONINI in the last 168 hours. BNP (last 3 results) No results for input(s): PROBNP in the last 8760 hours. CBG:  Recent Labs Lab 06/04/15 1939 06/04/15 2303 06/05/15 0333 06/05/15 0734 06/05/15 1155  GLUCAP 163* 137* 148* 134* 145*    Recent Results (from the past 240 hour(s))  Wound culture     Status: None (Preliminary result)   Collection Time: 06/05/15  9:41 AM  Result Value Ref Range Status   Specimen Description WOUND ABDOMEN  Final   Special Requests Immunocompromised ABDOMINAL INCISIONAL FLUID SWAB   Final   Gram Stain   Final    NO WBC SEEN NO SQUAMOUS EPITHELIAL CELLS SEEN FEW GRAM NEGATIVE RODS FEW GRAM POSITIVE COCCI IN PAIRS RARE GRAM POSITIVE RODS    Culture   Final    Culture reincubated for better growth Performed at [facility]    Report Status PENDING  Incomplete        Studies: No results found.      Scheduled Meds: . sodium chloride   Intravenous Once  . antiseptic oral rinse  7 mL Mouth Rinse q12n4p  . chlorhexidine  15 mL Mouth Rinse BID  . enoxaparin (LOVENOX) injection  40 mg Subcutaneous QHS  . escitalopram  20 mg Oral Daily  . feeding supplement (ENSURE ENLIVE)  237 mL Oral BID BM  . furosemide  40 mg Intravenous Daily  . megestrol  200 mg Oral BID  . pantoprazole  40 mg Oral Daily  . piperacillin-tazobactam (ZOSYN)  IV  3.375 g Intravenous Q8H  . potassium chloride  40 mEq Oral Q4H  . vancomycin  750 mg Intravenous Q12H   Continuous Infusions:    Active Problems:   Liver mass, left lobe   Pancreatic mass   SBO (small bowel obstruction)   Abdominal pain   Protein-calorie malnutrition, severe   Liver mass   Nausea and vomiting   Arterial hypotension   Atelectasis   Hydroureter, right   Pulmonary edema    Time spent: 25 minutes    Khylon Davies, MD,  FACP, FHM. [facility] Pager [phone]  If 7PM-7AM, please contact night-coverage [email or web address] Password [email or web address] 06/07/2015, 11:48 AM    LOS: 16 days

## 2015-06-07 NOTE — Progress Notes (Signed)
BP left arm low at 80/48.  Dr. Algis Liming notified, order received to DC lasix and retake BP 30 minutes.

## 2015-06-07 NOTE — Progress Notes (Signed)
[facility] Progress Note  10 Days Post-Op  Subjective: Pt feels good, appetite improving, no N/V.  Ambulating well.  Has Montgomery straps in place and ostomy bag around g-tube.  Wound cleaning up well, but still with foul smell, sough improving.  Having BM's, but last one was 06/03/15.    Objective: Vital signs in last 24 hours: Temp:  [98.3 F (36.8 C)-98.4 F (36.9 C)] 98.3 F (36.8 C) (07/03 0516) Pulse Rate:  [77-95] 85 (07/03 0516) Resp:  [16-17] 17 (07/03 0516) BP: (82-95)/(52-57) 86/52 mmHg (07/03 0516) SpO2:  [95 %-96 %] 96 % (07/03 0516) Weight:  [77.928 kg (171 lb 12.8 oz)] 77.928 kg (171 lb 12.8 oz) (07/03 0516) Last BM Date: 06/03/15  Intake/Output from previous day: 07/02 0701 - 07/03 0700 In: 1640 [P.O.:1140; IV Piggyback:500] Out: 750 [Urine:650; Drains:100] Intake/Output this shift:    PE: Gen: Alert, NAD, pleasant Abd: Soft, not very tender, slough much less significant, evidence of anterior fascial necrosis, +BS, no HSM, midline wound with much less yellow slough and foul odor, no significant wound dehiscence. Good granulation tissue formation now at base of wound.  G-tube in place with ostomy pouch with greenish/brown enteric contents leaking around the g-tube.   Lab Results:   Recent Labs  06/06/15 0425 06/07/15 0500  WBC 7.6 7.8  HGB 7.7* 7.2*  HCT 21.3* 20.1*  PLT 203 226   BMET  Recent Labs  06/06/15 0425 06/07/15 0500  NA 133* 136  K 3.3* 3.0*  CL 101 101  CO2 24 28  GLUCOSE 137* 139*  BUN 21* 15  CREATININE 1.07* 1.17*  CALCIUM 7.2* 7.1*   PT/INR No results for input(s): LABPROT, INR in the last 72 hours. CMP     Component Value Date/Time   NA 136 06/07/2015 0500   K 3.0* 06/07/2015 0500   CL 101 06/07/2015 0500   CO2 28 06/07/2015 0500   GLUCOSE 139* 06/07/2015 0500   BUN 15 06/07/2015 0500   CREATININE 1.17* 06/07/2015 0500   CALCIUM 7.1* 06/07/2015 0500   PROT 4.1* 06/04/2015 0510   ALBUMIN 1.2*  06/04/2015 0510   AST 25 06/04/2015 0510   ALT 40 06/04/2015 0510   ALKPHOS 162* 06/04/2015 0510   BILITOT 3.3* 06/04/2015 0510   GFRNONAA 48* 06/07/2015 0500   GFRAA 55* 06/07/2015 0500   Lipase     Component Value Date/Time   LIPASE 57* 05/22/2015 1153       Studies/Results: No results found.  Anti-infectives: Anti-infectives    Start     Dose/Rate Route Frequency Ordered Stop   06/06/15 1030  piperacillin-tazobactam (ZOSYN) IVPB 3.375 g  Status:  Discontinued     3.375 g 12.5 mL/hr over 240 Minutes Intravenous Every 8 hours 06/06/15 1022 06/07/15 0759   06/05/15 0200  vancomycin (VANCOCIN) IVPB 750 mg/150 ml premix     750 mg 150 mL/hr over 60 Minutes Intravenous Every 12 hours 06/04/15 1230     06/04/15 1800  piperacillin-tazobactam (ZOSYN) IVPB 3.375 g     3.375 g 12.5 mL/hr over 240 Minutes Intravenous Every 8 hours 06/04/15 1230     06/04/15 1300  vancomycin (VANCOCIN) 1,500 mg in sodium chloride 0.9 % 500 mL IVPB     1,500 mg 250 mL/hr over 120 Minutes Intravenous  Once 06/04/15 1227 06/04/15 1823   06/04/15 1230  piperacillin-tazobactam (ZOSYN) IVPB 3.375 g     3.375 g 100 mL/hr over 30 Minutes Intravenous  Once 06/04/15 1226 06/04/15 1505  05/28/15 0930  cefOXitin (MEFOXIN) 2 g in dextrose 5 % 50 mL IVPB    Comments:  Pharmacy may adjust dosing strength, interval, or rate of medication as needed for optimal therapy for the patient Send with patient on call to the OR.  Anesthesia to complete antibiotic administration <7min prior to incision per [facility].   2 g 100 mL/hr over 30 Minutes Intravenous On call to O.R. 05/28/15 0919 05/28/15 0930   05/28/15 0600  cefOXitin (MEFOXIN) 2 g in dextrose 5 % 50 mL IVPB  Status:  Discontinued    Comments:  Pharmacy may adjust dosing strength, interval, or rate of medication as needed for optimal therapy for the patient Send with patient on call to the OR.  Anesthesia to complete antibiotic administration <77min prior  to incision per [facility].   2 g 100 mL/hr over 30 Minutes Intravenous On call to O.R. 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  -Unresectable  SBO secondary to above POD #10 s/p ex lap, ileocolonic bypass, G-tube placement - leaking G tube.  -Ileus resolved. -Encouraged ambulation and IS.  -Pain control, antiemetics -Continue G-tube clamp, g-tube leaking -WOC following, has ostomy pouch to manage and skin barrier. -Staples removed 6/30 secondary to trapped infected seroma under the incision, cellulitic changes, and slough/necrosis at the base of the wound, WD dressing change TID for now to help clean up the wound -G tube also can serve palliative role as SBO may reoccur with progression of malignancy.   Infected seroma -Culture shows gram neg rods, gram positive, cocci in pairs gram positive rods, Cont vanc/zosyn Day #4 -Await culture sensitivities.   Wound dehiscence -Abdominal binder for comfort when ambulating -Montgomery straps -Not yet ready for wound vac, maybe in 4-5 days  Severe protein calorie malnutrition.  -Dietitian following, tolerating soft diet, encourage supplements -She is taking in some outside food (smoothies/drinks) with protein powder. Add megace for appetite stimulation. Discussed role for eating every 2 hours or so to maximize intake.   Multiple medical problems -- per primary service  Disp:  Home when blood counts stabilized. Would d/c with total 7 days antibiotics depending on sensitivities.     LOS: 16 days    Nat Christen 06/07/2015, 8:05 AM Pager: [phone]

## 2015-06-08 DIAGNOSIS — K56609 Unspecified intestinal obstruction, unspecified as to partial versus complete obstruction: Secondary | ICD-10-CM | POA: Insufficient documentation

## 2015-06-08 DIAGNOSIS — T8130XA Disruption of wound, unspecified, initial encounter: Secondary | ICD-10-CM | POA: Insufficient documentation

## 2015-06-08 DIAGNOSIS — D649 Anemia, unspecified: Secondary | ICD-10-CM | POA: Insufficient documentation

## 2015-06-08 DIAGNOSIS — C259 Malignant neoplasm of pancreas, unspecified: Secondary | ICD-10-CM | POA: Insufficient documentation

## 2015-06-08 LAB — TYPE AND SCREEN
ABO/RH(D): O NEG
Antibody Screen: NEGATIVE
Unit division: 0
Unit division: 0

## 2015-06-08 LAB — BASIC METABOLIC PANEL
ANION GAP: 7 (ref 5–15)
BUN: 13 mg/dL (ref 6–20)
CALCIUM: 7.1 mg/dL — AB (ref 8.9–10.3)
CO2: 26 mmol/L (ref 22–32)
Chloride: 102 mmol/L (ref 101–111)
Creatinine, Ser: 1.27 mg/dL — ABNORMAL HIGH (ref 0.44–1.00)
GFR, EST AFRICAN AMERICAN: 50 mL/min — AB (ref >60)
GFR, EST NON AFRICAN AMERICAN: 43 mL/min — AB (ref >60)
GLUCOSE: 136 mg/dL — AB (ref 65–99)
Potassium: 3.4 mmol/L — ABNORMAL LOW (ref 3.5–5.1)
SODIUM: 135 mmol/L (ref 135–145)

## 2015-06-08 LAB — CBC WITH DIFFERENTIAL/PLATELET
BASOS PCT: 1 % (ref 0–1)
Basophils Absolute: 0.1 10*3/uL (ref 0.0–0.1)
EOS PCT: 0 % (ref 0–5)
Eosinophils Absolute: 0 10*3/uL (ref 0.0–0.7)
HCT: 29.1 % — ABNORMAL LOW (ref 36.0–46.0)
HEMOGLOBIN: 10.3 g/dL — AB (ref 12.0–15.0)
Lymphocytes Relative: 9 % — ABNORMAL LOW (ref 12–46)
Lymphs Abs: 1 10*3/uL (ref 0.7–4.0)
MCH: 29.9 pg (ref 26.0–34.0)
MCHC: 35.4 g/dL (ref 30.0–36.0)
MCV: 84.3 fL (ref 78.0–100.0)
MONO ABS: 0.9 10*3/uL (ref 0.1–1.0)
Monocytes Relative: 8 % (ref 3–12)
NEUTROS PCT: 82 % — AB (ref 43–77)
Neutro Abs: 8.9 10*3/uL — ABNORMAL HIGH (ref 1.7–7.7)
Platelets: 219 10*3/uL (ref 150–400)
RBC: 3.45 MIL/uL — ABNORMAL LOW (ref 3.87–5.11)
RDW: 15.1 % (ref 11.5–15.5)
WBC: 10.9 10*3/uL — ABNORMAL HIGH (ref 4.0–10.5)

## 2015-06-08 MED ORDER — OXYCODONE HCL 5 MG PO TABS: 5.0000 mg | ORAL_TABLET | Freq: Four times a day (QID) | ORAL | Status: AC | PRN

## 2015-06-08 MED ORDER — DOCUSATE SODIUM 100 MG PO CAPS
100.0000 mg | ORAL_CAPSULE | Freq: Two times a day (BID) | ORAL | Status: DC
  Administered 2015-06-08: 100 mg via ORAL
  Filled 2015-06-08: qty 1

## 2015-06-08 MED ORDER — MEGESTROL ACETATE 40 MG/ML PO SUSP: 200.0000 mg | Freq: Two times a day (BID) | ORAL | Status: AC

## 2015-06-08 MED ORDER — CIPROFLOXACIN HCL 500 MG PO TABS: 500.0000 mg | ORAL_TABLET | Freq: Two times a day (BID) | ORAL | Status: AC

## 2015-06-08 MED ORDER — BISACODYL 10 MG RE SUPP: 10.0000 mg | Freq: Every day | RECTAL | Status: DC | PRN

## 2015-06-08 MED ORDER — HYDROCORTISONE 10 MG PO TABS: 10.0000 mg | ORAL_TABLET | Freq: Every day | ORAL | Status: AC

## 2015-06-08 MED ORDER — DOCUSATE SODIUM 100 MG PO CAPS: 100.0000 mg | ORAL_CAPSULE | Freq: Two times a day (BID) | ORAL | Status: DC

## 2015-06-08 MED ORDER — POTASSIUM CHLORIDE CRYS ER 20 MEQ PO TBCR
40.0000 meq | EXTENDED_RELEASE_TABLET | Freq: Once | ORAL | Status: AC
  Administered 2015-06-08: 40 meq via ORAL
  Filled 2015-06-08: qty 2

## 2015-06-08 MED ORDER — MAGNESIUM OXIDE 400 MG PO TABS: 400.0000 mg | ORAL_TABLET | Freq: Two times a day (BID) | ORAL | Status: DC

## 2015-06-08 MED ORDER — ENSURE ENLIVE PO LIQD: 237.0000 mL | Freq: Two times a day (BID) | ORAL | Status: AC

## 2015-06-08 NOTE — Progress Notes (Signed)
Discharge home. Home discharge instruction given, no question verbalized. 

## 2015-06-08 NOTE — Progress Notes (Addendum)
ANTIBIOTIC CONSULT NOTE - FOLLOW UP  Pharmacy Consult for vancomycin and zosyn Indication: wound infection  Allergies  Allergen Reactions  . Aspirin     Patient Measurements: Height: 5\' 6"  (167.6 cm) Weight: 167 lb 1.7 oz (75.8 kg) IBW/kg (Calculated) : 59.3   Vital Signs: Temp: 98.3 F (36.8 C) (07/04 0542) Temp Source: Oral (07/04 0542) BP: 91/64 mmHg (07/04 0542) Pulse Rate: 84 (07/04 0542) Intake/Output from previous day: 07/03 0701 - 07/04 0700 In: 1741 [P.O.:580; I.V.:50; Blood:661; IV Piggyback:450] Out: 1130 [Urine:1000; Drains:130] Intake/Output from this shift:    Labs:  Recent Labs  06/06/15 0425 06/07/15 0500 06/08/15 0141  WBC 7.6 7.8 10.9*  HGB 7.7* 7.2* 10.3*  PLT 203 226 219  CREATININE 1.07* 1.17* 1.27*   Estimated Creatinine Clearance: 45.9 mL/min (by C-G formula based on Cr of 1.27). No results for input(s): VANCOTROUGH, VANCOPEAK, VANCORANDOM, GENTTROUGH, GENTPEAK, GENTRANDOM, TOBRATROUGH, TOBRAPEAK, TOBRARND, AMIKACINPEAK, AMIKACINTROU, AMIKACIN in the last 72 hours.   Microbiology: Recent Results (from the past 720 hour(s))  Culture, Urine     Status: None   Collection Time: 05/18/15  8:38 AM  Result Value Ref Range Status   Colony Count 5,000 COLONIES/ML  Final   Organism ID, Bacteria Insignificant Growth  Final  Blood culture (routine x 2)     Status: None   Collection Time: 05/22/15 11:43 AM  Result Value Ref Range Status   Specimen Description BLOOD HAND RIGHT  Final   Special Requests BOTTLES DRAWN AEROBIC ONLY 3CC  Final   Culture NO GROWTH 5 DAYS  Final   Report Status 05/27/2015 FINAL  Final  Blood culture (routine x 2)     Status: None   Collection Time: 05/22/15 12:42 PM  Result Value Ref Range Status   Specimen Description BLOOD ARM LEFT  Final   Special Requests BOTTLES DRAWN AEROBIC AND ANAEROBIC 5CC  Final   Culture NO GROWTH 5 DAYS  Final   Report Status 05/27/2015 FINAL  Final  Urine culture     Status: None    Collection Time: 05/22/15  1:21 PM  Result Value Ref Range Status   Specimen Description URINE, CLEAN CATCH  Final   Special Requests NONE  Final   Culture   Final    MULTIPLE SPECIES PRESENT, SUGGEST RECOLLECTION IF CLINICALLY INDICATED   Report Status 05/23/2015 FINAL  Final  Blood culture (routine x 2)     Status: None   Collection Time: 05/22/15  5:05 PM  Result Value Ref Range Status   Specimen Description BLOOD LEFT ANTECUBITAL  Final   Special Requests BOTTLES DRAWN AEROBIC ONLY  8 CC  Final   Culture NO GROWTH 5 DAYS  Final   Report Status 05/27/2015 FINAL  Final  Surgical pcr screen     Status: Abnormal   Collection Time: 05/28/15  7:39 AM  Result Value Ref Range Status   MRSA, PCR NEGATIVE NEGATIVE Final   Staphylococcus aureus POSITIVE (A) NEGATIVE Final    Comment:        The Xpert SA Assay (FDA approved for NASAL specimens in patients over 82 years of age), is one component of a comprehensive surveillance program.  Test performance has been validated by [facility] for patients greater than or equal to 37 year old. It is not intended to diagnose infection nor to guide or monitor treatment.   Wound culture     Status: None (Preliminary result)   Collection Time: 06/05/15  9:41 AM  Result Value  Ref Range Status   Specimen Description WOUND ABDOMEN  Final   Special Requests Immunocompromised ABDOMINAL INCISIONAL FLUID SWAB   Final   Gram Stain   Final    NO WBC SEEN NO SQUAMOUS EPITHELIAL CELLS SEEN FEW GRAM NEGATIVE RODS FEW GRAM POSITIVE COCCI IN PAIRS RARE GRAM POSITIVE RODS    Culture   Final    Culture reincubated for better growth Performed at [facility]    Report Status PENDING  Incomplete    Anti-infectives    Start     Dose/Rate Route Frequency Ordered Stop   06/06/15 1030  piperacillin-tazobactam (ZOSYN) IVPB 3.375 g  Status:  Discontinued     3.375 g 12.5 mL/hr over 240 Minutes Intravenous Every 8 hours 06/06/15 1022 06/07/15 0759    06/05/15 0200  vancomycin (VANCOCIN) IVPB 750 mg/150 ml premix     750 mg 150 mL/hr over 60 Minutes Intravenous Every 12 hours 06/04/15 1230     06/04/15 1800  piperacillin-tazobactam (ZOSYN) IVPB 3.375 g     3.375 g 12.5 mL/hr over 240 Minutes Intravenous Every 8 hours 06/04/15 1230     06/04/15 1300  vancomycin (VANCOCIN) 1,500 mg in sodium chloride 0.9 % 500 mL IVPB     1,500 mg 250 mL/hr over 120 Minutes Intravenous  Once 06/04/15 1227 06/04/15 1823   06/04/15 1230  piperacillin-tazobactam (ZOSYN) IVPB 3.375 g     3.375 g 100 mL/hr over 30 Minutes Intravenous  Once 06/04/15 1226 06/04/15 1505   05/28/15 0930  cefOXitin (MEFOXIN) 2 g in dextrose 5 % 50 mL IVPB    Comments:  Pharmacy may adjust dosing strength, interval, or rate of medication as needed for optimal therapy for the patient Send with patient on call to the OR.  Anesthesia to complete antibiotic administration <37min prior to incision per [facility].   2 g 100 mL/hr over 30 Minutes Intravenous On call to O.R. 05/28/15 0919 05/28/15 0930   05/28/15 0600  cefOXitin (MEFOXIN) 2 g in dextrose 5 % 50 mL IVPB  Status:  Discontinued    Comments:  Pharmacy may adjust dosing strength, interval, or rate of medication as needed for optimal therapy for the patient Send with patient on call to the OR.  Anesthesia to complete antibiotic administration <80min prior to incision per [facility].   2 g 100 mL/hr over 30 Minutes Intravenous On call to O.R. 05/27/15 1320 05/27/15 1328      Assessment: Day # 5 of 7 Vancomycin/Zosyn started 6/30 b/c pain in lower part of incision.  200 mls expressed from wound, wound cx obtained note 6/30 but not sent to lab until 7/1. WBC up to 10.9 from 7.8.(IV solucortef changed to PO HC, also on megace). AF, creat 1.27 from 1.17 No significant wound dehiscence. Wound with foul odor. G-tube leaking.   6/30 Zosyn >> 6/30 Vancomycin >>   7/1 (obtained 6/30) wound cx>> few GNR, few GP cocci 6/17  BCx3>>NGF   Goal of Therapy:  Vancomycin trough level 15-20 mcg/ml  Plan:  -continue zosyn 3.375 gm IV q8h, 4 hr infusion -continue vancomycin 750 mg IV q12h - no vanc level planned b/c only 2 more days abx left and may DC home on PO abx soon Eudelia Bunch, Pharm.D. [phone] 06/08/2015 7:54 AM

## 2015-06-08 NOTE — Progress Notes (Signed)
Per MD order, PICC line removed. Cath intact at 37cm. Vaseline pressure gauze to site, pressure held x 5min. No bleeding to site. Pt instructed to keep dressing CDI x 24 hours. Avoid heavy lifting, pushing or pulling x 24 hours,  If bleeding occurs hold pressure, if bleeding does not stop contact MD or go to the ED. Pt does not have any questions. Jaymes Hang M  

## 2015-06-08 NOTE — Consult Note (Signed)
WOC follow up on the pouch placed around the Gtube for drainage control. The pouch is working well to contain the drainage and lessen the exposure to the peristomal skin.  She is happy with the outcome.  I will order some additional supplies pending her DC to home with [facility], per the daughter and patient that is a possibility soon.  Will continue to follow along and change pouch mid week if still inpatient.  [place], [place]

## 2015-06-08 NOTE — Care Management Note (Signed)
Case Management Note  Patient Details  Name: Ashley Hurley MRN: [id number] Date of Birth: [birth year]  Subjective/Objective:                    Action/Plan:  Spoke with Bethena Roys from [place] . She is unable to confirm today due to holiday if Alvis Lemmings is in network with patient's insurance . Explained to patient and [name] , they would like [place]. Santiago Glad with [facility]. Bedside nurse will give patient dressing supplies at discharge. Home Health will assist [name] in ordering more.   Called Jermain with [facility] for 3 in 1 and walker . [name] would like hospitral bed ordered in case in the near future patient needs one . [facility] [name] will call if needed .   Magdalen Spatz RN BSN [phone]   Expected Discharge Date:                  Expected Discharge Plan:  [place]  In-House Referral:     Discharge planning Services  CM Consult  Post Acute Care Choice:  Home Health, Durable Medical Equipment Choice offered to:  Patient, Spouse  DME Arranged:  3-N-1, Walker rolling, Hospital bed DME Agency:  [place]:  RN, PT [facility] Agency:  [place]  Status of Service:  In process, will continue to follow  Medicare Important Message Given:    Date Medicare IM Given:    Medicare IM give by:    Date Additional Medicare IM Given:    Additional Medicare Important Message give by:     If discussed at [place] of Stay Meetings, dates discussed:    Additional Comments:  Marilu Favre, RN 06/08/2015, 10:07 AM

## 2015-06-08 NOTE — Discharge Summary (Signed)
Physician Discharge Summary  Ashley Hurley RKY:[id number] DOB: Mar 20, 1951 DOA: 05/22/2015  PCP: Lottie Dawson, MD  Admit date: 05/22/2015 Discharge date: 06/08/2015  Time spent: Greater than 30 minutes  Recommendations for Outpatient Follow-up:  1. [facility]/General Surgery: Patient or family to call by Wednesday (06/10/2015) to check on final wound culture results and make antibiotic change if needed. 2. Dr. Stark Klein, [facility] in 2-3 weeks  3. Home Health RN, PT, 3 n 1, rolling walker & Hospital bed. RN to draw labs (CBC, CMP & Mg) on 06/11/2015 and results to be sent to PCP. RN to educate and assist with dressing changes as oulined by General surgery. 4. Dr. Shanon Ace, PCP in 1 week. 5. Dr. Dominica Severin B. Benay Spice, Oncology on 06/19/2015  Discharge Diagnoses:  Active Problems:   Liver mass, left lobe   Pancreatic mass   SBO (small bowel obstruction)   Abdominal pain   Protein-calorie malnutrition, severe   Liver mass   Nausea and vomiting   Arterial hypotension   Atelectasis   Hydroureter, right   Pulmonary edema   Discharge Condition: Improved & Stable  Diet recommendation: Heart Health diet.  Filed Weights   06/06/15 0548 06/07/15 0516 06/08/15 0542  Weight: 78.608 kg (173 lb 4.8 oz) 77.928 kg (171 lb 12.8 oz) 75.8 kg (167 lb 1.7 oz)    History of present illness:  64 y.o. F with hx breast CA s/p mastectomy, admitted 6/17 with N/V and abd pain. Found to have pancreatic mass, liver mass, SBO. Failed conservative management for SBO therefor taken to OR 6/23 for ex lap. Following surgery, she remained hypotensive; therefore, PCCM was consulted. Patient transferred to [facility] on 6/27.  Hospital Course:   Active Problems: SBO secondary to pancreatic mass/adenocarcinoma (encases splenic artery & Vein, Splenic Vein thrombosis & Hepatic Mets), s/p exp Lap 6/23 - s/p ex lap 6/23, with ileocolonic bypass (palliative), gastrostomy tube placement (Dr. Marlou Starks). - Pancreatic mass -  unresectable - ileus resolved. - G-tube clamped 6/29, staples removed 6/30 secondary to trapped infected seroma. Now complicated by large subcutaneous infected seroma-see below. G-tube leaking-WOC following, has ostomy pouch to manage and skin barrier. - Management per general surgery-follow-up appreciated - TPN and tube feeds discontinued. - Appetite improving. No N/V. Mild intermittent abdominal pain. Flatus++. No BM - trying suppository today. No significant leaking/draining around G tube site or from abdominal wound. Ambulating with PT with assistance. Ileus resolved. - Discussed with Surgical team and have cleared home for DC.  - R adnexal mass reported on CT & elevated CA 125 & CEA> ? Mets from pancreatic Ca Vs second cancer (Ovarian/GI)> defer to Oncology follow up OP.  Large subcutaneous infected Seroma/wound dehiscence - At site of exploratory laparotomy - Wound now open.  - Started IV vancomycin and Zosyn. - WOC input appreciated. - Follow wound culture results: Pseudomonas & Coli (sensitivities pending)> [facility] wrote for Cipro at DC and they will follow up final Cx results as OP and make needed Abx change. - Staples removed 6/30 secondary to trapped infected seroma under the incision, cellulitic changes, and slough/necrosis at the base of the wound - G tube also can serve palliative role as SBO may reoccur with progression of malignancy. - Wound improving.  Severe PCM - in the setting of the acute on chronic illness and pancreatic cancer - Dietitian following - Tolerating soft diet. Encourage supplements. She is taking some outside food with protein powder. Megace for appetite stimulation. Advised to eat every 12 hours or so to  maximize intake. - DC'd tube feeds. - Appetite improving.  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 Gearldine Shown brief note appreciated and discussed with him. He has arranged a follow-up appointment with him on 06/19/15.  - Not resectable.  Post op hypotension secondary to hypovolemia and relative adrenal insufficiency  - Resolved. Chronic soft blood pressures but asymptomatic of same. - Blood pressures at times in the 80s. Started hydrocortisone low-dose for suspected relative adrenal insufficiency. BP's improved.  Anemia of chronic disease, malignancy - no signs of active bleeding - Stable. Transfuse if hemoglobin <7 g per DL. - Transfused 1 unit of PRBC for hemoglobin of 7 on 6/30. - Hemoglobin had improved to 7.9 but then has gradually drifted down to 7.2 on 7/3. No reported bleeding. Draining in pouch around G-tube not bloody or melena. Will not check output for occult blood because it is expected to be trace positive. - S/P 2 units PRBCs and Hb now 10 g range. Close OP follow up.  Acute hypoxic respiratory failure - post op atelectasis and effusion at the right lung base, now resolved   Right hydroureteronephrosis  - secondary to a soft tissue mass in the right lower pelvis - Urologist recommend conservative management of asymptomatic hydronephrosis at this time.  - since Cr trending down, no stenting needed at this time   Acute renal failure - Secondary to right hydroureteronephrosis and prerenal etiology from dehydration in the setting of SBO - IV fluids provided, creatinine remains WNL  - IVF have been discontinued 6/27 due to volume overload  - Creatinine has gradually bumped up in last couple of days to 1.27 > may be related to Lasix- Dc'ed - Monitor creatinine periodically  Volume overload in the setting of severe hypoalbuminemia and post op hypotension requiring IVF - at baseline ~ 130 lbs but now up to 170's lbs - Will gradually get better with improving nutritional status - No significant diuresis with IV Lasix given on 6/28. Increased IV  Lasix 7/2. - Leg edema improved. - DC'ed Lasix  Hypokalemia - Replace as needed and follow  Leaking G-tube - WOC consultation and management per surgery. Improved after ostomy bag around G-tube.  Right lower extremity swelling - Lower extremity venous Doppler negative for DVT. Likely from asymmetric edema related to IV fluid resuscitation and hypoalbuminemia. - Increaseed IV Lasix. Improved.  Failure to thrive - Per surgery, patient unwilling to discuss cancer and would prefer not to talk about at all. Discussed at length with patients partner/significant other and daughter today. Advised them of patients very poor prognosis and high likelihood of rapid decline in short term. They verbalized understanding.    SIGNIFICANT EVENTS: 6/17 - admit. 6/20 - liver biopsy (pathology positive for adenocarcinoma - pancreatobiliary). 6/23 - ex lap with SB to left colon bypass (palliative), gastrostomy tube placement. 6/26 - transferred from ICU to medical floor  6/27 - Pt transferred from [facility] to [facility] care; on TPN 6/28 - Tolerating clear liquids, has had BM 6/29-G-tube clamped and starting soft diet  Consultants:  Surgery  Oncology  Procedures: 6/20 - liver biopsy (pathology positive for adenocarcinoma - pancreatobiliary). 6/23 - ex lap with SB to left colon bypass (palliative), gastrostomy tube placement. - RUE PICC 6/22 >>> DC'ed at DC - B/L LE Venous doppler 06/05/2015: Summary:  - No evidence of deep vein or superficial thrombosis involving  the right lower extremity and left lower extremity. - No evidence of Baker&'s cyst on the right or left. - Modeate edema noted throughout the right lower extremity.  NGT> DC'ed   Discharge Exam:  Complaints: Appetite improving. No N/V. Mild intermittent abdominal pain. Flatus++. No BM - trying suppository today. No significant leaking/draining around G tube site or from abdominal wound. Ambulating with PT with assistance.  Filed  Vitals:   06/07/15 1756 06/07/15 1815 06/07/15 2104 06/08/15 0542  BP: 82/58 87/55 100/69 91/64  Pulse: 82 78 73 84  Temp: 98.4 F (36.9 C) 99.3 F (37.4 C) 98.2 F (36.8 C) 98.3 F (36.8 C)  TempSrc: Oral Oral Oral Oral  Resp: 18 20 18 18   Height:      Weight:    75.8 kg (167 lb 1.7 oz)  SpO2: 97% 96% 95% 95%    General exam: Pleasant middle-aged female seen ambulating comfortably with RW with PT supervision. Respiratory system: clear to auscultation. No increased work of breathing. Cardiovascular system: S1 & S2 heard, RRR. No JVD, murmurs, gallops, clicks. Trace bilateral leg edema, right >left. Edema significantly improved. Gastrointestinal system: Abdomen is nondistended, soft and non tender. Dressing over surgical site and around G tube clean and dry. Urostomy pouch next to G-tube with small amount of brown drainage. Normal bowel sounds heard. Central nervous system: Alert and oriented. No focal neurological deficits. Extremities: Symmetric 5 x 5 power.  Discharge Instructions      Discharge Instructions    Call MD for:  difficulty breathing, headache or visual disturbances    Complete by:  As directed      Call MD for:  extreme fatigue    Complete by:  As directed      Call MD for:  hives    Complete by:  As directed      Call MD for:  persistant dizziness or light-headedness    Complete by:  As directed      Call MD for:  persistant nausea and vomiting    Complete by:  As directed      Call MD for:  redness, tenderness, or signs of infection (pain, swelling, redness, odor or green/yellow discharge around incision site)    Complete by:  As directed      Call MD for:  severe uncontrolled pain    Complete by:  As directed      Call MD for:  temperature >100.4    Complete by:  As directed      Change dressing    Complete by:  As directed   Will need [place] nursing for help with managing leaking around g-tube site with ostomy supplies, and midline wound. The G-tube has an  ileostomy bag around it, with a hole cut into it to allow g-tube to come out of the bag.  Ensure skin protective barrier around g-tube site.  Her midline wound is WD dressing changes TID until wound is clean enough for a wound vac to be placed.  She would be appropriate for a wound vac in 3-4 days when the wound is 80% clean with minimal slough.  Wound vac already arranged for when ready.     Diet - low sodium heart healthy    Complete by:  As directed      Discharge wound care:    Complete by:  As directed   As per [place].     Increase activity slowly    Complete by:  As directed  Medication List    STOP taking these medications        lisinopril-hydrochlorothiazide 10-12.5 MG per tablet  Commonly known as:  PRINZIDE,ZESTORETIC     niacin 750 MG CR tablet  Commonly known as:  NIASPAN     oxyCODONE-acetaminophen 5-325 MG per tablet  Commonly known as:  PERCOCET/ROXICET      TAKE these medications        bisacodyl 10 MG suppository  Commonly known as:  DULCOLAX  Place 1 suppository (10 mg total) rectally daily as needed for mild constipation or moderate constipation.     ciprofloxacin 500 MG tablet  Commonly known as:  CIPRO  Take 1 tablet (500 mg total) by mouth 2 (two) times daily.     docusate sodium 100 MG capsule  Commonly known as:  COLACE  Take 1 capsule (100 mg total) by mouth 2 (two) times daily.     escitalopram 20 MG tablet  Commonly known as:  LEXAPRO  TAKE 1 TABLET EVERY DAY     feeding supplement (ENSURE ENLIVE) Liqd  Take 237 mLs by mouth 2 (two) times daily between meals.     hydrocortisone 10 MG tablet  Commonly known as:  CORTEF  Take 1 tablet (10 mg total) by mouth daily.     magnesium oxide 400 MG tablet  Commonly known as:  MAG-OX  Take 1 tablet (400 mg total) by mouth 2 (two) times daily.     megestrol 40 MG/ML suspension  Commonly known as:  MEGACE  Take 5 mLs (200 mg total) by mouth 2 (two) times daily.      omeprazole 40 MG capsule  Commonly known as:  PRILOSEC  Take 1 capsule (40 mg total) by mouth daily.     oxyCODONE 5 MG immediate release tablet  Commonly known as:  Oxy IR/ROXICODONE  Take 1 tablet (5 mg total) by mouth every 6 (six) hours as needed for moderate pain, severe pain or breakthrough pain.       Follow-up Information    Call [facility] OFF[facility] [place].   Why:  By Marlana Latus to check culture sensitivities on bacteria from wound, this way we can verify you are on the right antibiotics.   Contact information:   [place] [address] [phone]      Schedule an appointment as soon as possible for a visit with Stark Klein, MD.   Specialty:  General Surgery   Why:  For post-operation/wound check in 2-3 weeks   Contact information:   [place] [address] [phone]       Follow up with [facility].   Why:  home health RN/PT and hospital bed    Contact information:   [address] [place] [address] [phone]       Follow up with Lottie Dawson, MD. Schedule an appointment as soon as possible for a visit in 1 week.   Specialties:  Internal Medicine, Pediatrics   Contact information:   [place] [address] [phone]       Follow up with Betsy Coder, MD On 06/19/2015.   Specialty:  Oncology   Why:  8:30 AM   Contact information:   [place]  [id number] [phone]        The results of significant diagnostics from this hospitalization (including imaging, microbiology, ancillary and laboratory) are listed below for reference.    Significant Diagnostic  Studies: Dg Abd 1 View  05/23/2015   CLINICAL DATA:  Follow-up small bowel obstruction. Subsequent encounter.  EXAM: ABDOMEN - 1 VIEW  COMPARISON:  Abdominal radiograph performed 05/22/2015  FINDINGS: Persistent dilated small bowel loops are seen, measuring  up to 4.9 cm in diameter, concerning for persistent small bowel obstruction. There are nondistended loops of small bowel interspersed with the distended loops, and some of this may reflect underlying ileitis as noted on recent CT, in conjunction with the patient's known abdominal masses.  Contrast is noted within the descending colon and rectum, as on the prior study. No free intra-abdominal air is identified, though evaluation for free air is limited on a single supine view. An enteric tube is noted ending overlying the body of the stomach. No acute osseous abnormalities are seen.  IMPRESSION: Persistent dilated small bowel loops, measuring up to 4.9 cm in diameter, concerning for persistent small bowel obstruction. Underlying nondistended loops small bowel are also seen. Some of this may reflect underlying ileitis as noted on recent CT, in conjunction with obstruction from the patient's known right lower quadrant mass.   Electronically Signed   By: Garald Balding M.D.   On: 05/23/2015 09:22   US Abdomen Complete  05/18/2015   CLINICAL DATA:  Nausea and vomiting, weight loss. Acute renal insufficiency.  EXAM: ULTRASOUND ABDOMEN COMPLETE  COMPARISON:  None.  FINDINGS: Gallbladder: Possible sludge and small gallstones are noted. Mild gallbladder wall thickening is noted measuring 6 mm. No pericholecystic fluid is noted. No sonographic Murphy's sign is noted.  Common bile duct: Diameter: 9 mm which is mildly dilated.  Liver: 2.7 cm complex abnormality seen in left hepatic lobe concerning for possible neoplasm.  IVC: Visualized portion appears normal.  Pancreas: Visualized portion unremarkable.  Spleen: Size and appearance within normal limits.  Right Kidney: Length: 9.7 cm. Echogenicity within normal limits. Moderate hydronephrosis is noted.  Left Kidney: Length: 11.1 cm. Echogenicity within normal limits. Mild left hydronephrosis is noted.  Abdominal aorta: No aneurysm visualized.  Other findings: Minimal ascites  is noted. Probable epigastric mass measuring 4.1 x 3.6 x 2.9 cm is noted.  IMPRESSION: Possible sludge in minimal cholelithiasis is noted with gallbladder wall thickening, but no pericholecystic fluid. Acute cholecystitis cannot be excluded, and HIDA scan may be performed for further evaluation.  2.7 cm complex abnormality seen in left hepatic lobe which may represent mass or neoplasm. Also noted is possible epigastric mass measuring 4.1 x 3.6 x 2.9 cm. CT scan of the abdomen and pelvis with intravenous contrast administration is recommended for further evaluation.  Minimal left hydronephrosis is noted with moderate right hydronephrosis. Further evaluation with CT scan is recommended.   Electronically Signed   By: Marijo Conception, M.D.   On: 05/18/2015 16:59   Ct Abdomen Pelvis W Contrast  05/19/2015   CLINICAL DATA:  Nausea and vomiting for 3 weeks. Abdominal mass. Acute renal failure. Liver mass and hydronephrosis seen on recent ultrasound.  EXAM: CT ABDOMEN AND PELVIS WITH CONTRAST  TECHNIQUE: Multidetector CT imaging of the abdomen and pelvis was performed using the standard protocol following bolus administration of intravenous contrast.  CONTRAST:  14mL OMNIPAQUE IOHEXOL 300 MG/ML  SOLN  COMPARISON:  None.  FINDINGS: Lower Chest: No acute findings.  Hepatobiliary: Poorly defined hypovascular mass is seen in the lateral segment left hepatic lobe measuring 3.2 x 3.5 cm on image 15 of series 2.  Another low-attenuation lesion is seen in the left hepatic lobe at the falciform ligament  measuring approximately 1.8 x 2.6 cm on image 20. This could be due to focal fatty infiltration although hepatic neoplasm cannot be excluded.  A 1 cm cyst is seen in the dome of the left hepatic lobe. Diffuse biliary ductal dilatation is demonstrated. No definite signs of acute cholecystitis.  Pancreas: An ill-defined hypovascular mass is seen which is centered in the body of the pancreas which measures 3.4 x 6.1 cm on image  22/series 2. Distal pancreatic atrophy noted. 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 There is no evidence of portal vein thrombus. There is no direct involvement of the celiac axis, superior mesenteric artery, or superior mesenteric vein.  Spleen: Within normal limits in size and appearance. Upper abdominal venous collaterals are seen likely due to splenic vein thrombosis.  Adrenals:  No masses identified.  Kidneys/Urinary Tract: No renal masses are identified. Moderate right hydroureteronephrosis is seen . There is a irregular soft tissue mass obstructing the pelvic portion of the right ureter, as described below. This mass is located in the right adnexal region adjacent to the right iliac vessels and measures approximately 3.4 x 2.8 cm on image 64/series 2. There is adjacent lymphadenopathy in the right lower quadrant mesentery.  Stomach/Bowel/Peritoneum: Distal small bowel obstruction is seen with transition point at site of mass in the right pelvis/adnexal region described below. Mild ascites seen in the perihepatic space and inferior pelvis.a  Vascular/Lymphatic: Mild lymphadenopathy in the porta hepatis portacaval space, measuring up to 15 mm. Right lower quadrant mesenteric lymphadenopathy is seen adjacent to the right adnexal mass, with largest lymph node measuring 10 mm on image 62/series 2.  Reproductive: Uterus is unremarkable. There is an irregular soft tissue mass in the right adnexa adjacent to the right iliac vessels measuring approximately 4 x 5 cm in maximum diameter on image 49/series 602. This mass obstructs the right ureter and distal small bowel loops in this region.  Other:  None.  Musculoskeletal:  No suspicious bone lesions identified.  IMPRESSION: 6 cm hypovascular mass centered in the pancreatic body, suspicious for primary pancreatic carcinoma. This mass shows encasement of the splenic artery and vein, with  splenic vein thrombosis. Consider EUS for tissue diagnosis.  Diffuse biliary ductal dilatation, likely secondary to mild porta hepatis lymphadenopathy/metastatic disease.  3.5 cm hypovascular mass in the lateral segment of the left hepatic lobe, suspicious for liver metastasis. Indeterminate left hepatic lobe lesion adjacent to the falciform ligament could represent focal fatty infiltration, although hepatic metastasis cannot be excluded.  Right hydroureteronephrosis and distal small bowel obstruction due to irregular soft tissue mass in the right adnexa approximately 4 x 5 cm. Adjacent mesenteric lymphadenopathy also seen. Differential diagnosis includes metastatic disease, or primary malignancies such as ovarian carcinoma, appendiceal carcinoma, or carcinoid tumor.  Mild ascites.   Electronically Signed   By: Earle Gell M.D.   On: 05/19/2015 14:22   US Renal  05/18/2015   CLINICAL DATA:  Acute renal insufficiency. Nausea vomiting. Weight loss.  EXAM: RENAL / URINARY TRACT ULTRASOUND COMPLETE  COMPARISON:  None.  FINDINGS: Right Kidney:  Length: 9.7 cm. Moderate right hydronephrosis. Right kidney is slightly echogenic suggesting associated medical renal disease. No focal renal mass.  Left Kidney:  Length: 11.1 cm. Minimal caliectasis noted over the lower pole calices. Left kidney is slightly echogenic suggesting the possibility associated chronic medical renal disease. No focal renal mass.  Bladder:  No bladder distention.  Mild  ascites.  IMPRESSION: 1. Moderate right hydronephrosis. Mild caliectasis left lower renal pole. No bladder distention.  2. Kidneys are slightly echogenic suggesting a component of chronic medical renal disease.  3. Mild ascites. Reference is made to abdominal ultrasound report for discussion of abdominal masses.   Electronically Signed   By: Marcello Moores  Register   On: 05/18/2015 16:56   US Biopsy  05/25/2015   INDICATION: 64 year old female with a new imaging diagnosis of pancreatic mass  and liver mass, referred for image guided biopsy.  EXAM: ULTRASOUND GUIDED RENAL BIOPSY  COMPARISON:  CT abdomen 05/19/2015  MEDICATIONS: Fentanyl 2.0 mcg IV; Versed 50 mg IV  ANESTHESIA/SEDATION: Total Moderate Sedation time  Twenty minutes  COMPLICATIONS: None  PROCEDURE: Informed written consent was obtained from the patient after a discussion of the risks, benefits and alternatives to treatment. The patient understands and consents the procedure. A timeout was performed prior to the initiation of the procedure.  Ultrasound survey of the epigastric region was performed, with images of the left liver lobe stored and sent to PACs.  The epigastric region was then prepped and draped in the usual sterile fashion.  Using sterile technique, the skin and subcutaneous tissues were generously infiltrated 1% lidocaine for local anesthesia. The musculature was also infiltrated with 1% lidocaine to the level of the liver capsule.  Using ultrasound, a guide needle was advanced into the heterogeneously echoic lesion of the left liver lobe. After we confirmed position of the needle tip with ultrasound, 4 separate 18 gauge core biopsy were retrieved.  Specimen were placed into formalin for transportation to the lab.  Gel-Foam pledgets were then infused with a small amount of saline.  The patient tolerated the procedure well and remained hemodynamically stable throughout.  No complications were encountered and no significant blood loss was encountered.  FINDINGS: Ultrasound survey and images during the case demonstrate heterogeneous lesion within the left liver lobe. Needle tip was observed within the lesion on each pass.  Gas was present on the final image after infusion of Gel-Foam pledgets, with no complicating features identified.  IMPRESSION: Status post ultrasound-guided biopsy of left liver lobe mass, with tissue specimen sent to pathology for complete histopathologic analysis.  Signed,  Dulcy Fanny. Earleen Newport, DO  [facility]  [facility]   Electronically Signed   By: Corrie Mckusick D.O.   On: 05/25/2015 16:27   Dg Chest Port 1 View  05/31/2015   CLINICAL DATA:  Pulmonary edema  EXAM: PORTABLE CHEST - 1 VIEW  COMPARISON:  05/29/2015; 05/22/2015  FINDINGS: Grossly unchanged cardiac silhouette and mediastinal contours. Stable position of support apparatus. Worsening right mid and lower lung heterogeneous/consolidative opacities. Left basilar/retrocardiac opacities are unchanged. No definite pleural effusion or evidence of edema. Unchanged bones.  IMPRESSION: Worsening right mid and lower lung opacities, worrisome for progression of infection.   Electronically Signed   By: Sandi Mariscal M.D.   On: 05/31/2015 07:57   Dg Chest Port 1 View  05/29/2015   CLINICAL DATA:  Atelectasis  EXAM: PORTABLE CHEST - 1 VIEW  COMPARISON:  05/22/2015  FINDINGS: Cardiac shadow is within normal limits. The inspiratory effort is poor when compared with the prior exam. Postsurgical changes are new again noted on the right. Minimal right basilar atelectasis is noted. No focal confluent infiltrate is seen.  IMPRESSION: Minimal right basilar atelectasis.   Electronically Signed   By: Inez Catalina M.D.   On: 05/29/2015 08:17   Dg Abd 2 Views  05/26/2015   CLINICAL DATA:  History of small bowel obstruction, NG tube placement  EXAM: ABDOMEN - 2 VIEW  COMPARISON:  Abdomen films of 05/24/2015  FINDINGS: There are persistently dilated small bowel loops most consistent with partial small bowel obstruction with scattered air-fluid levels. No significant colonic bowel gas is seen with a small amount contrast within the nondistended descending and rectosigmoid colon. No free air is seen on the erect view. An NG tube is present with the tip coiling in the antrum of the stomach.  IMPRESSION: 1. Persistently dilated loops of small bowel consistent with partial small bowel obstruction. 2. NG tube coils in the antrum of the  stomach.   Electronically Signed   By: Ivar Drape M.D.   On: 05/26/2015 07:58   Dg Abd 2 Views  05/24/2015   CLINICAL DATA:  Small bowel obstruction  EXAM: ABDOMEN - 2 VIEW  COMPARISON:  05/23/2015  FINDINGS: Retained contrast within nondilated colon. Decrease in caliber of mildly dilated right lower quadrant loop of small bowel measuring 3.5 cm maximally. Clips project over the left abdomen. No acute osseous abnormality.  IMPRESSION: Mild decrease in gaseous dilatation of small bowel loops which may indicate improving small bowel obstruction.   Electronically Signed   By: Conchita Paris M.D.   On: 05/24/2015 09:56   Dg Abd Acute W/chest  05/22/2015   CLINICAL DATA:  Acute lower abdominal pain.  EXAM: DG ABDOMEN ACUTE W/ 1V CHEST  COMPARISON:  CT scan of May 19, 2015.  FINDINGS: Dilated small bowel loops are noted concerning for distal small bowel obstruction. Residual contrast is seen in nondilated distal colon and rectum. Heart size and mediastinal contours are within normal limits. Both lungs are clear.  IMPRESSION: Dilated small bowel loops are noted consistent with distal small bowel obstruction.   Electronically Signed   By: Marijo Conception, M.D.   On: 05/22/2015 14:54   Dg Abd Portable 1v  05/27/2015   CLINICAL DATA:  Nausea and vomiting.  EXAM: PORTABLE ABDOMEN - 1 VIEW  COMPARISON:  May 26, 2015.  FINDINGS: Nasogastric tube noted on prior exam has been removed. Residual contrast remains in the left colon and rectum. Dilated small bowel loops are noted in the left upper quadrant and right lower quadrant which appear to be increased compared to prior exam concerning for small bowel obstruction. Surgical clips are noted in the left upper quadrant as well. Phleboliths are seen in the pelvis.  IMPRESSION: Dilated loops of small bowel are noted which are more prominent compared to prior exam, concerning for small bowel obstruction.   Electronically Signed   By: Marijo Conception, M.D.   On: 05/27/2015  09:41   Dg Loyce Dys Tube Plc W/fl W/rad  05/22/2015   CLINICAL DATA:  Nasogastric tube placement requested  EXAM: NASO G TUBE PLACEMENT WITH FL AND WITH RAD  CONTRAST:  5 cc Omnipaque injected per NG tube  FLUOROSCOPY TIME:  Fluoroscopy Time (in minutes and seconds): 1 minutes 6 seconds  COMPARISON:  No similar prior exam is available at this institution for comparison or on [facility].  FINDINGS: Under fluoroscopic guidance, a 62 French nasogastric tube was inserted via the left nare, with visualization of termination of the tip over the left upper quadrant. 5 cc injected Omnipaque contrast verified intragastric positioning of the nasogastric tube. Return of clear yellow gastric content per NG tube was also visualized. The nasogastric tube was secured with tape and the patient returned to  the nursing unit in stable position.  IMPRESSION: Fluoroscopically guided nasogastric tube placement with tip in the stomach.   Electronically Signed   By: Conchita Paris M.D.   On: 05/22/2015 18:52    Microbiology: Recent Results (from the past 240 hour(s))  Wound culture     Status: None (Preliminary result)   Collection Time: 06/05/15  9:41 AM  Result Value Ref Range Status   Specimen Description WOUND ABDOMEN  Final   Special Requests Immunocompromised ABDOMINAL INCISIONAL FLUID SWAB   Final   Gram Stain   Final    NO WBC SEEN NO SQUAMOUS EPITHELIAL CELLS SEEN FEW GRAM NEGATIVE RODS FEW GRAM POSITIVE COCCI IN PAIRS RARE GRAM POSITIVE RODS    Culture   Final    ABUNDANT PSEUDOMONAS AERUGINOSA ABUNDANT ESCHERICHIA COLI Performed at [facility]    Report Status PENDING  Incomplete     Labs: Basic Metabolic Panel:  Recent Labs Lab 06/02/15 0640 06/03/15 0440 06/04/15 0510 06/05/15 0417 06/06/15 0425 06/07/15 0500 06/08/15 0141  NA 134* 137 133* 133* 133* 136 135  K 4.2 3.4* 3.2* 3.6 3.3* 3.0* 3.4*  CL 108 104 104 101 101 101 102  CO2 22 24 25 22 24 28 26   GLUCOSE 205* 163* 178*  145* 137* 139* 136*  BUN 27* 31* 33* 27* 21* 15 13  CREATININE 0.98 1.11* 1.11* 1.15* 1.07* 1.17* 1.27*  CALCIUM 7.5* 7.4* 7.4* 7.2* 7.2* 7.1* 7.1*  MG 2.0 1.7 1.7  --   --  1.6*  --   PHOS 3.8  --  3.3  --   --   --   --    Liver Function Tests:  Recent Labs Lab 06/03/15 0440 06/04/15 0510  AST 27 25  ALT 44 40  ALKPHOS 210* 162*  BILITOT 2.3* 3.3*  PROT 4.0* 4.1*  ALBUMIN 1.2* 1.2*   No results for input(s): LIPASE, AMYLASE in the last 168 hours. No results for input(s): AMMONIA in the last 168 hours. CBC:  Recent Labs Lab 06/04/15 1535 06/05/15 0417 06/06/15 0425 06/07/15 0500 06/08/15 0141  WBC 10.5 9.2 7.6 7.8 10.9*  NEUTROABS  --   --   --   --  8.9*  HGB 7.9* 8.0* 7.7* 7.2* 10.3*  HCT 22.3* 22.6* 21.3* 20.1* 29.1*  MCV 85.4 85.6 87.3 85.2 84.3  PLT 208 229 203 226 219   Cardiac Enzymes: No results for input(s): CKTOTAL, CKMB, CKMBINDEX, TROPONINI in the last 168 hours. BNP: BNP (last 3 results) No results for input(s): BNP in the last 8760 hours.  ProBNP (last 3 results) No results for input(s): PROBNP in the last 8760 hours.  CBG:  Recent Labs Lab 06/04/15 1939 06/04/15 2303 06/05/15 0333 06/05/15 0734 06/05/15 1155  GLUCAP 163* 137* 148* 134* 145*     Additional labs: 1. Anemia Panel: Iron 11, TIBC 190, Saturation ratios 6, Ferritin 138, Folate 20.6, B12: 3054, retics 35.8 2. INR 1.32 3. Cortisol 11.3 4. A1C 6.4 5. CA 19-9: 4102, 4179 6. CA 125: 354.5 7. CEA 22.2 8. Surgical Pathology 05/25/2015:  Diagnosis Liver, needle/core biopsy, left lobe - ADENOCARCINOMA. - LYMPHOVASCULAR INVASION IS IDENTIFIED. - PERINEURAL INVASION IS IDENTIFIED. - SEE COMMENT. 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 Dr. Vicente Males has reviewed the case and concurs with this interpretation. (JBK:gt, 05/26/15)   Signed:  Vernell Leep, MD,  FACP, FHM. [facility] Pager [phone]  If 7PM-7AM, please contact  night-coverage [email or web address] Password [email or web address] 06/08/2015, 12:17 PM

## 2015-06-08 NOTE — Progress Notes (Signed)
[facility] Progress Note  11 Days Post-Op  Subjective: Pt doing great.  Dressing changes going well, pain well controlled.  Appetite improving.  No BM in a few days, resume bowel regimen.  Wound cleaning up nicely.    Objective: Vital signs in last 24 hours: Temp:  [98.2 F (36.8 C)-100.2 F (37.9 C)] 98.3 F (36.8 C) (07/04 0542) Pulse Rate:  [73-90] 84 (07/04 0542) Resp:  [18-20] 18 (07/04 0542) BP: (80-100)/(44-69) 91/64 mmHg (07/04 0542) SpO2:  [95 %-97 %] 95 % (07/04 0542) Weight:  [75.8 kg (167 lb 1.7 oz)] 75.8 kg (167 lb 1.7 oz) (07/04 0542) Last BM Date: 06/03/15  Intake/Output from previous day: 07/03 0701 - 07/04 0700 In: 1741 [P.O.:580; I.V.:50; Blood:661; IV Piggyback:450] Out: 1130 [Urine:1000; Drains:130] Intake/Output this shift:    PE: Gen:  Alert, NAD, pleasant Abd: Soft, not very tender, evidence of anterior fascial necrosis, +BS, no HSM, midline wound with much less yellow slough and minimal foul odor, no significant wound dehiscence. Good granulation tissue formation now at base of wound. G-tube in place with ostomy pouch with tan enteric contents leaking around the g-tube.  Lab Results:   Recent Labs  06/07/15 0500 06/08/15 0141  WBC 7.8 10.9*  HGB 7.2* 10.3*  HCT 20.1* 29.1*  PLT 226 219   BMET  Recent Labs  06/07/15 0500 06/08/15 0141  NA 136 135  K 3.0* 3.4*  CL 101 102  CO2 28 26  GLUCOSE 139* 136*  BUN 15 13  CREATININE 1.17* 1.27*  CALCIUM 7.1* 7.1*   PT/INR No results for input(s): LABPROT, INR in the last 72 hours. CMP     Component Value Date/Time   NA 135 06/08/2015 0141   K 3.4* 06/08/2015 0141   CL 102 06/08/2015 0141   CO2 26 06/08/2015 0141   GLUCOSE 136* 06/08/2015 0141   BUN 13 06/08/2015 0141   CREATININE 1.27* 06/08/2015 0141   CALCIUM 7.1* 06/08/2015 0141   PROT 4.1* 06/04/2015 0510   ALBUMIN 1.2* 06/04/2015 0510   AST 25 06/04/2015 0510   ALT 40 06/04/2015 0510   ALKPHOS 162* 06/04/2015  0510   BILITOT 3.3* 06/04/2015 0510   GFRNONAA 43* 06/08/2015 0141   GFRAA 50* 06/08/2015 0141   Lipase     Component Value Date/Time   LIPASE 57* 05/22/2015 1153       Studies/Results: No results found.  Anti-infectives: Anti-infectives    Start     Dose/Rate Route Frequency Ordered Stop   06/06/15 1030  piperacillin-tazobactam (ZOSYN) IVPB 3.375 g  Status:  Discontinued     3.375 g 12.5 mL/hr over 240 Minutes Intravenous Every 8 hours 06/06/15 1022 06/07/15 0759   06/05/15 0200  vancomycin (VANCOCIN) IVPB 750 mg/150 ml premix     750 mg 150 mL/hr over 60 Minutes Intravenous Every 12 hours 06/04/15 1230     06/04/15 1800  piperacillin-tazobactam (ZOSYN) IVPB 3.375 g     3.375 g 12.5 mL/hr over 240 Minutes Intravenous Every 8 hours 06/04/15 1230     06/04/15 1300  vancomycin (VANCOCIN) 1,500 mg in sodium chloride 0.9 % 500 mL IVPB     1,500 mg 250 mL/hr over 120 Minutes Intravenous  Once 06/04/15 1227 06/04/15 1823   06/04/15 1230  piperacillin-tazobactam (ZOSYN) IVPB 3.375 g     3.375 g 100 mL/hr over 30 Minutes Intravenous  Once 06/04/15 1226 06/04/15 1505   05/28/15 0930  cefOXitin (MEFOXIN) 2 g in dextrose 5 % 50 mL  IVPB    Comments:  Pharmacy may adjust dosing strength, interval, or rate of medication as needed for optimal therapy for the patient Send with patient on call to the OR.  Anesthesia to complete antibiotic administration <47min prior to incision per [facility].   2 g 100 mL/hr over 30 Minutes Intravenous On call to O.R. 05/28/15 0919 05/28/15 0930   05/28/15 0600  cefOXitin (MEFOXIN) 2 g in dextrose 5 % 50 mL IVPB  Status:  Discontinued    Comments:  Pharmacy may adjust dosing strength, interval, or rate of medication as needed for optimal therapy for the patient Send with patient on call to the OR.  Anesthesia to complete antibiotic administration <26min prior to incision per [facility].   2 g 100 mL/hr over 30 Minutes Intravenous On call to O.R.  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  -Unresectable  SBO secondary to above POD #11 s/p ex lap, ileocolonic bypass, G-tube placement - leaking G tube.  -Ileus resolved. -Encouraged ambulation and IS.  -Pain control, antiemetics -Continue G-tube clamp, g-tube leaking -WOC following, has ostomy pouch to manage and skin barrier. -Staples removed 6/30 secondary to trapped infected seroma under the incision, cellulitic changes, and slough/necrosis at the base of the wound -G tube also can serve palliative role as SBO may reoccur with progression of malignancy.   Infected seroma -Culture shows gram neg rods, gram positive, cocci in pairs gram positive rods, Cont vanc/zosyn Day #6 -Await culture sensitivities.   Wound dehiscence -Abdominal binder for comfort when ambulating -[place] straps, continue TID WD dressing changes -Not yet ready for wound vac, maybe in 3-4 days -Wound cleaning up nicely  Severe protein calorie malnutrition.  -Dietitian following, tolerating soft diet, encourage supplements -She is taking in some outside food (smoothies/drinks) with protein powder. Add megace for appetite stimulation. Discussed role for eating every 2 hours or so to maximize intake.   Multiple medical problems -- per primary service  Disp:  Home when blood counts stabilized. ?Today with close follow up?  Would d/c with total 7 days antibiotics (augmentin) depending on sensitivities. Will need [facility] nursing for help with managing leaking around g-tube site with ostomy supplies, and midline wound.  She would be appropriate for a wound vac in 3-4 days when the wound is 80% clean with minimal slough.  Wound vac is already approved for after discharge.    LOS: 17 days    Nat Christen 06/08/2015, 7:36 AM Pager: [phone]

## 2015-06-08 NOTE — Progress Notes (Signed)
Physical Therapy Treatment Patient Details Name: Ashley Hurley MRN: [id number] DOB: [birth year] Today's Date: 06/08/2015    History of Present Illness 64 y.o. F with hx breast CA s/p mastectomy, admitted 6/17 with N/V and abd pain. Found to have pancreatic mass, liver mass, LAD, SBO. Failed conservative management for SBO therefor taken to OR 6/23 for ex lap.    PT Comments    Pt ambulated 350 ft in hallway this session w/ VCs to keep RW closer to avoid trunk flexion.  Discussed use of abdominal binder at home and pt will practice stairs w/ HHPT.  Pt w/ dec endurance and will benefit from continued skilled PT services to increase functional independence and safety.   Follow Up Recommendations  Home health PT;Supervision/Assistance - 24 hour     Equipment Recommendations  Rolling walker with 5" wheels;3in1 (PT)    Recommendations for Other Services       Precautions / Restrictions Precautions Precautions: Fall Required Braces or Orthoses: Other Brace/Splint Other Brace/Splint: abdominal binder Restrictions Weight Bearing Restrictions: No    Mobility  Bed Mobility Overal bed mobility: Modified Independent Bed Mobility: Supine to Sit     Supine to sit: Modified independent (Device/Increase time)     General bed mobility comments: Inc time, did not use rail, HOB slightly elevated.    Transfers Overall transfer level: Needs assistance Equipment used: Rolling walker (2 wheeled) Transfers: Sit to/from Stand Sit to Stand: Supervision         General transfer comment: Supervision for safety.  Ambulation/Gait Ambulation/Gait assistance: Supervision Ambulation Distance (Feet): 350 Feet Assistive device: Rolling walker (2 wheeled) Gait Pattern/deviations: Step-through pattern;Antalgic;Trunk flexed Gait velocity: decreased Gait velocity interpretation: Below normal speed for age/gender General Gait Details: VCs to keep RW closer to her so she does not flex her trunk,  pt able to demonstrate.   Stairs            Wheelchair Mobility    Modified Rankin (Stroke Patients Only)       Balance Overall balance assessment: Needs assistance Sitting-balance support: No upper extremity supported;Feet supported Sitting balance-Leahy Scale: Good     Standing balance support: Bilateral upper extremity supported;During functional activity Standing balance-Leahy Scale: Fair                      Cognition Arousal/Alertness: Awake/alert Behavior During Therapy: WFL for tasks assessed/performed Overall Cognitive Status: Within Functional Limits for tasks assessed                      Exercises General Exercises - Lower Extremity Ankle Circles/Pumps: AROM;10 reps;Both;Supine Heel Slides: AROM;Both;10 reps;Supine    General Comments General comments (skin integrity, edema, etc.): Discussed w/ pt about using abdominal binder when walking at home and to try to get up and ambulate frequently to maintain strength.  Pt is able to get into home w/o steps upon d/c and will practice steps w/ HHPT.      Pertinent Vitals/Pain Pain Assessment: 0-10 Pain Score: 2  Pain Location: low back Pain Descriptors / Indicators: Aching Pain Intervention(s): Limited activity within patient's tolerance;Monitored during session;Repositioned    Home Living                      Prior Function            PT Goals (current goals can now be found in the care plan section) Acute Rehab PT Goals Patient Stated Goal: to get back  to walking and golfing PT Goal Formulation: With patient/family Time For Goal Achievement: 06/17/15 Potential to Achieve Goals: Good Progress towards PT goals: Progressing toward goals    Frequency  Min 3X/week    PT Plan Current plan remains appropriate    Co-evaluation             End of Session Equipment Utilized During Treatment: Gait belt Activity Tolerance: Patient tolerated treatment well;Patient limited  by fatigue Patient left: in chair;with call bell/phone within reach;with family/visitor present     Time: 0934-1000 PT Time Calculation (min) (ACUTE ONLY): 26 min  Charges:  $Gait Training: 8-22 mins $Therapeutic Exercise: 8-22 mins                    G Codes:      Joslyn Hy PT, DPT [phone] Pager: [phone] 06/08/2015, 11:41 AM

## 2015-06-08 NOTE — Progress Notes (Signed)
Incision measurement 21.5cmX6cmX3.5cm

## 2015-06-08 NOTE — Discharge Instructions (Addendum)
Will need [place] nursing for help with managing leaking around g-tube site with ostomy supplies, and midline wound. The G-tube has an ileostomy bag around it, with a hole cut into it to allow g-tube to come out of the bag. Ensure skin protective barrier around g-tube site. Her midline wound is WD dressing changes TID until wound is clean enough for a wound vac to be placed. She would be appropriate for a wound vac in 3-4 days when the wound is 80% clean with minimal slough. Wound vac already arranged for when ready.  [facility], [place] [phone]  OPEN ABDOMINAL SURGERY: POST OP INSTRUCTIONS  Always review your discharge instruction sheet given to you by the facility where your surgery was performed.  IF YOU HAVE DISABILITY OR FAMILY LEAVE FORMS, YOU MUST BRING THEM TO THE OFFICE FOR PROCESSING.  PLEASE DO NOT GIVE THEM TO YOUR DOCTOR.  1. A prescription for pain medication may be given to you upon discharge.  Take your pain medication as prescribed, if needed.  If narcotic pain medicine is not needed, then you may take acetaminophen (Tylenol) or ibuprofen (Advil) as needed. 2. Take your usually prescribed medications unless otherwise directed. 3. If you need a refill on your pain medication, please contact your pharmacy. They will contact our office to request authorization.  Prescriptions will not be filled after 5pm or on week-ends. 4. You should follow a light diet the first few days after arrival home, such as soup and crackers, pudding, etc.unless your doctor has advised otherwise. A high-fiber, low fat diet can be resumed as tolerated.   Be sure to include lots of fluids daily. Most patients will experience some swelling and bruising on the chest and neck area.  Ice packs will help.  Swelling and bruising can take several days to resolve 5. Most patients will experience some swelling and bruising in the area of the incision. Ice pack will help. Swelling and bruising can  take several days to resolve..  6. It is common to experience some constipation if taking pain medication after surgery.  Increasing fluid intake and taking a stool softener will usually help or prevent this problem from occurring.  A mild laxative (Milk of Magnesia or Miralax) should be taken according to package directions if there are no bowel movements after 48 hours. 7.  You may have steri-strips (small skin tapes) in place directly over the incision.  These strips should be left on the skin for 7-10 days.  If your surgeon used skin glue on the incision, you may shower in 24 hours.  The glue will flake off over the next 2-3 weeks.  Any sutures or staples will be removed at the office during your follow-up visit. You may find that a light gauze bandage over your incision may keep your staples from being rubbed or pulled. You may shower and replace the bandage daily. 8. ACTIVITIES:  You may resume regular (light) daily activities beginning the next day--such as daily self-care, walking, climbing stairs--gradually increasing activities as tolerated.  You may have sexual intercourse when it is comfortable.  Refrain from any heavy lifting or straining until approved by your doctor. a. You may drive when you no longer are taking prescription pain medication, you can comfortably wear a seatbelt, and you can safely maneuver your car and apply brakes b. Return to Work: ___________________________________ 21. You should see your doctor in the office for a follow-up appointment approximately two weeks after  your surgery.  Make sure that you call for this appointment within a day or two after you arrive home to insure a convenient appointment time. OTHER INSTRUCTIONS:  _____________________________________________________________ _____________________________________________________________  WHEN TO CALL YOUR DOCTOR: 1. Fever over 101.0 2. Inability to urinate 3. Nausea and/or vomiting 4. Extreme swelling or  bruising 5. Continued bleeding from incision. 6. Increased pain, redness, or drainage from the incision. 7. Difficulty swallowing or breathing 8. Muscle cramping or spasms. 9. Numbness or tingling in hands or feet or around lips.  The clinic staff is available to answer your questions during regular business hours.  Please dont hesitate to call and ask to speak to one of the nurses if you have concerns.  For further questions, please visit [email or web address]  Small Bowel Obstruction A small bowel obstruction is a blockage (obstruction) of the small intestine (small bowel). The small bowel is a long, slender tube that connects the stomach to the colon. Its job is to absorb nutrients from the fluids and foods you consume into the bloodstream.  CAUSES  There are many causes of intestinal blockage. The most common ones include: Hernias. This is a more common cause in children than adults. Inflammatory bowel disease (enteritis and colitis). Twisting of the bowel (volvulus). Tumors. Scar tissue (adhesions) from previous surgery or radiation treatment. Recent surgery. This may cause an acute small bowel obstruction called an ileus. SYMPTOMS  Abdominal pain. This may be dull cramps or sharp pain. It may occur in one area or may be present in the entire abdomen. Pain can range from mild to severe, depending on the degree of obstruction. Nausea and vomiting. Vomit may be greenish or yellow bile color. Distended or swollen stomach. Abdominal bloating is a common symptom. Constipation. Lack of passing gas. Frequent belching. Diarrhea. This may occur if runny stool is able to leak around the obstruction. DIAGNOSIS  Your caregiver can usually diagnose small bowel obstruction by taking a history, doing a physical exam, and taking X-rays. If the cause is unclear, a CT scan (computerized tomography) of your abdomen and pelvis may be needed. TREATMENT  Treatment of the blockage depends on  the cause and how bad the problem is.  Sometimes, the obstruction improves with bed rest and intravenous (IV) fluids. Resting the bowel is very important. This means following a simple diet. Sometimes, a clear liquid diet may be required for several days. Sometimes, a small tube (nasogastric tube) is placed into the stomach to decompress the bowel. When the bowel is blocked, it usually swells up like a balloon filled with air and fluids. Decompression means that the air and fluids are removed by suction through that tube. This can help with pain, discomfort, and nausea. It can also help the obstruction resolve faster. Surgery may be required if other treatments do not work. Bowel obstruction from a hernia may require early surgery and can be an emergency procedure. Adhesions that cause frequent or severe obstructions may also require surgery. HOME CARE INSTRUCTIONS If your bowel obstruction is only partial or incomplete, you may be allowed to go home. Get plenty of rest. Follow your diet as directed by your caregiver. Only consume clear liquids until your condition improves. Avoid solid foods as instructed. SEEK IMMEDIATE MEDICAL CARE IF: You have increased pain or cramping. You vomit blood. You have uncontrolled vomiting or nausea. You cannot drink fluids due to vomiting or pain. You develop confusion. You begin feeling very dry or thirsty (dehydrated). You have severe bloating. You have chills.  You have a fever. You feel extremely weak or you faint. MAKE SURE YOU: Understand these instructions. Will watch your condition. Will get help right away if you are not doing well or get worse. Document Released: 02/07/2006 Document Revised: 02/13/2012 Document Reviewed: 02/04/2011 [facility] Patient Information 2015 [place], [place]. This information is not intended to replace advice given to you by your health care provider. Make sure you discuss any questions you have with your health care  provider.   Pancreatic Cancer  Pancreatic cancer is an abnormal growth of tissue (tumor) in the pancreas that is cancerous (malignant). Unlike noncancerous (benign) tumors, malignant tumors can spread to other parts of your body. The pancreas is a gland located deep in the abdomen, between the stomach and the spine. The pancreas makes insulin and other hormones. These hormones help the body use or store the energy that comes from food. The pancreas also makes pancreatic juices. These juices contain enzymes that help digest food. Most pancreatic cancers begin in the ducts that carry pancreatic juices. When cancer of the pancreas spreads (metastasizes) outside the pancreas, cancer cells are often found in nearby lymph nodes. If the cancer has reached these nodes, it means that cancer cells may have spread to other lymph nodes or other tissues, such as the liver or lungs. Sometimes cancer of the pancreas spreads to the peritoneum. This is the tissue that lines the abdomen. CAUSES  The exact cause of pancreatic cancer is unknown.  RISK FACTORS There are a number of risk factors that can increase your chances of getting pancreatic cancer. They include:  Age. The likelihood of developing pancreatic cancer increases with age. Most pancreatic cancers occur in people older than 60 years.  Smoking. Cigarette smokers are 2 to 3 times more likely than nonsmokers to develop pancreatic cancer.  Diabetes, especially if you were diagnosed as an adult.  Being female.  Being African American.  Family history. The risk for developing pancreatic cancer triples if a person's mother, father, sister, or brother had the disease. Also, a family history of colon cancer or ovarian cancer increases the risk of pancreatic cancer.  Chronic pancreatitis. Chronic pancreatitis is a painful condition of the pancreas.  Exposure to certain chemicals in the workplace.  Being obese or eating a diet high in fat and red  meat. SYMPTOMS  Pancreatic cancer is sometimes called a "silent disease." This is because early pancreatic cancer often does not cause symptoms. As the cancer grows, symptoms may include:  Weakness.  Abdominal pain.  Diarrhea.  Depression.  Loss of appetite.  Indigestion.  Pain in the upper abdomen or upper back.  Nausea and vomiting.  Yellowing of the skin or eyes (jaundice).  Back pain.  Weight loss.  Fatigue.  Clay-colored stools.  Unexplained blood clots.  Dark urine. DIAGNOSIS  Your caregiver will ask about your medical history. He or she may also perform a number of procedures, such as:  A physical exam. Your skin and eyes will be examined for signs of jaundice. The abdomen will be checked for changes in the area near the pancreas, liver, and gallbladder. Your caregiver will also check for an abnormal buildup of fluid in the abdomen (ascites).  Lab tests. Your caregiver may take blood and urine samples to check for bilirubin and other substances. Bilirubin is a substance that passes from the liver to the intestine through the gallbladder and bile duct. If the bile duct is blocked by a tumor, the bilirubin cannot pass through normally. Blockage  of the bile duct may cause the level of bilirubin in the blood and urine to become very high.  Computed tomography (CT). An X-ray machine linked to a computer takes a series of detailed pictures of the pancreas and other organs and blood vessels in the abdomen.  Ultrasonography. The ultrasound device uses sound waves to produce pictures of the pancreas and other organs inside the abdomen.  Endoscopic retrograde cholangiopancreatography. A lighted tube (endoscope) is passed through the patient's mouth and stomach, down into the small intestine. Then a smaller tube (catheter) is inserted through the endoscope into the bile ducts and pancreatic ducts. A dye is injected through the catheter into the ducts and X-rays are taken. The  X-rays can show whether the ducts are narrowed or blocked by a tumor.  Endoscopic ultrasonography. An endoscope is passed through the patient's mouth and stomach, down into the small intestine. An ultrasound device is placed down the endoscope to produce pictures of the area, including the pancreas.  Percutaneous transhepatic cholangiography. A dye is injected through a thin needle into the liver and X-rays are taken. Unless there is a blockage, the dye should move freely through the bile ducts. From the X-rays, your caregiver can tell whether there is a blockage from a tumor.  Taking a tissue sample (biopsy) from the pancreas. The sample is examined under a microscope to look for cancer cells. Your cancer will be staged according to its severity and extent. Staging is a careful attempt to categorize your cancer to help determine which treatment will be most appropriate. Factors involved in staging include the size of the tumor, whether the cancer has spread, and if so, to what parts of the body it has spread. You may need to have more tests to determine the stage of your cancer. The test results will help determine what treatment plan is best for you. STAGES   Stage I. The cancer is only found in the pancreas.  Stage II. The cancer has spread to nearby tissues and possibly to the lymph nodes, but not to the blood vessels.  Stage III. The cancer is surrounding the major blood vessels beside the pancreas and has possibly spread to the lymph nodes.  Stage IV. The cancer has spread to other parts of the body, such as the liver, lungs, or peritoneum. TREATMENT  Treatment generally begins within several weeks after the diagnosis. There will be time to talk with your caregiver about treatment choices, get a second opinion, and learn more about the disease. Your caregiver may refer you to a cancer specialist (oncologist).  Cancer of the pancreas is very hard to control with current treatments. For that  reason, many caregivers encourage patients with this disease to consider taking part in a clinical trial. Clinical trials are an important option for people with all stages of pancreatic cancer.  At this time, pancreatic cancer can be cured only when it is found at an early stage, before it has spread. However, other treatments may be able to control the disease and help patients live longer and feel better. When a cure or control of the disease is not possible, some patients choose palliative therapy. Palliative therapy aims to improve quality of life by controlling pain and other problems caused by this disease, but it does not cure the disease. Depending on the type and stage, pancreatic cancer may be treated with surgery, radiation therapy, or chemotherapy. Some patients have a combination of these therapies.  Surgery may be done to  remove all or part of the pancreas. Sometimes the cancer cannot be completely removed. However, if the tumor is blocking the common bile duct or duodenum, the surgeon can place a mesh tube (stent) in the blocked area. This helps keep the duct or duodenum open.  Radiation therapy uses high-energy rays to kill cancer cells.  Chemotherapy is the use of drugs to kill cancer cells. Caregivers also give chemotherapy to help reduce pain and other problems caused by pancreatic cancer. HOME CARE INSTRUCTIONS   Only take over-the-counter or prescription medicines for pain, discomfort, or fever as directed by your caregiver.  Maintain a healthy diet.  Consider joining a support group. This may help you learn to cope with the stress of having pancreatic cancer.  Seek advice to help you manage treatment side effects.  Keep all follow-up appointments as directed by your caregiver. SEEK IMMEDIATE MEDICAL CARE IF:   You have a sudden increase in pain.  Your skin or eyes turn more yellow.  You lose weight without trying.  You have a fever, especially during chemotherapy  treatment or after stent placement.  You notice new fatigue or weakness. Document Released: 11/05/2004 Document Revised: 02/13/2012 Document Reviewed: 12/16/2011 [facility] Patient Information 2015 [place], [place]. This information is not intended to replace advice given to you by your health care provider. Make sure you discuss any questions you have with your health care provider.

## 2015-06-09 LAB — WOUND CULTURE: GRAM STAIN: NONE SEEN

## 2015-06-11 ENCOUNTER — Telehealth: Payer: Self-pay | Admitting: *Deleted

## 2015-06-11 NOTE — Telephone Encounter (Signed)
Ok to consult [facility] for palliative care eval.

## 2015-06-11 NOTE — Telephone Encounter (Signed)
Received call from Dr. Marlowe Aschoff office stating family is now requesting palliative care for patient; it is unclear how informed pt is of prognosis or need for palliative care. Message routed to Dr. Benay Spice. Pt's partner, [name], can be reached at [phone]. Requesting call back: pt has outpt consult appt scheduled for 06/09/15.

## 2015-06-12 ENCOUNTER — Telehealth: Payer: Self-pay | Admitting: *Deleted

## 2015-06-12 ENCOUNTER — Telehealth: Payer: Self-pay | Admitting: Internal Medicine

## 2015-06-12 NOTE — Telephone Encounter (Signed)
Called [facility] and requested a Palliative Team Consult on patient. Requesting they make contact before the weekend. [place] [phone]) and spoke with Case Manager: They are only allowed to do home visit twice weekly per [facility] and currently are only approved for #4 visits. If needed more often, family could hire outside agency nurse and pay out of pocket.

## 2015-06-12 NOTE — Telephone Encounter (Signed)
Pt request refill of the following: magnesium oxide (MAG-OX) 400 MG tablet   Pt surgeon rx the above medicine and was told she needed to contact her pcp to see if this med can be rx. Person calling said they contacted the surgeon office ans was told to contact pt pcp       Phamacy:

## 2015-06-12 NOTE — Telephone Encounter (Signed)
Oncology Nurse Navigator Documentation  Oncology Nurse Navigator Flowsheets 06/12/2015  Referral date to RadOnc/MedOnc 05/22/2015  Navigator Encounter Type Introductory phone call  Barriers/Navigation Needs Family concerns;Education; wound and tube care  Partrner, Earline Mayotte expresses need for more help in the home to care for leaking G-tube and the abdominal wound that requires dressing change twice daily until the woundvac will be able to be initiated. Constant leaking around the G-tube and using urostomy bag around it per WOC, but still leaks and gastric fluids get everywhere. Empties bag when 1/4 full about 4-5 times day. Reports the abdominal wound is very difficult for her to manage, and emotionally she is having great difficulty doing it. They have supplies, just need more help with nursing skills. They have made arrangements for a couple nurse friends to help over the weekend.  Alzina ambulates in home and even goes up steps once/day. Eating small amounts-trying to push protein and fluids. Had last BM 3 days ago. No nausea (emesis only X 1). Currently [facility] nurse comes to home twice weekly. Asking if Palliative Care consult would help get someone in the home more? Made her aware that Dr. Benay Spice said this is appropriate. Given permission to pursue this. She says she is even willing to pay out of pocket for someone to come to home daily if insurance will not cover this. Informed her it likely will not cover nurse daily. They expect family to be able to do the dressing changes.  Expresses uncertainty about her being able to come to the appointment on 7/15 to see Dr. Benay Spice since it is so difficult to get her out. Made her aware that any further care orders or potential treatment does require MD to see her and assess for her ability to tolerate the regimen. [name] understands this.

## 2015-06-12 NOTE — Telephone Encounter (Signed)
It is late Friday after noon and although I haven't seen her in fu to decide  Best care for her post hospital   We can rx the medication for  3 weeks.  I will be out of the office next week. Please make post hospital appt    End of day preferred    ? 3 pm on Friday July 22nd

## 2015-06-12 NOTE — Telephone Encounter (Signed)
Called provider line at [employer]-her plan does not provide a case Freight forwarder.

## 2015-06-12 NOTE — Telephone Encounter (Signed)
Return call from [name] to be sure MD will call them today or Monday. Made her aware the note is on his desk and he will call. Provided her the breakdown of my earlier research today and that Palliative Team referral was ordered today with request to make contact before the weekend. She has also found a few nurse friends who can help with the dressing changes.

## 2015-06-13 ENCOUNTER — Telehealth: Payer: Self-pay | Admitting: Surgery

## 2015-06-13 ENCOUNTER — Telehealth: Payer: Self-pay | Admitting: General Surgery

## 2015-06-13 NOTE — Telephone Encounter (Signed)
[name]  [birth year] [id number]  Patient Care Team: Burnis Medin, MD as PCP - General Annia Belt, MD (Hematology and Oncology) Tania Ade, RN as Registered Nurse (Oncology) Autumn Messing III, MD as Consulting Physician (General Surgery) Ladell Pier, MD as Consulting Physician (Oncology) Stark Klein, MD as Consulting Physician (Surgical Oncology)  This patient is a 64 y.o.female who calls today for surgical evaluation.   05/28/2015  10:59 AM  PATIENT: Ashley Hurley 64 y.o. female  PRE-OPERATIVE DIAGNOSIS: Small Bowel Obstruction  POST-OPERATIVE DIAGNOSIS: Small Bowel Obstruction  PROCEDURE: Procedure(s): EXPLORATORY LAPAROTOMY WITH SMALL BOWEL TO LEFT COLON BYPASS - Palliative GASTROSTOMY TUBE WITH 26 MALECOT  SURGEON: Surgeon(s) and Role:  * Jovita Kussmaul, MD - Primary  * Georganna Skeans, MD - Assisting  Diagnosis Liver, needle/core biopsy, left lobe - ADENOCARCINOMA. - LYMPHOVASCULAR INVASION IS IDENTIFIED. - PERINEURAL INVASION IS IDENTIFIED. - SEE COMMENT. 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 Dr. Vicente Males has reviewed the case and concurs with this interpretation. (JBK:gt, 05/26/15) Enid Cutter MD Pathologist, Electronic Signature (Case signed 05/26/2015)  Reason for call: Leaking around Gtube  Patient with SBO due to carcinomatosis from pancreatic cancer.  Underwent internal bypass & G Tube placement.  Called by pt's wife Sat afternoon  concerned about leaking around the [place].  Gtube capped & E[place]ins pouch around tube.  Home (?private) nurse concerned about redness - wife says <2cm rim.  No purulent drainage.  Wife concerned/frustrated.  I recommended uncapping Gtube to allow stomach to drain inside tube instead of around it.  Hook up to a Foley bag more clean than letting open tube drain into Eakins pouch.   See [place]  in [place] clinic soon.  Go to ED if this gets much worse.  Consider change in tube if leak persists but usuallt wait for >6 weeks from placement to ensure Gtube tract is mature.  Could use [place] ostomy RN to help troubleshoot as well - known by them via friend.  Agree w palliative care evaluation as well.  I updated the status of the patient to the patient's wife.  I made recommendations.  I answered questions.  Understanding & appreciation was expressed.  Patient Active Problem List   Diagnosis Date Noted  . Small bowel obstruction   . Pancreatic adenocarcinoma   . Wound dehiscence   . Absolute anemia   . Atelectasis   . Hydroureter, right   . Pulmonary edema   . Liver mass   . Nausea and vomiting   . Arterial hypotension   . Protein-calorie malnutrition, severe 05/26/2015  . Abdominal pain   . SBO (small bowel obstruction) 05/22/2015  . Liver mass, left lobe 05/21/2015  . Liver lesion, left lobe   . Pancreatic mass   . Medication management 09/11/2014  . Abnormal LFTs 08/14/2013  . Visit for preventive health examination 05/23/2012  . Screening for colon cancer 05/23/2012  . Preventative health care 02/02/2011  . ALLERGIC RHINITIS 12/08/2008  . EXTRINSIC ASTHMA, UNSPECIFIED 12/08/2008  . DEFICIENCY, VITAMIN D NOS 06/25/2007  . Hyperlipidemia 05/03/2007  . ADJUSTMENT REACTION, ADULT 05/03/2007  . Essential hypertension 05/03/2007  . Hyperglycemia 05/03/2007  . BREAST CANCER, HX OF 05/03/2007    Past Medical History  Diagnosis Date  . Cancer     breast  . Hypertension   . Hyperlipidemia   . Allergy   . Chocolate cyst of ovary   .  Breast cancer, right breast Jan 2001    [id number] radiation and chemo granfortuna    Past Surgical History  Procedure Laterality Date  . Breast surgery      rt mastectomy  . Oophorectomy    . Laparotomy N/A 05/28/2015    Procedure: EXPLORATORY LAPAROTOMY WITH SMALL BOWEL TO LEFT COLON BYPASS;  Surgeon: Autumn Messing  III, MD;  Location: [facility];  Service: General;  Laterality: N/A;    History   Social History  . Marital Status: Single    Spouse Name: N/A  . Number of Children: N/A  . 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 Smoking status: Never Smoker   . Smokeless tobacco: Not on file  . Alcohol Use: 0.0 oz/week    3-4 Glasses of wine per week  . Drug Use: No  . Sexual Activity: Not on file   Other Topics Concern  . 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    Non smoker    Exercises some   G2P2   Tobacco   Etoh: ocass   caffiene coffee in am    HH of 2    Family History  Problem Relation Age of Onset  . Coronary artery disease Mother 1  . Diabetes Mother   . Cancer Father 78    pancreatic  . Cancer Sister     breast  . Hypertension      family hx  . Hypertension Sister   . Stroke Sister   . Colon cancer Neg Hx   . Rectal cancer Neg Hx   . Stomach cancer Neg Hx     Current Outpatient Prescriptions  Medication Sig Dispense Refill  . bisacodyl (DULCOLAX) 10 MG suppository Place 1 suppository (10 mg total) rectally daily as needed for mild constipation or moderate constipation. 12 suppository 0  . ciprofloxacin (CIPRO) 500 MG tablet Take 1 tablet (500 mg total) by mouth 2 (two) times daily. 14 tablet 0  . docusate sodium (COLACE) 100 MG capsule Take 1 capsule (100 mg total) by mouth 2 (two) times daily. 60 capsule 0  . escitalopram (LEXAPRO) 20 MG tablet TAKE 1 TABLET EVERY DAY 30 tablet 12  . feeding supplement, ENSURE ENLIVE, (ENSURE ENLIVE) LIQD Take 237 mLs by mouth 2 (two) times daily between meals.    . hydrocortisone (CORTEF) 10 MG tablet Take 1 tablet (10 mg total) by mouth daily. 30 tablet 0  . magnesium oxide (MAG-OX) 400 MG tablet Take 1 tablet (400 mg total) by mouth 2 (two) times daily. 10  tablet 0  . megestrol (MEGACE) 40 MG/ML suspension Take 5 mLs (200 mg total) by mouth 2 (two) times daily. 240 mL 0  . omeprazole (PRILOSEC) 40 MG capsule Take 1 capsule (40 mg total) by mouth daily. 30 capsule 2  . oxyCODONE (OXY IR/ROXICODONE) 5 MG immediate release tablet Take 1 tablet (5 mg total) by mouth every 6 (six) hours as needed for moderate pain, severe pain or breakthrough pain. 30 tablet 0   No current facility-administered medications for this visit.     Allergies  Allergen Reactions  . Aspirin     @VS @  Dg Abd 1 View  05/23/2015   CLINICAL DATA:  Follow-up small bowel obstruction. Subsequent encounter.  EXAM: ABDOMEN - 1 VIEW  COMPARISON:  Abdominal radiograph performed 05/22/2015  FINDINGS: Persistent dilated small bowel loops are seen, measuring up to 4.9 cm in diameter, concerning for persistent small bowel obstruction. There are nondistended loops of small bowel interspersed with the distended loops, and some of this may reflect underlying ileitis as noted on recent CT, in conjunction with the patient's known abdominal masses.  Contrast is noted within the descending colon and rectum, as on the prior study. No free intra-abdominal air is identified, though evaluation for free air is limited on a single supine view. An enteric tube is noted ending overlying the body of the stomach. No acute osseous abnormalities are seen.  IMPRESSION: Persistent dilated small bowel loops, measuring up to 4.9 cm in diameter, concerning for persistent small bowel obstruction. Underlying nondistended loops small bowel are also seen. Some of this may reflect underlying ileitis as noted on recent CT, in conjunction with obstruction from the patient's known right lower quadrant mass.   Electronically Signed   By: Garald Balding M.D.   On: 05/23/2015 09:22   US Abdomen Complete  05/18/2015   CLINICAL DATA:  Nausea and vomiting, weight loss. Acute renal insufficiency.  EXAM: ULTRASOUND ABDOMEN COMPLETE   COMPARISON:  None.  FINDINGS: Gallbladder: Possible sludge and small gallstones are noted. Mild gallbladder wall thickening is noted measuring 6 mm. No pericholecystic fluid is noted. No sonographic Murphy's sign is noted.  Common bile duct: Diameter: 9 mm which is mildly dilated.  Liver: 2.7 cm complex abnormality seen in left hepatic lobe concerning for possible neoplasm.  IVC: Visualized portion appears normal.  Pancreas: Visualized portion unremarkable.  Spleen: Size and appearance within normal limits.  Right Kidney: Length: 9.7 cm. Echogenicity within normal limits. Moderate hydronephrosis is noted.  Left Kidney: Length: 11.1 cm. Echogenicity within normal limits. Mild left hydronephrosis is noted.  Abdominal aorta: No aneurysm visualized.  Other findings: Minimal ascites is noted. Probable epigastric mass measuring 4.1 x 3.6 x 2.9 cm is noted.  IMPRESSION: Possible sludge in minimal cholelithiasis is noted with gallbladder wall thickening, but no pericholecystic fluid. Acute cholecystitis cannot be excluded, and HIDA scan may be performed for further evaluation.  2.7 cm complex abnormality seen in left hepatic lobe which may represent mass or neoplasm. Also noted is possible epigastric mass measuring 4.1 x 3.6 x 2.9 cm. CT scan of the abdomen and pelvis with intravenous contrast administration is recommended for further evaluation.  Minimal left hydronephrosis is noted with moderate right hydronephrosis. Further evaluation with CT scan is recommended.   Electronically Signed   By: Marijo Conception, M.D.   On: 05/18/2015 16:59   Ct Abdomen Pelvis W Contrast  05/19/2015   CLINICAL DATA:  Nausea and vomiting for 3 weeks. Abdominal mass. Acute renal failure. Liver mass and hydronephrosis seen on recent ultrasound.  EXAM: CT ABDOMEN AND PELVIS WITH CONTRAST  TECHNIQUE: Multidetector CT imaging of the abdomen and pelvis was performed using the standard protocol following bolus administration of intravenous  contrast.  CONTRAST:  36mL OMNIPAQUE IOHEXOL 300 MG/ML  SOLN  COMPARISON:  None.  FINDINGS: Lower Chest: No acute findings.  Hepatobiliary: Poorly defined hypovascular mass is seen in the lateral segment left hepatic lobe measuring 3.2 x 3.5 cm on image 15 of series 2.  Another low-attenuation lesion is seen in the left hepatic lobe at the falciform ligament measuring approximately 1.8 x 2.6 cm on image 20. This could be due to focal fatty infiltration although hepatic neoplasm cannot be excluded.  A  1 cm cyst is seen in the dome of the left hepatic lobe. Diffuse biliary ductal dilatation is demonstrated. No definite signs of acute cholecystitis.  Pancreas: An ill-defined hypovascular mass is seen which is centered in the body of the pancreas which measures 3.4 x 6.1 cm on image 22/series 2. Distal pancreatic atrophy noted. 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 There is no evidence of portal vein thrombus. There is no direct involvement of the celiac axis, superior mesenteric artery, or superior mesenteric vein.  Spleen: Within normal limits in size and appearance. Upper abdominal venous collaterals are seen likely due to splenic vein thrombosis.  Adrenals:  No masses identified.  Kidneys/Urinary Tract: No renal masses are identified. Moderate right hydroureteronephrosis is seen . There is a irregular soft tissue mass obstructing the pelvic portion of the right ureter, as described below. This mass is located in the right adnexal region adjacent to the right iliac vessels and measures approximately 3.4 x 2.8 cm on image 64/series 2. There is adjacent lymphadenopathy in the right lower quadrant mesentery.  Stomach/Bowel/Peritoneum: Distal small bowel obstruction is seen with transition point at site of mass in the right pelvis/adnexal region described below. Mild ascites seen in the perihepatic space and inferior pelvis.a   Vascular/Lymphatic: Mild lymphadenopathy in the porta hepatis portacaval space, measuring up to 15 mm. Right lower quadrant mesenteric lymphadenopathy is seen adjacent to the right adnexal mass, with largest lymph node measuring 10 mm on image 62/series 2.  Reproductive: Uterus is unremarkable. There is an irregular soft tissue mass in the right adnexa adjacent to the right iliac vessels measuring approximately 4 x 5 cm in maximum diameter on image 49/series 602. This mass obstructs the right ureter and distal small bowel loops in this region.  Other:  None.  Musculoskeletal:  No suspicious bone lesions identified.  IMPRESSION: 6 cm hypovascular mass centered in the pancreatic body, suspicious for primary pancreatic carcinoma. This mass shows encasement of the splenic artery and vein, with splenic vein thrombosis. Consider EUS for tissue diagnosis.  Diffuse biliary ductal dilatation, likely secondary to mild porta hepatis lymphadenopathy/metastatic disease.  3.5 cm hypovascular mass in the lateral segment of the left hepatic lobe, suspicious for liver metastasis. Indeterminate left hepatic lobe lesion adjacent to the falciform ligament could represent focal fatty infiltration, although hepatic metastasis cannot be excluded.  Right hydroureteronephrosis and distal small bowel obstruction due to irregular soft tissue mass in the right adnexa approximately 4 x 5 cm. Adjacent mesenteric lymphadenopathy also seen. Differential diagnosis includes metastatic disease, or primary malignancies such as ovarian carcinoma, appendiceal carcinoma, or carcinoid tumor.  Mild ascites.   Electronically Signed   By: Earle Gell M.D.   On: 05/19/2015 14:22   US Renal  05/18/2015   CLINICAL DATA:  Acute renal insufficiency. Nausea vomiting. Weight loss.  EXAM: RENAL / URINARY TRACT ULTRASOUND COMPLETE  COMPARISON:  None.  FINDINGS: Right Kidney:  Length: 9.7 cm. Moderate right hydronephrosis. Right kidney is slightly echogenic  suggesting associated medical renal disease. No focal renal mass.  Left Kidney:  Length: 11.1 cm. Minimal caliectasis noted over the lower pole calices. Left kidney is slightly echogenic suggesting the possibility associated chronic medical renal disease. No focal renal mass.  Bladder:  No bladder distention.  Mild ascites.  IMPRESSION: 1. Moderate right hydronephrosis. Mild caliectasis left lower renal pole. No bladder distention.  2. Kidneys are slightly echogenic suggesting a component  of chronic medical renal disease.  3. Mild ascites. Reference is made to abdominal ultrasound report for discussion of abdominal masses.   Electronically Signed   By: Marcello Moores  Register   On: 05/18/2015 16:56   US Biopsy  05/25/2015   INDICATION: 64 year old female with a new imaging diagnosis of pancreatic mass and liver mass, referred for image guided biopsy.  EXAM: ULTRASOUND GUIDED RENAL BIOPSY  COMPARISON:  CT abdomen 05/19/2015  MEDICATIONS: Fentanyl 2.0 mcg IV; Versed 50 mg IV  ANESTHESIA/SEDATION: Total Moderate Sedation time  Twenty minutes  COMPLICATIONS: None  PROCEDURE: Informed written consent was obtained from the patient after a discussion of the risks, benefits and alternatives to treatment. The patient understands and consents the procedure. A timeout was performed prior to the initiation of the procedure.  Ultrasound survey of the epigastric region was performed, with images of the left liver lobe stored and sent to PACs.  The epigastric region was then prepped and draped in the usual sterile fashion.  Using sterile technique, the skin and subcutaneous tissues were generously infiltrated 1% lidocaine for local anesthesia. The musculature was also infiltrated with 1% lidocaine to the level of the liver capsule.  Using ultrasound, a guide needle was advanced into the heterogeneously echoic lesion of the left liver lobe. After we confirmed position of the needle tip with ultrasound, 4 separate 18 gauge core biopsy  were retrieved.  Specimen were placed into formalin for transportation to the lab.  Gel-Foam pledgets were then infused with a small amount of saline.  The patient tolerated the procedure well and remained hemodynamically stable throughout.  No complications were encountered and no significant blood loss was encountered.  FINDINGS: Ultrasound survey and images during the case demonstrate heterogeneous lesion within the left liver lobe. Needle tip was observed within the lesion on each pass.  Gas was present on the final image after infusion of Gel-Foam pledgets, with no complicating features identified.  IMPRESSION: Status post ultrasound-guided biopsy of left liver lobe mass, with tissue specimen sent to pathology for complete histopathologic analysis.  Signed,  Dulcy Fanny. Earleen Newport, DO  [facility]  [facility]   Electronically Signed   By: Corrie Mckusick D.O.   On: 05/25/2015 16:27   Dg Chest Port 1 View  05/31/2015   CLINICAL DATA:  Pulmonary edema  EXAM: PORTABLE CHEST - 1 VIEW  COMPARISON:  05/29/2015; 05/22/2015  FINDINGS: Grossly unchanged cardiac silhouette and mediastinal contours. Stable position of support apparatus. Worsening right mid and lower lung heterogeneous/consolidative opacities. Left basilar/retrocardiac opacities are unchanged. No definite pleural effusion or evidence of edema. Unchanged bones.  IMPRESSION: Worsening right mid and lower lung opacities, worrisome for progression of infection.   Electronically Signed   By: Sandi Mariscal M.D.   On: 05/31/2015 07:57   Dg Chest Port 1 View  05/29/2015   CLINICAL DATA:  Atelectasis  EXAM: PORTABLE CHEST - 1 VIEW  COMPARISON:  05/22/2015  FINDINGS: Cardiac shadow is within normal limits. The inspiratory effort is poor when compared with the prior exam. Postsurgical changes are new again noted on the right. Minimal right basilar atelectasis is noted. No focal confluent infiltrate is seen.  IMPRESSION:  Minimal right basilar atelectasis.   Electronically Signed   By: Inez Catalina M.D.   On: 05/29/2015 08:17   Dg Abd 2 Views  05/26/2015   CLINICAL DATA:  History of small bowel obstruction, NG tube placement  EXAM: ABDOMEN - 2 VIEW  COMPARISON:  Abdomen  films of 05/24/2015  FINDINGS: There are persistently dilated small bowel loops most consistent with partial small bowel obstruction with scattered air-fluid levels. No significant colonic bowel gas is seen with a small amount contrast within the nondistended descending and rectosigmoid colon. No free air is seen on the erect view. An NG tube is present with the tip coiling in the antrum of the stomach.  IMPRESSION: 1. Persistently dilated loops of small bowel consistent with partial small bowel obstruction. 2. NG tube coils in the antrum of the stomach.   Electronically Signed   By: Ivar Drape M.D.   On: 05/26/2015 07:58   Dg Abd 2 Views  05/24/2015   CLINICAL DATA:  Small bowel obstruction  EXAM: ABDOMEN - 2 VIEW  COMPARISON:  05/23/2015  FINDINGS: Retained contrast within nondilated colon. Decrease in caliber of mildly dilated right lower quadrant loop of small bowel measuring 3.5 cm maximally. Clips project over the left abdomen. No acute osseous abnormality.  IMPRESSION: Mild decrease in gaseous dilatation of small bowel loops which may indicate improving small bowel obstruction.   Electronically Signed   By: Conchita Paris M.D.   On: 05/24/2015 09:56   Dg Abd Acute W/chest  05/22/2015   CLINICAL DATA:  Acute lower abdominal pain.  EXAM: DG ABDOMEN ACUTE W/ 1V CHEST  COMPARISON:  CT scan of May 19, 2015.  FINDINGS: Dilated small bowel loops are noted concerning for distal small bowel obstruction. Residual contrast is seen in nondilated distal colon and rectum. Heart size and mediastinal contours are within normal limits. Both lungs are clear.  IMPRESSION: Dilated small bowel loops are noted consistent with distal small bowel obstruction.    Electronically Signed   By: Marijo Conception, M.D.   On: 05/22/2015 14:54   Dg Abd Portable 1v  05/27/2015   CLINICAL DATA:  Nausea and vomiting.  EXAM: PORTABLE ABDOMEN - 1 VIEW  COMPARISON:  May 26, 2015.  FINDINGS: Nasogastric tube noted on prior exam has been removed. Residual contrast remains in the left colon and rectum. Dilated small bowel loops are noted in the left upper quadrant and right lower quadrant which appear to be increased compared to prior exam concerning for small bowel obstruction. Surgical clips are noted in the left upper quadrant as well. Phleboliths are seen in the pelvis.  IMPRESSION: Dilated loops of small bowel are noted which are more prominent compared to prior exam, concerning for small bowel obstruction.   Electronically Signed   By: Marijo Conception, M.D.   On: 05/27/2015 09:41   Dg [place] Dys Tube Plc W/fl W/rad  05/22/2015   CLINICAL DATA:  Nasogastric tube placement requested  EXAM: NASO G TUBE PLACEMENT WITH FL AND WITH RAD  CONTRAST:  5 cc Omnipaque injected per NG tube  FLUOROSCOPY TIME:  Fluoroscopy Time (in minutes and seconds): 1 minutes 6 seconds  COMPARISON:  No similar prior exam is available at this institution for comparison or on [facility].  FINDINGS: Under fluoroscopic guidance, a 14 French nasogastric tube was inserted via the left nare, with visualization of termination of the tip over the left upper quadrant. 5 cc injected Omnipaque contrast verified intragastric positioning of the nasogastric tube. Return of clear yellow gastric content per NG tube was also visualized. The nasogastric tube was secured with tape and the patient returned to the nursing unit in stable position.  IMPRESSION: Fluoroscopically guided nasogastric tube placement with tip in the stomach.   Electronically Signed   By:  Conchita Paris M.D.   On: 05/22/2015 18:52    Note: This dictation was prepared with Dragon/digital dictation along with [employer]. Any transcriptional  errors that result from this process are unintentional.

## 2015-06-13 NOTE — Telephone Encounter (Signed)
Pt's family with multiple calls overnight due to concern of how G tube was functioning.  Pt asymptomatic.  Tube flushes fine.  THere is a RN friend that is coming out to check on her today.  The family will call on Mon if they have any other concerns.

## 2015-06-15 ENCOUNTER — Encounter (HOSPITAL_COMMUNITY): Payer: Self-pay | Admitting: Emergency Medicine

## 2015-06-15 ENCOUNTER — Telehealth: Payer: Self-pay | Admitting: General Surgery

## 2015-06-15 ENCOUNTER — Emergency Department (HOSPITAL_COMMUNITY)
Admission: EM | Admit: 2015-06-15 | Discharge: 2015-06-15 | Disposition: A | Discharge status: Home / Self Care | Payer: BC Managed Care – PPO | Attending: Emergency Medicine | Admitting: Emergency Medicine

## 2015-06-15 DIAGNOSIS — Z853 Personal history of malignant neoplasm of breast: Secondary | ICD-10-CM | POA: Insufficient documentation

## 2015-06-15 DIAGNOSIS — Z79899 Other long term (current) drug therapy: Secondary | ICD-10-CM | POA: Insufficient documentation

## 2015-06-15 DIAGNOSIS — R531 Weakness: Secondary | ICD-10-CM | POA: Diagnosis present

## 2015-06-15 DIAGNOSIS — Z8742 Personal history of other diseases of the female genital tract: Secondary | ICD-10-CM | POA: Diagnosis not present

## 2015-06-15 DIAGNOSIS — I10 Essential (primary) hypertension: Secondary | ICD-10-CM | POA: Insufficient documentation

## 2015-06-15 DIAGNOSIS — K9423 Gastrostomy malfunction: Secondary | ICD-10-CM | POA: Diagnosis not present

## 2015-06-15 DIAGNOSIS — Z792 Long term (current) use of antibiotics: Secondary | ICD-10-CM | POA: Diagnosis not present

## 2015-06-15 DIAGNOSIS — E785 Hyperlipidemia, unspecified: Secondary | ICD-10-CM | POA: Insufficient documentation

## 2015-06-15 DIAGNOSIS — K9429 Other complications of gastrostomy: Secondary | ICD-10-CM

## 2015-06-15 MED ORDER — MAGNESIUM OXIDE 400 MG PO TABS: 400.0000 mg | ORAL_TABLET | Freq: Two times a day (BID) | ORAL | Status: AC

## 2015-06-15 NOTE — Telephone Encounter (Signed)
Medication has been sent to the pharmacy.  Please make follow up appt as advised by [facility].  Thanks!

## 2015-06-15 NOTE — Telephone Encounter (Signed)
Pt's spouse called multiple times again tonight complaining of leakage around the g tube causing skin irritation.  She still does not understand how to flush the tube, but states the tube is draining some.  She called back stating there is fecal matter coming from around the tube opening as well.  Pt denies severe abd pain, fevers or tachycardia.  Pt's spouse concerned about infection developing inside the abdomen.  I recommended that she be evaluated in the ED if she felt that there was a concern that the patient was in distress.  Otherwise, we can evaluate her in urgent office if needed tomorrow.  She expressed understanding of this.

## 2015-06-15 NOTE — Consult Note (Signed)
WOC consulted to offer suggestions for leakage at her G tube site.  I have previously seen this patient during her inpatient admission and I placed a urostomy pouch around her tube to allow for the leakage to be collected and drained as well as protecting her skin from the acidity of the effluent. This was working well while inpatient, however once at home the pouching system was difficult for the [facility] and the patient's wife to manage.  There report large amounts of drainage that is saturating her clothing and the dressing.  Today when I arrived she has her G tube connected to a BSD which had quite a bit of drainage in it.   The peristomal skin is erythematous approximately 1cm circumferentially which is similar to how her skin looked while inpatient, however their is much more erosion around the tube.  She now also presents with a candida infection, mostly laterally with satellite lesions on the patients flank area consist ant with where the leakage would be when the patient is supine.   There is no really good options at this time, due to the denudation of the skin, however I did attempt to "crust" the skin with antifungal powder and no sting skin prep wipes. Making sure the peristomal skin was dry, I applied flat 1pc urostomy pouch with 2" barrier ring to the skin.  I directed the Gtube into the pouch due to the fact the patient reported much more discomfort when I routed the pouch out the top of the pouch (more pulling and twisting on the tube).  I did not clamp the tube off since they would have not way to open with the one pc. Pouch.   I have offered a second option if the pouch begins to leak once back at home, which is the use of Calmoseptine a zinc and calamine skin barrier ointment.  They have been instructed to apply a thick layer around the Gtube and use gauze around tube for drainage as before.  I have cautioned them to only clean gently around the skin when the dressings are changed and that they  should not try to remove all the barrier cream each time, but instead reapply another thick layer.  I have sent samples of Calmoseptine and antifungal powder home with the patient and her wife.   I also changed the midline abdominal wound dressing per the wife's request.  Saline moist to moist dressing placed, topped with ABD pads, secured with [place] straps.   The wife [name] is quite overwhelmed and needs more assistance at home.  I have suggested that a CM talk to her while in the ED to see what options they have.  Apparently Hospice is scheduled to evaluate [place] today.  I have offered my support to [name] for this and agree this would be helpful at this time.  I did mention options for Hospice inpatient at [facility], however the wife does not think the patient is ready for that at this time.   Extended visit with this family, greater than 1 hour  Discussed POC with patient and bedside nurse.  Re consult if needed, will not follow at this time. Thanks  Mitzy Naron Kellogg, [employer] [phone])

## 2015-06-15 NOTE — Progress Notes (Signed)
[facility] Progress Note     Subjective: Pt has been having extensive problems with leaking G-tube.  This was treated with an ostomy pouch during her hospitalization with good relief, but when she went home the family had trouble managing the bag and they are at their wits end.  Dr. Johney Maine recommended putting the g-tube to foley bag, but they are having to clean around the area every hour all night long secondary to excessive drainage.  The family is concerned about applying the bags adequately enough.  They are interested in hiring someone to come out.  They are pending a palliative care appointment as well as oncology this week.  They are asking why she even needs the g-tube.  She doesn't want to live with it forever.     Objective: Vital signs in last 24 hours: Temp:  [98.4 F (36.9 C)] 98.4 F (36.9 C) (07/11 0552) Pulse Rate:  [79-94] 84 (07/11 0800) Resp:  [9-23] 18 (07/11 0800) BP: (94-104)/(61-69) 96/68 mmHg (07/11 0800) SpO2:  [93 %-99 %] 99 % (07/11 0800)    Intake/Output from previous day:   Intake/Output this shift:    PE: Gen: Alert, NAD, pleasant Abd: Soft, not very tender, +BS, no HSM, midline wound with 30% slough, no significant wound dehiscence. Good granulation tissue formation now at base of wound inferior wound better than superior wound. G-tube in place with ostomy pouch with tan enteric contents leaking around the g-tube.   Lab Results:  No results for input(s): WBC, HGB, HCT, PLT in the last 72 hours. BMET No results for input(s): NA, K, CL, CO2, GLUCOSE, BUN, CREATININE, CALCIUM in the last 72 hours. PT/INR No results for input(s): LABPROT, INR in the last 72 hours. CMP     Component Value Date/Time   NA 135 06/08/2015 0141   K 3.4* 06/08/2015 0141   CL 102 06/08/2015 0141   CO2 26 06/08/2015 0141   GLUCOSE 136* 06/08/2015 0141   BUN 13 06/08/2015 0141   CREATININE 1.27* 06/08/2015 0141   CALCIUM 7.1* 06/08/2015 0141   PROT 4.1*  06/04/2015 0510   ALBUMIN 1.2* 06/04/2015 0510   AST 25 06/04/2015 0510   ALT 40 06/04/2015 0510   ALKPHOS 162* 06/04/2015 0510   BILITOT 3.3* 06/04/2015 0510   GFRNONAA 43* 06/08/2015 0141   GFRAA 50* 06/08/2015 0141   Lipase     Component Value Date/Time   LIPASE 57* 05/22/2015 1153       Studies/Results: No results found.  Anti-infectives: Anti-infectives    None       Assessment/Plan Pancreatic cancer with carcinomatosis - pending palliative and oncology follow up this week SBO s/p ex lap, ileocolonic bypass, G-tube placement  Wound - healing nicely, no more infection seent, not ready for wound vac maybe 4-7 days more, continue BID WD dressing changes for now  Leaking G-tube - Melody our [place] nurse to see and eval and give recommendations, needs G tube for potential venting tool in the future when she is no longer able to eat because of progressive disease.  She has to keep the G tube for at least 6-8 weeks before it could be considered to come out if patient decides she doesn't want it, but with delayed wound healing may be longer. -Has follow up pending with Dr. Barry Dienes in 3 more weeks when she returns from vacation, patient is aware of this timing.      Nat Christen 06/15/2015, 8:25 AM Pager: [phone]

## 2015-06-15 NOTE — Progress Notes (Signed)
LCSW received call and met with RN regarding patient and family request for additional assistance in the home. Chart reviewed and assessment completed with patient.  Patient is already established with home health: [facility].  Family reports they come out 2 times a week, but it is just not enough.  Patient has appointments with palliative and oncology this week with regards to additional services involving her care.  The main stressors for patient and family are her wound care and her g-tube. LCSW gave information about private care centers/agencies and out of pocket expense along with SNF information in which they are not agreeable too at this time. Patient does has MEDICARE per her report and BCBS (as she still works for [employer]). Primary payor source is [employer].    Family aware there are limited options to be covered by insurance and aware that out of pocket is best alternative for additional hours in the home.  Patient agreeable to speak with [facility] agency about any other options and re-visit SNF option as last resort.   Per family patient has soiled her clothing and needing some scrubs.  LCSW brought down shirt and pants for patient to keep and transport home in from the hospital. EMS transport was offered, patient declined and reports her family will take her home.  Lane Hacker, MSW Clinical Social Work: Emergency Room [phone]

## 2015-06-15 NOTE — ED Provider Notes (Signed)
CSN: [id number]     Arrival date & time 06/15/15  9678 History   First MD Initiated Contact with Patient 06/15/15 0545     Chief Complaint  Patient presents with  . Hypotension  . Weakness    HPI Patient has a history of advanced pancreatic cancer.  She currently has a venting G-tube which is not healing.  She presents today because of ongoing drainage around the gastrostomy tube.  She's tried placement of an ostomy pouch without improvement she's also had it open to straight drain to a Foley catheter bag.  Neither seem to be working well and she is frustrated.  She otherwise reports she is eating and drinking normally.  She denies fever.  She denies significant abdominal pain.  She is jaundice better partner and friends in the room states that her jaundice is improving.  She underwent surgery on 05/28/2015.   Past Medical History  Diagnosis Date  . Cancer     breast  . Hypertension   . Hyperlipidemia   . Allergy   . Chocolate cyst of ovary   . Breast cancer, right breast Jan 2001    [id number] radiation and chemo granfortuna   Past Surgical History  Procedure Laterality Date  . Breast surgery      rt mastectomy  . Oophorectomy    . Laparotomy N/A 05/28/2015    Procedure: EXPLORATORY LAPAROTOMY WITH SMALL BOWEL TO LEFT COLON BYPASS;  Surgeon: Autumn Messing III, MD;  Location: [facility];  Service: General;  Laterality: N/A;  . Cholecystectomy    . Colon surgery     Family History  Problem Relation Age of Onset  . Coronary artery disease Mother 17  . Diabetes Mother   . Cancer Father 5    pancreatic  . Cancer Sister     breast  . Hypertension      family hx  . Hypertension Sister   . Stroke Sister   . Colon cancer Neg Hx   . Rectal cancer Neg Hx   . Stomach cancer Neg Hx    History  Substance Use Topics  . Smoking status: Never Smoker   . Smokeless tobacco: Not on file  . Alcohol Use: 0.0 oz/week    3-4 Glasses of wine per week   OB History    No data available      Review of Systems  All other systems reviewed and are negative.     Allergies  Aspirin  Home Medications   Prior to Admission medications   Medication Sig Start Date End Date Taking? Authorizing Provider  ciprofloxacin (CIPRO) 500 MG tablet Take 1 tablet (500 mg total) by mouth 2 (two) times daily. 06/08/15  Yes Stark Klein, MD  escitalopram (LEXAPRO) 20 MG tablet TAKE 1 TABLET EVERY DAY 09/09/14  Yes Burnis Medin, MD  feeding supplement, ENSURE ENLIVE, (ENSURE ENLIVE) LIQD Take 237 mLs by mouth 2 (two) times daily between meals. Patient taking differently: Take 237 mLs by mouth daily.  06/08/15  Yes Modena Jansky, MD  hydrocortisone (CORTEF) 10 MG tablet Take 1 tablet (10 mg total) by mouth daily. 06/08/15  Yes Modena Jansky, MD  magnesium oxide (MAG-OX) 400 MG tablet Take 1 tablet (400 mg total) by mouth 2 (two) times daily. 06/08/15  Yes Modena Jansky, MD  megestrol (MEGACE) 40 MG/ML suspension Take 5 mLs (200 mg total) by mouth 2 (two) times daily. 06/08/15  Yes Stark Klein, MD  ondansetron (ZOFRAN-ODT) 4 MG disintegrating tablet  Take 4 mg by mouth every 8 (eight) hours as needed for nausea or vomiting.   Yes Historical Provider, MD  oxyCODONE (OXY IR/ROXICODONE) 5 MG immediate release tablet Take 1 tablet (5 mg total) by mouth every 6 (six) hours as needed for moderate pain, severe pain or breakthrough pain. 06/08/15  Yes Modena Jansky, MD   BP 94/66 mmHg  Pulse 84  Temp(Src) 98.4 F (36.9 C) (Oral)  Resp 23  SpO2 95% Physical Exam  Constitutional: She is oriented to person, place, and time. She appears well-developed and well-nourished. No distress.  HENT:  Head: Normocephalic and atraumatic.  Eyes: EOM are normal.  Neck: Normal range of motion.  Cardiovascular: Normal rate, regular rhythm and normal heart sounds.   Pulmonary/Chest: Effort normal and breath sounds normal.  Abdominal: Soft. She exhibits no distension. There is no tenderness.  Gastrostomy and left  upper quadrant without surrounding erythema.  There is a small amount of what appears to be candidiasis.  There is a small amount of leakage of gastric contents around her gastrostomy tube  Musculoskeletal: Normal range of motion.  Neurological: She is alert and oriented to person, place, and time.  Skin: Skin is warm and dry.  jaundice  Psychiatric: She has a normal mood and affect. Judgment normal.  Nursing note and vitals reviewed.   ED Course  Procedures (including critical care time) Labs Review Labs Reviewed - No data to display  Imaging Review No results found.   EKG Interpretation None      MDM   Final diagnoses:  None    Ostomy nurse to place new pouch. Palliative care to see later today at home. Oncology on Friday.  She has very advanced cancer.  She is beginning to understand this.    Jola Schmidt, MD 06/15/15 [phone]

## 2015-06-15 NOTE — ED Notes (Signed)
Wound care Nurse at bedside with patient and family.

## 2015-06-15 NOTE — ED Notes (Signed)
EMS was called to assist due to pt having extreem weakness, when they arrived her pressure was 80/60 and she appeared very jaundous.  Upon arrival at the ED vital signs were as follows: 104/66, pulse 80, Resp 16, 98% on RA. She has been given 563ml of NS.  Upon arrival her colonoscopy bag was leaking.

## 2015-06-15 NOTE — Telephone Encounter (Signed)
Pt has been sch

## 2015-06-16 NOTE — Progress Notes (Signed)
NCM received call from [employer] rep, Ricki and wound vac was approved. Requesting delivery time for wound vac. NCM did follow up with Jomarie Longs PA, CCS and wound needs to be 80% clean prior to wound vac placement. Pt will probably need 3-7 days. [facility] can follow up with office prior to placement if they have any questions. [employer] will deliver wound vac to home. Notified [facility] rep with info from [place], [place] in regards to placement of wound vac. Jonnie Finner RN CCM Case Mgmt phone [phone]

## 2015-06-19 ENCOUNTER — Ambulatory Visit: Payer: BC Managed Care – PPO | Admitting: Physical Therapy

## 2015-06-19 ENCOUNTER — Ambulatory Visit: Payer: BC Managed Care – PPO | Admitting: Oncology

## 2015-06-19 ENCOUNTER — Encounter: Payer: BC Managed Care – PPO | Admitting: Nutrition

## 2015-06-19 ENCOUNTER — Telehealth: Payer: Self-pay | Admitting: *Deleted

## 2015-06-19 ENCOUNTER — Telehealth: Payer: Self-pay | Admitting: Internal Medicine

## 2015-06-19 NOTE — Telephone Encounter (Signed)
TC from patient's partner. She states that [employer] with Hospice now and will not be coming in for her appts today.

## 2015-06-19 NOTE — Telephone Encounter (Signed)
Received hospice admission orders for Dr. Benay Spice to sign. Called hospice, spoke with [facility]. Dr. Benay Spice has never seen pt. Does not feel comfortable managing her care with hospice, please forward to pt's PCP or hospice medical director.

## 2015-06-19 NOTE — Telephone Encounter (Signed)
Betsy w/ hospice of [place] states pt has been accepted to hospice and they would like to know if dr Regis Bill will be the attending PCP pls cb

## 2015-06-22 ENCOUNTER — Telehealth: Payer: Self-pay | Admitting: *Deleted

## 2015-06-22 NOTE — Telephone Encounter (Signed)
I  can be attending  but would ask for assistance on pain management  meds    She has appt with me on July 22 I hope she can keep.  Thanks  [facility]

## 2015-06-22 NOTE — Telephone Encounter (Signed)
Call from [facility] in hospice admissions asking if Dr. Benay Spice will agree to be attending MD until pt's PCP is back in the office. Reviewed with MD. Velta Addison. Order called to [facility] in admissions.

## 2015-06-23 ENCOUNTER — Telehealth: Payer: Self-pay | Admitting: Internal Medicine

## 2015-06-23 NOTE — Telephone Encounter (Signed)
Ashley Hurley seen her since before her hospitalization  Over a month ago .   Best would be office visit  If not then   other clinician evaluation assesment in the issue at hand   To be able to   Help.  Can have  hospice nurses  evaluate and can tell us  More and document clinical status. . Find out what is required .

## 2015-06-23 NOTE — Telephone Encounter (Signed)
Pt wife call to say that she need a letter stating that she is not able to handle her own affairs. She said she need this letter for [place]

## 2015-06-23 NOTE — Telephone Encounter (Signed)
Spoke to [place].  Advised that [facility] will be the attending but would like for a physician at hospice to take over pain management.

## 2015-06-24 NOTE — Telephone Encounter (Signed)
Spoke to [employer].  They have acquired a note from one of the hospice physicians.  Ashley Hurley asked that all upcoming appointments be cancelled due to rapidly declining health.  No appointments were scheduled.  Asked that Ashley Hurley call if we could do anything or help her in any way.

## 2015-06-26 ENCOUNTER — Ambulatory Visit: Payer: BC Managed Care – PPO | Admitting: Internal Medicine

## 2015-07-06 DEATH — deceased

## 2015-07-07 DIAGNOSIS — Z48815 Encounter for surgical aftercare following surgery on the digestive system: Secondary | ICD-10-CM

## 2015-08-06 IMAGING — CR DG CHEST 1V PORT
1 series · 1 of 1 positions shown · non-contrast
Comparison: 05/29/2015; 05/22/2015

CLINICAL DATA: Pulmonary edema

EXAM:
PORTABLE CHEST - 1 VIEW

[AP]
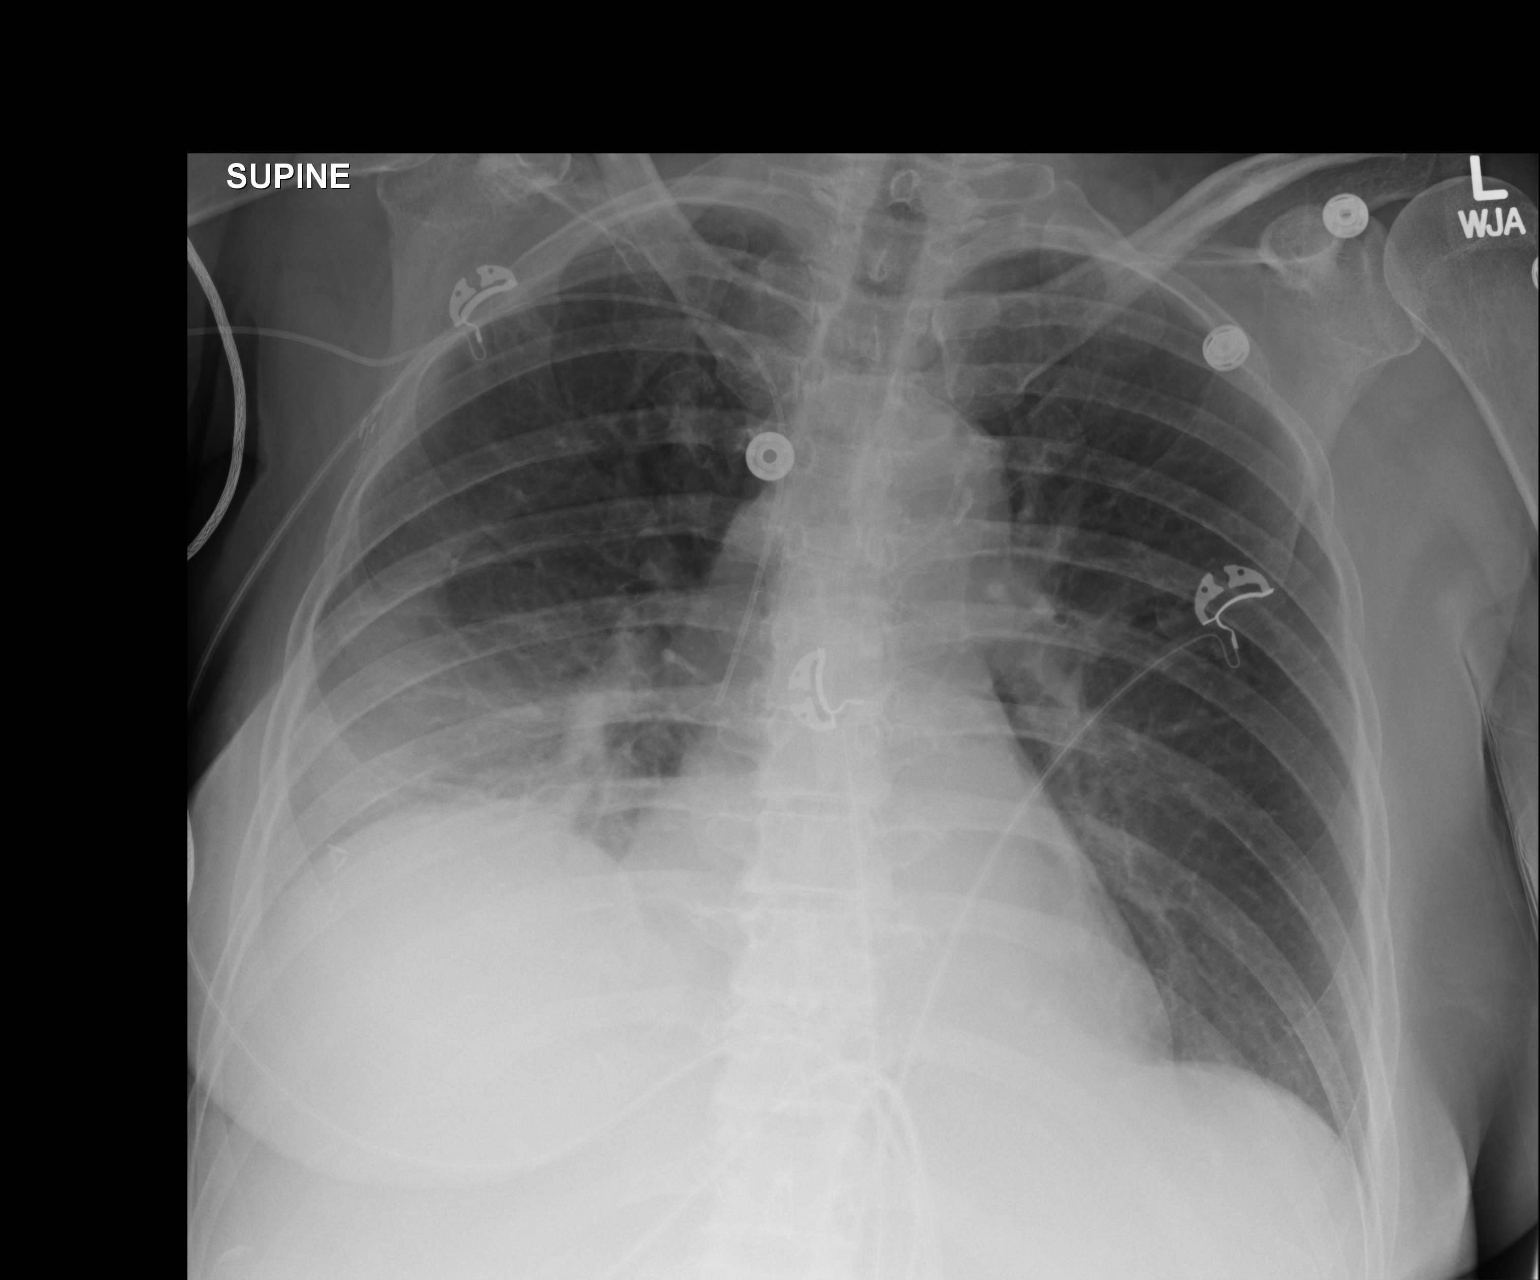

[1 of 1 positions shown; findings below may reference images not displayed]

FINDINGS: Grossly unchanged cardiac silhouette and mediastinal contours.
Stable position of support apparatus. Worsening right mid and lower
lung heterogeneous/consolidative opacities. Left
basilar/retrocardiac opacities are unchanged. No definite pleural
effusion or evidence of edema. Unchanged bones.
IMPRESSION: Worsening right mid and lower lung opacities, worrisome for
progression of infection.

## 2017-08-25 ENCOUNTER — Encounter: Payer: Self-pay | Admitting: Internal Medicine

## 2022-08-17 ENCOUNTER — Encounter: Payer: Self-pay | Admitting: Gastroenterology

## 2022-12-16 ENCOUNTER — Telehealth: Payer: Self-pay | Admitting: Internal Medicine

## 2022-12-16 NOTE — Telephone Encounter (Addendum)
error
